# Patient Record
Sex: Female | Born: 1960
Health system: Southern US, Community
[De-identification: ages and names within clinical notes are randomized; demographics above are authoritative.]

## PROBLEM LIST (undated history)

## (undated) DIAGNOSIS — F32A Depression, unspecified: Secondary | ICD-10-CM

## (undated) DIAGNOSIS — Z974 Presence of external hearing-aid: Secondary | ICD-10-CM

## (undated) DIAGNOSIS — Z860101 Personal history of adenomatous and serrated colon polyps: Secondary | ICD-10-CM

## (undated) DIAGNOSIS — R748 Abnormal levels of other serum enzymes: Secondary | ICD-10-CM

## (undated) DIAGNOSIS — F319 Bipolar disorder, unspecified: Secondary | ICD-10-CM

## (undated) DIAGNOSIS — G5602 Carpal tunnel syndrome, left upper limb: Secondary | ICD-10-CM

## (undated) DIAGNOSIS — K8020 Calculus of gallbladder without cholecystitis without obstruction: Secondary | ICD-10-CM

## (undated) DIAGNOSIS — K760 Fatty (change of) liver, not elsewhere classified: Secondary | ICD-10-CM

## (undated) DIAGNOSIS — R5381 Other malaise: Secondary | ICD-10-CM

## (undated) DIAGNOSIS — M4802 Spinal stenosis, cervical region: Secondary | ICD-10-CM

## (undated) DIAGNOSIS — G629 Polyneuropathy, unspecified: Secondary | ICD-10-CM

## (undated) DIAGNOSIS — E119 Type 2 diabetes mellitus without complications: Secondary | ICD-10-CM

## (undated) DIAGNOSIS — I10 Essential (primary) hypertension: Secondary | ICD-10-CM

## (undated) DIAGNOSIS — R0789 Other chest pain: Secondary | ICD-10-CM

## (undated) DIAGNOSIS — F329 Major depressive disorder, single episode, unspecified: Secondary | ICD-10-CM

## (undated) DIAGNOSIS — K219 Gastro-esophageal reflux disease without esophagitis: Secondary | ICD-10-CM

## (undated) DIAGNOSIS — E785 Hyperlipidemia, unspecified: Secondary | ICD-10-CM

## (undated) DIAGNOSIS — M1990 Unspecified osteoarthritis, unspecified site: Secondary | ICD-10-CM

## (undated) DIAGNOSIS — J189 Pneumonia, unspecified organism: Secondary | ICD-10-CM

## (undated) DIAGNOSIS — F419 Anxiety disorder, unspecified: Secondary | ICD-10-CM

## (undated) DIAGNOSIS — H9190 Unspecified hearing loss, unspecified ear: Secondary | ICD-10-CM

## (undated) DIAGNOSIS — Z86010 Personal history of colonic polyps: Principal | ICD-10-CM

## (undated) DIAGNOSIS — R5383 Other fatigue: Secondary | ICD-10-CM

## (undated) DIAGNOSIS — E669 Obesity, unspecified: Secondary | ICD-10-CM

## (undated) HISTORY — DX: Essential (primary) hypertension: I10

## (undated) HISTORY — PX: UPPER GASTROINTESTINAL ENDOSCOPY: SHX188

## (undated) HISTORY — DX: Type 2 diabetes mellitus without complications: E11.9

## (undated) HISTORY — DX: Calculus of gallbladder without cholecystitis without obstruction: K80.20

## (undated) HISTORY — DX: Other malaise: R53.81

## (undated) HISTORY — DX: Carpal tunnel syndrome, left upper limb: G56.02

## (undated) HISTORY — DX: Major depressive disorder, single episode, unspecified: F32.9

## (undated) HISTORY — DX: Pneumonia, unspecified organism: J18.9

## (undated) HISTORY — DX: Personal history of colonic polyps: Z86.010

## (undated) HISTORY — DX: Gastro-esophageal reflux disease without esophagitis: K21.9

## (undated) HISTORY — DX: Hyperlipidemia, unspecified: E78.5

## (undated) HISTORY — DX: Anxiety disorder, unspecified: F41.9

## (undated) HISTORY — DX: Depression, unspecified: F32.A

## (undated) HISTORY — DX: Other chest pain: R07.89

## (undated) HISTORY — DX: Obesity, unspecified: E66.9

## (undated) HISTORY — DX: Other malaise: R53.83

## (undated) HISTORY — PX: POLYPECTOMY: SHX149

## (undated) HISTORY — DX: Unspecified osteoarthritis, unspecified site: M19.90

---

## 1996-04-04 HISTORY — PX: TUBAL LIGATION: SHX77

## 1998-04-04 HISTORY — PX: CHOLECYSTECTOMY: SHX55

## 2000-01-04 ENCOUNTER — Other Ambulatory Visit: Admission: RE | Admit: 2000-01-04 | Discharge: 2000-01-04 | Payer: Self-pay | Admitting: *Deleted

## 2000-10-27 ENCOUNTER — Ambulatory Visit (HOSPITAL_COMMUNITY): Admission: RE | Admit: 2000-10-27 | Discharge: 2000-10-27 | Payer: Self-pay | Admitting: Gastroenterology

## 2000-10-27 ENCOUNTER — Encounter: Payer: Self-pay | Admitting: Gastroenterology

## 2000-11-07 ENCOUNTER — Encounter: Payer: Self-pay | Admitting: Surgery

## 2000-11-07 ENCOUNTER — Ambulatory Visit (HOSPITAL_COMMUNITY): Admission: RE | Admit: 2000-11-07 | Discharge: 2000-11-07 | Payer: Self-pay | Admitting: Surgery

## 2000-11-27 ENCOUNTER — Observation Stay (HOSPITAL_COMMUNITY): Admission: RE | Admit: 2000-11-27 | Discharge: 2000-11-28 | Payer: Self-pay | Admitting: Surgery

## 2000-11-27 ENCOUNTER — Encounter (INDEPENDENT_AMBULATORY_CARE_PROVIDER_SITE_OTHER): Payer: Self-pay

## 2001-07-24 ENCOUNTER — Other Ambulatory Visit: Admission: RE | Admit: 2001-07-24 | Discharge: 2001-07-24 | Payer: Self-pay | Admitting: *Deleted

## 2002-01-21 ENCOUNTER — Other Ambulatory Visit (HOSPITAL_COMMUNITY): Admission: RE | Admit: 2002-01-21 | Discharge: 2002-02-01 | Payer: Self-pay | Admitting: Psychiatry

## 2008-01-04 ENCOUNTER — Emergency Department (HOSPITAL_COMMUNITY): Admission: EM | Admit: 2008-01-04 | Discharge: 2008-01-04 | Payer: Self-pay | Admitting: Emergency Medicine

## 2008-01-06 ENCOUNTER — Emergency Department (HOSPITAL_COMMUNITY): Admission: EM | Admit: 2008-01-06 | Discharge: 2008-01-06 | Payer: Self-pay | Admitting: Family Medicine

## 2008-07-31 ENCOUNTER — Encounter (INDEPENDENT_AMBULATORY_CARE_PROVIDER_SITE_OTHER): Payer: Self-pay | Admitting: *Deleted

## 2008-09-02 ENCOUNTER — Ambulatory Visit: Payer: Self-pay | Admitting: Family Medicine

## 2008-09-02 ENCOUNTER — Encounter: Payer: Self-pay | Admitting: Family Medicine

## 2008-09-02 DIAGNOSIS — F319 Bipolar disorder, unspecified: Secondary | ICD-10-CM | POA: Insufficient documentation

## 2008-09-02 DIAGNOSIS — R5383 Other fatigue: Secondary | ICD-10-CM

## 2008-09-02 DIAGNOSIS — R5381 Other malaise: Secondary | ICD-10-CM | POA: Insufficient documentation

## 2008-09-02 LAB — CONVERTED CEMR LAB
BUN: 10 mg/dL (ref 6–23)
CO2: 22 meq/L (ref 19–32)
Calcium: 10.1 mg/dL (ref 8.4–10.5)
Chloride: 104 meq/L (ref 96–112)
Creatinine, Ser: 0.67 mg/dL (ref 0.40–1.20)
Glucose, Bld: 87 mg/dL (ref 70–99)
HCT: 38 % (ref 36.0–46.0)
Hemoglobin: 13.1 g/dL (ref 12.0–15.0)
MCHC: 34.5 g/dL (ref 30.0–36.0)
MCV: 87.2 fL (ref 78.0–100.0)
Platelets: 282 10*3/uL (ref 150–400)
Potassium: 4 meq/L (ref 3.5–5.3)
RBC: 4.36 M/uL (ref 3.87–5.11)
RDW: 12.5 % (ref 11.5–15.5)
Sodium: 139 meq/L (ref 135–145)
TSH: 3.463 microintl units/mL (ref 0.350–4.500)
WBC: 8.7 10*3/uL (ref 4.0–10.5)

## 2008-09-04 ENCOUNTER — Encounter: Payer: Self-pay | Admitting: Family Medicine

## 2008-10-23 ENCOUNTER — Ambulatory Visit: Payer: Self-pay | Admitting: Family Medicine

## 2008-10-23 DIAGNOSIS — I10 Essential (primary) hypertension: Secondary | ICD-10-CM | POA: Insufficient documentation

## 2008-10-23 DIAGNOSIS — E669 Obesity, unspecified: Secondary | ICD-10-CM | POA: Insufficient documentation

## 2010-05-06 ENCOUNTER — Encounter: Payer: Self-pay | Admitting: *Deleted

## 2010-08-10 ENCOUNTER — Inpatient Hospital Stay (INDEPENDENT_AMBULATORY_CARE_PROVIDER_SITE_OTHER)
Admission: RE | Admit: 2010-08-10 | Discharge: 2010-08-10 | Disposition: A | Discharge status: Home / Self Care | Payer: Self-pay | Source: Ambulatory Visit | Attending: Family Medicine | Admitting: Family Medicine

## 2010-08-10 DIAGNOSIS — R209 Unspecified disturbances of skin sensation: Secondary | ICD-10-CM

## 2010-08-10 LAB — POCT I-STAT, CHEM 8
BUN: 7 mg/dL (ref 6–23)
Calcium, Ion: 1.21 mmol/L (ref 1.12–1.32)
Chloride: 104 mEq/L (ref 96–112)
Creatinine, Ser: 0.6 mg/dL (ref 0.4–1.2)
Glucose, Bld: 88 mg/dL (ref 70–99)
HCT: 39 % (ref 36.0–46.0)
Hemoglobin: 13.3 g/dL (ref 12.0–15.0)
Potassium: 3.2 mEq/L — ABNORMAL LOW (ref 3.5–5.1)
Sodium: 138 mEq/L (ref 135–145)
TCO2: 23 mmol/L (ref 0–100)

## 2010-08-20 NOTE — Op Note (Signed)
[facility]  Patient:    Claudia Gregory, Claudia Gregory Visit Number: [id number] MRN: [id number]          Service Type: SUR Location: [id number] Attending Physician:  Shelly Rubenstein Proc. Date: 11/27/00 Adm. Date:  11/27/2000                             Operative Report  PREOPERATIVE DIAGNOSIS:  Symptomatic cholelithiasis.  POSTOPERATIVE DIAGNOSIS:  Symptomatic cholelithiasis.  PROCEDURE:  Laparoscopic cholecystectomy.  SURGEON:  Abigail Miyamoto, M.D.  ASSISTANT:  Donnie Coffin. Samuella Cota, M.D.  ANESTHESIA:  General endotracheal.  ESTIMATED BLOOD LOSS:  Minimal.  DESCRIPTION OF PROCEDURE:  The patient was brought to the operating room and identified as [name].  She was placed supine on the operating room table, and general anesthesia was induced.  Next, her abdomen was then prepped and draped in the usual sterile fashion.  Using the #15 blade, a small, transverse incision was made in the umbilicus through the patients previous incision. Incision was carried through the fascia which was then opened with the scalpel.  A hemostat was then used to pass into the peritoneal cavity.  Next, a 0 Vicryl pursestring suture was placed around the fascial opening.  The Hasson port was then placed through the opening, and insufflation of the abdomen was begun.  Next, an 11 mm port was placed in the patients epigastrium, and two 5 mm ports were placed in the patients right flank under direct vision.  The gallbladder was identified and grasped and retracted above the liver bed.  Several adhesions to the gallbladder were then taken down bluntly.  The cystic duct was then dissected out and clipped three times proximally and once distally and transected with the scissors.  The cystic artery was then identified and clipped twice proximally and once distally and transected as well.  The gallbladder was then slowly dissected free from the liver bed.  A small branching vessel in the  liver bed near the dome of the gallbladder was stapled as well.  Once the gallbladder was completely excised from the liver bed, hemostasis appeared to be achieved.  The gallbladder was then removed through the incision at the umbilicus.  The 0 Vicryl at the umbilicus was then tied in place, closing the fascial defect.  The abdomen was then irrigated with normal saline.  Again, hemostasis appeared to be achieved. All ports were then removed under direct vision, and the abdomen was deflated. All incisions were then anesthetized with 0.25% Marcaine and then closed with 4-0 Monocryl subcuticular sutures.  Steri-Strips, gauze, and Tegaderm were then applied.  The patient tolerated the procedure well.  All sponge, needle and instrument counts were correct at the end of the procedure.  The patient was then extubated in the operating room and taken in stable condition to the recovery room. Attending Physician:  Shelly Rubenstein DD:  11/27/00 TD:  11/27/00 Job: [id number]

## 2011-07-15 ENCOUNTER — Encounter (HOSPITAL_COMMUNITY): Payer: Self-pay | Admitting: *Deleted

## 2011-07-15 ENCOUNTER — Emergency Department (HOSPITAL_COMMUNITY)
Admission: EM | Admit: 2011-07-15 | Discharge: 2011-07-15 | Disposition: A | Discharge status: Home / Self Care | Payer: 59 | Attending: Emergency Medicine | Admitting: Emergency Medicine

## 2011-07-15 ENCOUNTER — Emergency Department (HOSPITAL_COMMUNITY): Payer: 59

## 2011-07-15 DIAGNOSIS — M25519 Pain in unspecified shoulder: Secondary | ICD-10-CM | POA: Insufficient documentation

## 2011-07-15 DIAGNOSIS — I498 Other specified cardiac arrhythmias: Secondary | ICD-10-CM | POA: Insufficient documentation

## 2011-07-15 DIAGNOSIS — F439 Reaction to severe stress, unspecified: Secondary | ICD-10-CM

## 2011-07-15 DIAGNOSIS — R079 Chest pain, unspecified: Secondary | ICD-10-CM | POA: Insufficient documentation

## 2011-07-15 DIAGNOSIS — Z733 Stress, not elsewhere classified: Secondary | ICD-10-CM | POA: Insufficient documentation

## 2011-07-15 DIAGNOSIS — I10 Essential (primary) hypertension: Secondary | ICD-10-CM | POA: Insufficient documentation

## 2011-07-15 DIAGNOSIS — H9190 Unspecified hearing loss, unspecified ear: Secondary | ICD-10-CM | POA: Insufficient documentation

## 2011-07-15 HISTORY — DX: Unspecified hearing loss, unspecified ear: H91.90

## 2011-07-15 LAB — POCT I-STAT, CHEM 8
BUN: 10 mg/dL (ref 6–23)
Calcium, Ion: 1.33 mmol/L — ABNORMAL HIGH (ref 1.12–1.32)
Chloride: 105 mEq/L (ref 96–112)
Glucose, Bld: 97 mg/dL (ref 70–99)
TCO2: 25 mmol/L (ref 0–100)

## 2011-07-15 LAB — CBC
HCT: 34.7 % — ABNORMAL LOW (ref 36.0–46.0)
MCH: 29.8 pg (ref 26.0–34.0)
MCV: 84.6 fL (ref 78.0–100.0)
Platelets: 273 10*3/uL (ref 150–400)
RDW: 12.3 % (ref 11.5–15.5)
WBC: 7.1 10*3/uL (ref 4.0–10.5)

## 2011-07-15 LAB — POCT I-STAT TROPONIN I: Troponin i, poc: 0.02 ng/mL (ref 0.00–0.08)

## 2011-07-15 MED ORDER — ZOLPIDEM TARTRATE 5 MG PO TABS: 5.0000 mg | ORAL_TABLET | Freq: Every evening | ORAL | Status: DC | PRN

## 2011-07-15 MED ORDER — HYDROCHLOROTHIAZIDE 25 MG PO TABS: 25.0000 mg | ORAL_TABLET | Freq: Every day | ORAL | Status: DC

## 2011-07-15 NOTE — ED Notes (Signed)
Recently two family members past away and son was arrested one week ago. States been under a lot of stress and last week right sided chest pain radiating to center of chest and full feeling in neck and face with headache. Continued this week and pain currently 5/10 pressure. Airway intact bilateral equal chest rise and fall.  Denies shortness of breath. Resting comfortably on stretcher.  Boyfriend at bedside along with interpretor because patient is deaf.  Patient states unknown if she always has a low heart rate.

## 2011-07-15 NOTE — ED Provider Notes (Signed)
History     CSN: [id number]  Arrival date & time 07/15/11  1409   First MD Initiated Contact with Patient 07/15/11 1956      Chief Complaint  Patient presents with  . Chest Pain  . Headache  . Hypertension  . Blurred Vision     HPI Pt reports right sided chest pain radiating into right shoulder with sob and feeling of being flushed. Pt reports headache, increased blood pressure, and and blurred vision. Stroke scale negative. Pt reports a lot of stress in her life lately. Pt is deaf and pen and paper used to communicate with pt. Pt is aware of poc  Past Medical History  Diagnosis Date  . Deaf     History reviewed. No pertinent past surgical history.  History reviewed. No pertinent family history.  History  Substance Use Topics  . Smoking status: Never Smoker   . Smokeless tobacco: Not on file  . Alcohol Use: No    OB History    Grav Para Term Preterm Abortions TAB SAB Ect Mult Living                  Review of Systems  Unable to perform ROS: Other  All other systems reviewed and are negative.    Allergies  Review of patient's allergies indicates no known allergies.  Home Medications   Current Outpatient Rx  Name Route Sig Dispense Refill  . BUPROPION HCL ER (SR) 150 MG PO TB12 Oral Take 150 mg by mouth 2 (two) times daily.    Marland Kitchen CLONAZEPAM 1 MG PO TABS Oral Take 1 mg by mouth 4 (four) times daily as needed. For anxiety    . FLUOXETINE HCL 10 MG PO CAPS Oral Take 20 mg by mouth daily.    Marland Kitchen RISPERIDONE 1 MG PO TABS Oral Take 1 mg by mouth at bedtime.    Marland Kitchen HYDROCHLOROTHIAZIDE 25 MG PO TABS Oral Take 1 tablet (25 mg total) by mouth daily. 30 tablet 0  . ZOLPIDEM TARTRATE 5 MG PO TABS Oral Take 1 tablet (5 mg total) by mouth at bedtime as needed for sleep. 15 tablet 0    BP 164/84  Pulse 50  Temp(Src) 97.1 F (36.2 C) (Oral)  Resp 16  Wt 173 lb 6 oz (78.642 kg)  SpO2 99%  Physical Exam  Nursing note and vitals reviewed. Constitutional: She is oriented  to person, place, and time. She appears well-developed and well-nourished. No distress.  HENT:  Head: Normocephalic and atraumatic.  Eyes: Pupils are equal, round, and reactive to light.  Neck: Normal range of motion.  Cardiovascular: Intact distal pulses.  Bradycardia present.        Sinus bradycardia Rate =54 Possible Anterior infarct , age undetermined Abnormal ECG No previous ECG  Pulmonary/Chest: No respiratory distress.  Abdominal: Normal appearance. She exhibits no distension.  Musculoskeletal: Normal range of motion.  Neurological: She is alert and oriented to person, place, and time. She has normal strength. No cranial nerve deficit or sensory deficit. GCS eye subscore is 4. GCS verbal subscore is 5. GCS motor subscore is 6.  Skin: Skin is warm and dry. No rash noted.  Psychiatric: She has a normal mood and affect. Her behavior is normal.    ED Course  Procedures (including critical care time)  Labs Reviewed  CBC - Abnormal; Notable for the following:    HCT 34.7 (*)    All other components within normal limits  POCT I-STAT, CHEM 8 -  Abnormal; Notable for the following:    Calcium, Ion 1.33 (*)    Hemoglobin 11.9 (*)    HCT 35.0 (*)    All other components within normal limits  POCT I-STAT TROPONIN I   Dg Chest 2 View  07/15/2011  *RADIOLOGY REPORT*  Clinical Data: 51 year old female with chest pain, headache, blurred vision, right shoulder pain.  CHEST - 2 VIEW  Comparison: 01/04/2008.  Findings: Interval clearance of the patchy bilateral airspace disease seen on the previous exam.  Better lung volumes.  Cardiac size remains at the upper limits of normal. Other mediastinal contours are within normal limits.  Visualized tracheal air column is within normal limits.  No pneumothorax, pulmonary edema, pleural effusion or confluent pulmonary opacity.  Right upper quadrant surgical clips.  Negative visualized bowel gas. No acute osseous abnormality identified.  IMPRESSION: No  acute cardiopulmonary abnormality.  Original Report Authenticated By: Harley Hallmark, M.D.     1. Hypertension   2. Stress       MDM         Nelia Shi, MD 07/15/11 2007

## 2011-07-15 NOTE — ED Notes (Signed)
Patient is deaf,  She went to pediatrics before the adult triage.  She has returned to be triaged.  We have called for a deaf interpreter

## 2011-07-15 NOTE — ED Notes (Signed)
PT ambulated with a steady gait; VSS; A&Ox3; no signs of distress; respirations even and unlabored; skin warm and dry. NO questions at this time.  

## 2011-07-15 NOTE — ED Notes (Signed)
PT reports only a headache. NO chest pain at this time.

## 2011-07-15 NOTE — ED Notes (Signed)
FAMILY MEMBER AUNT Olegario Messier [phone] CALL FOR UPDATES.

## 2011-07-15 NOTE — Discharge Instructions (Signed)
Arterial Hypertension Arterial hypertension (high blood pressure) is a condition of elevated pressure in your blood vessels. Hypertension over a long period of time is a risk factor for strokes, heart attacks, and heart failure. It is also the leading cause of kidney (renal) failure.  CAUSES   In Adults -- Over 90% of all hypertension has no known cause. This is called essential or primary hypertension. In the other 10% of people with hypertension, the increase in blood pressure is caused by another disorder. This is called secondary hypertension. Important causes of secondary hypertension are:   Heavy alcohol use.   Obstructive sleep apnea.   Hyperaldosterosim (Conn's syndrome).   Steroid use.   Chronic kidney failure.   Hyperparathyroidism.   Medications.   Renal artery stenosis.   Pheochromocytoma.   Cushing's disease.   Coarctation of the aorta.   Scleroderma renal crisis.   Licorice (in excessive amounts).   Drugs (cocaine, methamphetamine).  Your caregiver can explain any items above that apply to you.  In Children -- Secondary hypertension is more common and should always be considered.   Pregnancy -- Few women of childbearing age have high blood pressure. However, up to 10% of them develop hypertension of pregnancy. Generally, this will not harm the woman. It may be a sign of 3 complications of pregnancy: preeclampsia, HELLP syndrome, and eclampsia. Follow up and control with medication is necessary.  SYMPTOMS   This condition normally does not produce any noticeable symptoms. It is usually found during a routine exam.   Malignant hypertension is a late problem of high blood pressure. It may have the following symptoms:   Headaches.   Blurred vision.   End-organ damage (this means your kidneys, heart, lungs, and other organs are being damaged).   Stressful situations can increase the blood pressure. If a person with normal blood pressure has their blood  pressure go up while being seen by their caregiver, this is often termed "white coat hypertension." Its importance is not known. It may be related with eventually developing hypertension or complications of hypertension.   Hypertension is often confused with mental tension, stress, and anxiety.  DIAGNOSIS  The diagnosis is made by 3 separate blood pressure measurements. They are taken at least 1 week apart from each other. If there is organ damage from hypertension, the diagnosis may be made without repeat measurements. Hypertension is usually identified by having blood pressure readings:  Above 140/90 mmHg measured in both arms, at 3 separate times, over a couple weeks.   Over 130/80 mmHg should be considered a risk factor and may require treatment in patients with diabetes.  Blood pressure readings over 120/80 mmHg are called "pre-hypertension" even in non-diabetic patients. To get a true blood pressure measurement, use the following guidelines. Be aware of the factors that can alter blood pressure readings.  Take measurements at least 1 hour after caffeine.   Take measurements 30 minutes after smoking and without any stress. This is another reason to quit smoking - it raises your blood pressure.   Use a proper cuff size. Ask your caregiver if you are not sure about your cuff size.   Most home blood pressure cuffs are automatic. They will measure systolic and diastolic pressures. The systolic pressure is the pressure reading at the start of sounds. Diastolic pressure is the pressure at which the sounds disappear. If you are elderly, measure pressures in multiple postures. Try sitting, lying or standing.   Sit at rest for a minimum of   5 minutes before taking measurements.   You should not be on any medications like decongestants. These are found in many cold medications.   Record your blood pressure readings and review them with your caregiver.  If you have hypertension:  Your caregiver  may do tests to be sure you do not have secondary hypertension (see "causes" above).   Your caregiver may also look for signs of metabolic syndrome. This is also called Syndrome X or Insulin Resistance Syndrome. You may have this syndrome if you have type 2 diabetes, abdominal obesity, and abnormal blood lipids in addition to hypertension.   Your caregiver will take your medical and family history and perform a physical exam.   Diagnostic tests may include blood tests (for glucose, cholesterol, potassium, and kidney function), a urinalysis, or an EKG. Other tests may also be necessary depending on your condition.  PREVENTION  There are important lifestyle issues that you can adopt to reduce your chance of developing hypertension:  Maintain a normal weight.   Limit the amount of salt (sodium) in your diet.   Exercise often.   Limit alcohol intake.   Get enough potassium in your diet. Discuss specific advice with your caregiver.   Follow a DASH diet (dietary approaches to stop hypertension). This diet is rich in fruits, vegetables, and low-fat dairy products, and avoids certain fats.  PROGNOSIS  Essential hypertension cannot be cured. Lifestyle changes and medical treatment can lower blood pressure and reduce complications. The prognosis of secondary hypertension depends on the underlying cause. Many people whose hypertension is controlled with medicine or lifestyle changes can live a normal, healthy life.  RISKS AND COMPLICATIONS  While high blood pressure alone is not an illness, it often requires treatment due to its short- and long-term effects on many organs. Hypertension increases your risk for:  CVAs or strokes (cerebrovascular accident).   Heart failure due to chronically high blood pressure (hypertensive cardiomyopathy).   Heart attack (myocardial infarction).   Damage to the retina (hypertensive retinopathy).   Kidney failure (hypertensive nephropathy).  Your caregiver can  explain list items above that apply to you. Treatment of hypertension can significantly reduce the risk of complications. TREATMENT   For overweight patients, weight loss and regular exercise are recommended. Physical fitness lowers blood pressure.   Mild hypertension is usually treated with diet and exercise. A diet rich in fruits and vegetables, fat-free dairy products, and foods low in fat and salt (sodium) can help lower blood pressure. Decreasing salt intake decreases blood pressure in a 1/3 of people.   Stop smoking if you are a smoker.  The steps above are highly effective in reducing blood pressure. While these actions are easy to suggest, they are difficult to achieve. Most patients with moderate or severe hypertension end up requiring medications to bring their blood pressure down to a normal level. There are several classes of medications for treatment. Blood pressure pills (antihypertensives) will lower blood pressure by their different actions. Lowering the blood pressure by 10 mmHg may decrease the risk of complications by as much as 25%. The goal of treatment is effective blood pressure control. This will reduce your risk for complications. Your caregiver will help you determine the best treatment for you according to your lifestyle. What is excellent treatment for one person, may not be for you. HOME CARE INSTRUCTIONS   Do not smoke.   Follow the lifestyle changes outlined in the "Prevention" section.   If you are on medications, follow the directions   carefully. Blood pressure medications must be taken as prescribed. Skipping doses reduces their benefit. It also puts you at risk for problems.   Follow up with your caregiver, as directed.   If you are asked to monitor your blood pressure at home, follow the guidelines in the "Diagnosis" section above.  SEEK MEDICAL CARE IF:   You think you are having medication side effects.   You have recurrent headaches or lightheadedness.     You have swelling in your ankles.   You have trouble with your vision.  SEEK IMMEDIATE MEDICAL CARE IF:   You have sudden onset of chest pain or pressure, difficulty breathing, or other symptoms of a heart attack.   You have a severe headache.   You have symptoms of a stroke (such as sudden weakness, difficulty speaking, difficulty walking).  MAKE SURE YOU:   Understand these instructions.   Will watch your condition.   Will get help right away if you are not doing well or get worse.  Document Released: 03/21/2005 Document Revised: 03/10/2011 Document Reviewed: 10/19/2006 [employer] Patient Information 2012 [employer]. 

## 2011-07-15 NOTE — ED Notes (Signed)
Pt reports right sided chest pain radiating into right shoulder with sob and feeling of being flushed. Pt reports headache, increased blood pressure, and and blurred vision. Stroke scale negative. Pt reports a lot of stress in her life lately. Pt is deaf and pen and paper used to communicate with pt. Pt is aware of poc.

## 2011-08-10 ENCOUNTER — Ambulatory Visit: Payer: 59 | Admitting: Family Medicine

## 2011-08-12 ENCOUNTER — Ambulatory Visit (INDEPENDENT_AMBULATORY_CARE_PROVIDER_SITE_OTHER): Payer: 59 | Admitting: Family Medicine

## 2011-08-12 ENCOUNTER — Encounter: Payer: Self-pay | Admitting: Family Medicine

## 2011-08-12 VITALS — BP 132/90 | HR 64 | Temp 98.4°F | Ht 66.0 in | Wt 171.0 lb

## 2011-08-12 DIAGNOSIS — R030 Elevated blood-pressure reading, without diagnosis of hypertension: Secondary | ICD-10-CM

## 2011-08-12 DIAGNOSIS — G5602 Carpal tunnel syndrome, left upper limb: Secondary | ICD-10-CM

## 2011-08-12 DIAGNOSIS — R0789 Other chest pain: Secondary | ICD-10-CM

## 2011-08-12 DIAGNOSIS — G5600 Carpal tunnel syndrome, unspecified upper limb: Secondary | ICD-10-CM

## 2011-08-12 DIAGNOSIS — F319 Bipolar disorder, unspecified: Secondary | ICD-10-CM

## 2011-08-12 MED ORDER — HYDROCHLOROTHIAZIDE 25 MG PO TABS: 25.0000 mg | ORAL_TABLET | Freq: Every day | ORAL | Status: DC

## 2011-08-12 NOTE — Assessment & Plan Note (Signed)
Mood disorder slightly worsening with her mother's death.  Plan to encourage continued counseling and followup at [facility]. Discussed suicidal precautions. Patient expresses understanding

## 2011-08-12 NOTE — Progress Notes (Signed)
Claudia Gregory is a 51 y.o. female who presents to [facility] today for   1) chest pain followup: Patient had right-sided chest pain for about one month and was recently seen in the emergency room. She had an EKG and negative cardiac enzymes.  She notes that she has been emotionally worse recently. Her mother died in 2022/06/08 and mother state is coming up.  He notes that she experienced chart right-sided nonradiating chest pain after crying. She denies any exertion palpitations or trouble breathing associated with chest pain.  She currently is taking hydrochlorothiazide for blood pressure.   2) mood: Patient has bipolar disorder which is currently treated with the medications listed below at [facility].  She notes that her mood has been more depressed recently after her mother's death.  She denies any suicidal or homicidal ideation. She goes to therapy on Mondays at [facility].  She has not been for the last 2 weeks.    3) numbness of the left fingers: Patient notes occasional numb tingling feeling of the left fingertips of the second, third, and fourth digits.  She denies any weakness in her hand or pain. She notes the pain after driving for an extended period of time. She works doing Estate manager/land agent work.  She has not been evaluated for this in the past. Of note the patient is deaf and uses American sign language to communicate.  PMH: Reviewed significant for bipolar, hypertension, deafness History  Substance Use Topics  . Smoking status: Never Smoker   . Smokeless tobacco: Not on file  . Alcohol Use: No   ROS as above  Medications reviewed. Current Outpatient Prescriptions  Medication Sig Dispense Refill  . buPROPion (WELLBUTRIN SR) 150 MG 12 hr tablet Take 150 mg by mouth 2 (two) times daily.      . clonazePAM (KLONOPIN) 1 MG tablet Take 1 mg by mouth 4 (four) times daily as needed. For anxiety      . FLUoxetine (PROZAC) 10 MG capsule Take 20 mg by mouth daily.      . hydrochlorothiazide (HYDRODIURIL)  25 MG tablet Take 1 tablet (25 mg total) by mouth daily.  30 tablet  6  . risperiDONE (RISPERDAL) 1 MG tablet Take 1 mg by mouth at bedtime.      Marland Kitchen DISCONTD: hydrochlorothiazide (HYDRODIURIL) 25 MG tablet Take 1 tablet (25 mg total) by mouth daily.  30 tablet  0  . zolpidem (AMBIEN) 5 MG tablet Take 1 tablet (5 mg total) by mouth at bedtime as needed for sleep.  15 tablet  0    Exam:  BP 132/90  Pulse 64  Temp(Src) 98.4 F (36.9 C) (Oral)  Ht 5\' 6"  (1.676 m)  Wt 171 lb (77.565 kg)  BMI 27.60 kg/m2 Gen: Well NAD HEENT: EOMI,  MMM Lungs: CTABL Nl WOB Heart: RRR no MRG Abd: NABS, NT, ND Exts: Non edematous BL  LE, warm and well perfused. Positive Tinel's and Phalen's test of the left hand.  Strength and motion is intact.  Data reviewed below from April 12   Chemistry      Component Value Date/Time   NA 139 07/15/2011 1547   K 3.9 07/15/2011 1547   CL 105 07/15/2011 1547   CO2 22 09/02/2008 2246   BUN 10 07/15/2011 1547   CREATININE 0.70 07/15/2011 1547      Component Value Date/Time   CALCIUM 10.1 09/02/2008 2246     Point-of-care troponin 0.02. Hemoglobin 11.9. EKG: Sinus bradycardia with possible old inferior infarct.  Dg Chest 2 View  07/15/2011  *RADIOLOGY REPORT*  Clinical Data: 51 year old female with chest pain, headache, blurred vision, right shoulder pain.  CHEST - 2 VIEW  Comparison: 01/04/2008.  Findings: Interval clearance of the patchy bilateral airspace disease seen on the previous exam.  Better lung volumes.  Cardiac size remains at the upper limits of normal. Other mediastinal contours are within normal limits.  Visualized tracheal air column is within normal limits.  No pneumothorax, pulmonary edema, pleural effusion or confluent pulmonary opacity.  Right upper quadrant surgical clips.  Negative visualized bowel gas. No acute osseous abnormality identified.  IMPRESSION: No acute cardiopulmonary abnormality.  Original Report Authenticated By: Harley Hallmark, M.D.

## 2011-08-12 NOTE — Patient Instructions (Signed)
Thank you for coming in today. We will be in touch about your cardiology appointment. Please make an appointment for the lab next week for fasting lab.  Do not eat or drink anything other than water before you come.  Please return within 3 months for a new appointment.  Come back sooner if you get worse.  I would recommend that you continue going to counseling.  I think your hand numbness is due to carpal tunnel syndrome.  We can treat it if it gets worse. Call or go to the emergency room if you get worse, have trouble breathing, have chest pains, or palpitations.   Chest Pain (Nonspecific) It is often hard to give a specific diagnosis for the cause of chest pain. There is always a chance that your pain could be related to something serious, such as a heart attack or a blood clot in the lungs. You need to follow up with your caregiver for further evaluation. CAUSES    Heartburn.   Pneumonia or bronchitis.   Anxiety or stress.   Inflammation around your heart (pericarditis) or lung (pleuritis or pleurisy).   A blood clot in the lung.   A collapsed lung (pneumothorax). It can develop suddenly on its own (spontaneous pneumothorax) or from injury (trauma) to the chest.   Shingles infection (herpes zoster virus).  The chest wall is composed of bones, muscles, and cartilage. Any of these can be the source of the pain.  The bones can be bruised by injury.   The muscles or cartilage can be strained by coughing or overwork.   The cartilage can be affected by inflammation and become sore (costochondritis).  DIAGNOSIS   Lab tests or other studies, such as X-rays, electrocardiography, stress testing, or cardiac imaging, may be needed to find the cause of your pain.   TREATMENT    Treatment depends on what may be causing your chest pain. Treatment may include:   Acid blockers for heartburn.   Anti-inflammatory medicine.   Pain medicine for inflammatory conditions.   Antibiotics if an  infection is present.   You may be advised to change lifestyle habits. This includes stopping smoking and avoiding alcohol, caffeine, and chocolate.   You may be advised to keep your head raised (elevated) when sleeping. This reduces the chance of acid going backward from your stomach into your esophagus.   Most of the time, nonspecific chest pain will improve within 2 to 3 days with rest and mild pain medicine.  HOME CARE INSTRUCTIONS    If antibiotics were prescribed, take your antibiotics as directed. Finish them even if you start to feel better.   For the next few days, avoid physical activities that bring on chest pain. Continue physical activities as directed.   Do not smoke.   Avoid drinking alcohol.   Only take over-the-counter or prescription medicine for pain, discomfort, or fever as directed by your caregiver.   Follow your caregiver's suggestions for further testing if your chest pain does not go away.   Keep any follow-up appointments you made. If you do not go to an appointment, you could develop lasting (chronic) problems with pain. If there is any problem keeping an appointment, you must call to reschedule.  SEEK MEDICAL CARE IF:    You think you are having problems from the medicine you are taking. Read your medicine instructions carefully.   Your chest pain does not go away, even after treatment.   You develop a rash with blisters on  your chest.  SEEK IMMEDIATE MEDICAL CARE IF:    You have increased chest pain or pain that spreads to your arm, neck, jaw, back, or abdomen.   You develop shortness of breath, an increasing cough, or you are coughing up blood.   You have severe back or abdominal pain, feel nauseous, or vomit.   You develop severe weakness, fainting, or chills.   You have a fever.  THIS IS AN EMERGENCY. Do not wait to see if the pain will go away. Get medical help at once. Call your local emergency services (911 in [place] Do not drive yourself to  the hospital. MAKE SURE YOU:    Understand these instructions.   Will watch your condition.   Will get help right away if you are not doing well or get worse.  Document Released: 12/29/2004 Document Revised: 03/10/2011 Document Reviewed: 10/25/2007 [facility] Patient Information 2012 [place], [place].  Carpal Tunnel Syndrome The carpal tunnel is a narrow hollow area in the wrist. It is formed by the wrist bones and ligaments. Nerves, blood vessels, and tendons (cord like structures which attach muscle to bone) on the palm side (the side of your hand in the direction your fingers bend) of your hand pass through the carpal tunnel. Repeated wrist motion or certain diseases may cause swelling within the tunnel. (That is why these are called repetitive trauma (damage caused by over use) disorders. It is also a common problem in late pregnancy.) This swelling pinches the main nerve in the wrist (median nerve) and causes the painful condition called carpal tunnel syndrome. A feeling of "pins and needles" may be noticed in the fingers or hand; however, the entire arm may ache from this condition. Carpal tunnel syndrome may clear up by itself. Cortisone injections may help. Sometimes, an operation may be needed to free the pinched nerve. An electromyogram (a type of test) may be needed to confirm this diagnosis (learning what is wrong). This is a test which measures nerve conduction. The nerve conduction is usually slowed in a carpal tunnel syndrome. HOME CARE INSTRUCTIONS    If your caregiver prescribed medication to help reduce swelling, take as directed.   If you were given a splint to keep your wrist from bending, use it as instructed. It is important to wear the splint at night. Use the splint for as long as you have pain or numbness in your hand, arm or wrist. This may take 1 to 2 months.   If you have pain at night, it may help to rub or shake your hand, or elevate your hand above the level of your  heart (the center of your chest).   It is important to give your wrist a rest by stopping the activities that are causing the problem. If your symptoms (problems) are work-related, you may need to talk to your employer about changing to a job that does not require using your wrist.   Only take over-the-counter or prescription medicines for pain, discomfort, or fever as directed by your caregiver.   Following periods of extended use, particularly strenuous use, apply an ice pack wrapped in a towel to the anterior (palm) side of the affected wrist for 20 to 30 minutes. Repeat as needed three to four times per day. This will help reduce the swelling.   Follow all instructions for follow-up with your caregiver. This includes any orthopedic referrals, physical therapy, and rehabilitation. Any delay in obtaining necessary care could result in a delay or failure of  your condition to heal.  SEEK IMMEDIATE MEDICAL CARE IF:    You are still having pain and numbness following a week of treatment.   You develop new, unexplained symptoms.   Your current symptoms are getting worse and are not helped or controlled with medications.  MAKE SURE YOU:    Understand these instructions.   Will watch your condition.   Will get help right away if you are not doing well or get worse.  Document Released: 03/18/2000 Document Revised: 03/10/2011 Document Reviewed: 02/04/2011 [facility] Patient Information 2012 [place], [place].

## 2011-08-12 NOTE — Assessment & Plan Note (Signed)
Patient's blood pressure is well controlled with hydrochlorothiazide. Her potassium level was recently normal in April. Plan to continue hydrochlorothiazide and followup in about 1-3 months

## 2011-08-12 NOTE — Assessment & Plan Note (Signed)
Patient likely has carpal tunnel syndrome of the left wrist.  It appears to be very mild at this time. I feel that using a wrist brace with exacerbating activities as the right approach at this time.  Followup as needed

## 2011-08-12 NOTE — Assessment & Plan Note (Signed)
Patient has what appears to be atypical chest pain worsened with emotional disturbance. She has a mildly abnormal EKG with normal electrolytes and cardiac enzymes.  Her chest exam today is not remarkable. I do feel that she should have further risk factor stratification.  Plan to obtain fasting lipids and refer to cardiology for stress testing.  I'm not sure she would benefit from a nuclear stress test versus exercise stress echo/EKG, therefore feel that referral is beneficial.  Plan to followup with myself in 1-3 months or sooner if worsening

## 2011-09-05 ENCOUNTER — Encounter: Payer: Self-pay | Admitting: *Deleted

## 2011-09-06 ENCOUNTER — Ambulatory Visit (INDEPENDENT_AMBULATORY_CARE_PROVIDER_SITE_OTHER): Payer: 59 | Admitting: Cardiovascular Disease

## 2011-09-06 ENCOUNTER — Encounter: Payer: Self-pay | Admitting: Cardiovascular Disease

## 2011-09-06 VITALS — BP 147/86 | HR 58 | Ht 66.0 in | Wt 173.1 lb

## 2011-09-06 DIAGNOSIS — I10 Essential (primary) hypertension: Secondary | ICD-10-CM

## 2011-09-06 DIAGNOSIS — R079 Chest pain, unspecified: Secondary | ICD-10-CM

## 2011-09-06 DIAGNOSIS — R0789 Other chest pain: Secondary | ICD-10-CM

## 2011-09-06 NOTE — Patient Instructions (Signed)

## 2011-09-06 NOTE — Assessment & Plan Note (Signed)
Stable continue diuretic

## 2011-09-06 NOTE — Progress Notes (Signed)
Patient ID: Claudia Gregory, female   DOB: April 07, 1960, 51 y.o.   MRN: [id number] 51 yo referred by [name] for chest pain.  Patient had right-sided chest pain for about one month and was  Seen in ER 4/13 Notes reviewed  She had an EKG and negative cardiac enzymes. She notes that she has been emotionally worse recently. Her mother died in 2022/06/29 and mother state is coming up. He notes that she experienced chart right-sided nonradiating chest pain after crying. She denies any exertion palpitations or trouble breathing associated with chest pain. She currently is taking hydrochlorothiazide for blood pressure. Had some recurrent atypical right sided pain last week.  No pleuritic component.  NO cough or fever. No recent trauma or arthitides  ROS: Denies fever, malais, weight loss, blurry vision, decreased visual acuity, cough, sputum, SOB, hemoptysis, pleuritic pain, palpitaitons, heartburn, abdominal pain, melena, lower extremity edema, claudication, or rash.  All other systems reviewed and negative  General: Affect appropriate Healthy:  appears stated age HEENT: normal Neck supple with no adenopathy JVP normal no bruits no thyromegaly Lungs clear with no wheezing and good diaphragmatic motion Heart:  S1/S2 no murmur, no rub, gallop or click PMI normal Abdomen: benighn, BS positve, no tenderness, no AAA no bruit.  No HSM or HJR Distal pulses intact with no bruits No edema Neuro non-focal Skin warm and dry No muscular weakness   Current Outpatient Prescriptions  Medication Sig Dispense Refill  . buPROPion (WELLBUTRIN SR) 150 MG 12 hr tablet Take 150 mg by mouth 2 (two) times daily.      . clonazePAM (KLONOPIN) 1 MG tablet Take 1 mg by mouth 4 (four) times daily as needed. For anxiety      . FLUoxetine (PROZAC) 10 MG capsule Take 20 mg by mouth daily.      . hydrochlorothiazide (HYDRODIURIL) 25 MG tablet Take 1 tablet (25 mg total) by mouth daily.  30 tablet  6  . risperiDONE (RISPERDAL) 1  MG tablet Take 1 mg by mouth at bedtime.        Allergies  Review of patient's allergies indicates no known allergies.  Electrocardiogram:  NSR rate 51 poor R wave progression  Assessment and Plan

## 2011-09-06 NOTE — Assessment & Plan Note (Signed)
Doubt cardiac Normal ECG and exam  F/U ETT

## 2011-09-14 ENCOUNTER — Encounter: Payer: 59 | Admitting: Physician Assistant

## 2011-09-21 ENCOUNTER — Encounter: Payer: Self-pay | Admitting: Physician Assistant

## 2011-09-21 ENCOUNTER — Ambulatory Visit (INDEPENDENT_AMBULATORY_CARE_PROVIDER_SITE_OTHER): Payer: 59 | Admitting: Physician Assistant

## 2011-09-21 DIAGNOSIS — R079 Chest pain, unspecified: Secondary | ICD-10-CM

## 2011-09-21 NOTE — Patient Instructions (Addendum)
Your physician has requested that you have a lexiscan myoview DX CHEST PAIN. For further information please visit [email or web address] Please follow instruction sheet, as given.

## 2011-09-21 NOTE — Procedures (Signed)
Exercise Treadmill Test  Pre-Exercise Testing Evaluation Rhythm: normal sinus  Rate: 63   PR:  .15 QRS:  .09  QT:  .43 QTc: .44     Test  Exercise Tolerance Test Ordering MD: Charlton Haws, MD  Interpreting MD: Tereso Newcomer PA-C  Unique Test No: 1  Treadmill:  1  Indication for ETT: chest pain - rule out ischemia  Contraindication to ETT: No   Stress Modality: exercise - treadmill  Cardiac Imaging Performed: non   Protocol: standard Bruce - maximal  Max BP: 165/84  Max MPHR (bpm):  172 85% MPR (bpm):  146  MPHR obtained (bpm):  121 % MPHR obtained:  72%  Reached 85% MPHR (min:sec):  n/a Total Exercise Time (min-sec):  3:48  Workload in METS:  4.8 Borg Scale: 13  Reason ETT Terminated:  patient's desire to stop    ST Segment Analysis At Rest: normal ST segments - no evidence of significant ST depression With Exercise: non-specific ST changes  Other Information Arrhythmia:  Frequent PVCs; one Ventricular Couplet Angina during ETT:  absent (0) Quality of ETT:  non-diagnostic  ETT Interpretation:  normal - no evidence of ischemia by ST analysis at submaximal exercise  Comments: Poor exercise tolerance. No chest pain. Normal BP response to exercise. No ST changes at submaximal exercise. Frequent PVCs and one ventricular couplet noted.  Recommendations: Arrange Lexiscan Myoview. Tereso Newcomer, PA-C  2:54 PM 09/21/2011

## 2011-09-29 ENCOUNTER — Emergency Department (HOSPITAL_COMMUNITY)
Admission: EM | Admit: 2011-09-29 | Discharge: 2011-09-29 | Disposition: A | Discharge status: Home Health | Payer: Medicare Other | Source: Home / Self Care | Attending: Family Medicine | Admitting: Family Medicine

## 2011-09-29 ENCOUNTER — Encounter (HOSPITAL_COMMUNITY): Payer: Self-pay

## 2011-09-29 DIAGNOSIS — S91209A Unspecified open wound of unspecified toe(s) with damage to nail, initial encounter: Secondary | ICD-10-CM

## 2011-09-29 DIAGNOSIS — S91109A Unspecified open wound of unspecified toe(s) without damage to nail, initial encounter: Secondary | ICD-10-CM

## 2011-09-29 NOTE — ED Provider Notes (Signed)
History     CSN: [id number]  Arrival date & time 09/29/11  1339   First MD Initiated Contact with Patient 09/29/11 1523      Chief Complaint  Patient presents with  . Toe Injury    (Consider location/radiation/quality/duration/timing/severity/associated sxs/prior treatment) HPI Comments: The patient states she caught her left great toe nail on a vacuum cleaaner and avulsed the nail plate. Bleeding controlled. She has thickened nails form fungal infestation.   The history is provided by the patient.    Past Medical History  Diagnosis Date  . Deaf   . OBESITY   . BIPOLAR DISORDER UNSPECIFIED   . FATIGUE   . Hypertension   . Chest pain, atypical   . Carpal tunnel syndrome of left wrist     History reviewed. No pertinent past surgical history.  No family history on file.  History  Substance Use Topics  . Smoking status: Never Smoker   . Smokeless tobacco: Not on file  . Alcohol Use: No    OB History    Grav Para Term Preterm Abortions TAB SAB Ect Mult Living                  Review of Systems  Constitutional: Negative.   HENT: Negative.   Eyes: Negative.   Respiratory: Negative.   Gastrointestinal: Negative.   Genitourinary: Negative.   Musculoskeletal: Negative.     Allergies  Review of patient's allergies indicates no known allergies.  Home Medications   Current Outpatient Rx  Name Route Sig Dispense Refill  . BUPROPION HCL ER (SR) 150 MG PO TB12 Oral Take 150 mg by mouth 2 (two) times daily.    Marland Kitchen CLONAZEPAM 1 MG PO TABS Oral Take 1 mg by mouth 4 (four) times daily as needed. For anxiety    . FLUOXETINE HCL 10 MG PO CAPS Oral Take 20 mg by mouth daily.    Marland Kitchen HYDROCHLOROTHIAZIDE 25 MG PO TABS Oral Take 1 tablet (25 mg total) by mouth daily. 30 tablet 6  . RISPERIDONE 1 MG PO TABS Oral Take 1 mg by mouth at bedtime.      BP 131/80  Pulse 66  Temp 98.5 F (36.9 C) (Oral)  Resp 16  Ht 5\' 6"  (1.676 m)  Wt 171 lb (77.565 kg)  BMI 27.60 kg/m2   SpO2 96%  LMP 09/22/2011  Physical Exam  Nursing note and vitals reviewed. Constitutional: She appears well-developed and well-nourished.  Neck: Normal range of motion. Neck supple.  Cardiovascular: Normal rate and regular rhythm.   Skin:       Left great toe nail almost totally avulsed form the plate. Bleeding controlled. Local with 2% xylocaine. Rubber band tourniquet. Betadine prep . Nail removed without difficulty. Dressing applied with neosporin ointment.      ED Course  Procedures (including critical care time)  Labs Reviewed - No data to display No results found.   1. Traumatic avulsion of nail plate of toe       MDM          Randa Spike, MD 09/29/11 1601

## 2011-09-29 NOTE — Discharge Instructions (Signed)
Soak in warm water. Keep clean and dry. Apply antibiotic ointment and keep covered with the supplies given. It will take a while for the new nail to grow out. Follow up if any complication. Tylenol or motrin as needed. You will want to keep the area covered until to nail grows out. May use the coban for this. If you run out you can get it at [facility]

## 2011-09-29 NOTE — ED Notes (Signed)
Pt c/o evulsion of L great toe.  Onset 1 hour ago.  Pt struck toe while vacuuming.  Bleeding controlled upon arrival.

## 2011-10-03 ENCOUNTER — Other Ambulatory Visit (HOSPITAL_COMMUNITY): Payer: 59

## 2012-04-23 ENCOUNTER — Ambulatory Visit: Payer: 59 | Admitting: Family Medicine

## 2012-04-29 ENCOUNTER — Other Ambulatory Visit: Payer: Self-pay | Admitting: Family Medicine

## 2012-04-29 ENCOUNTER — Telehealth: Payer: Self-pay | Admitting: Family Medicine

## 2012-04-29 DIAGNOSIS — R030 Elevated blood-pressure reading, without diagnosis of hypertension: Secondary | ICD-10-CM

## 2012-04-29 MED ORDER — HYDROCHLOROTHIAZIDE 25 MG PO TABS: 25.0000 mg | ORAL_TABLET | Freq: Every day | ORAL | Status: DC

## 2012-04-29 NOTE — Telephone Encounter (Signed)
(  Copied from [facility] encounter for clarity of documentation)  Contacted by pharmacist ([facility] in [place]); pt there to refill HCTZ, has been out "for a long time." BP checked there 180's/90's and 190/111. Upcoming appointment on 2/6. Pt is an established pt in the practice but she has not been seen by me as her PCP as of yet (previously seen by Dr. Denyse Amass). Of note, pt had an appointment with me scheduled earlier last week (?Mon 20th) that was cancelled but I am unsure why.   Refilled HCTZ 25 mg PO daily and advised pharmacist to communicate with pt that she should attempt to move appointment up sooner than Feb 6, if possible. If she is unable to be seen by me sooner than that, my advice was to be seen by another provider, again sooner rather than later.  --CMS

## 2012-04-29 NOTE — Progress Notes (Signed)
Contacted by pharmacist ([facility] in [place]); pt there to refill HCTZ, has been out "for a long time." BP checked there 180's/90's and 190/111. Upcoming appointment on 2/6. Pt is an established pt in the practice but she has not been seen by me as her PCP as of yet (previously seen by Dr. Denyse Amass). Of note, pt had an appointment with me scheduled earlier last week (?Mon 20th) that was cancelled but I am unsure why.  Refilled HCTZ 25 mg PO daily and advised pharmacist to communicate with pt that she should attempt to move appointment up sooner than Feb 6, if possible. If she is unable to be seen by me sooner than that, my advice was to be seen by another provider, again sooner rather than later. --CMS

## 2012-05-03 ENCOUNTER — Emergency Department (HOSPITAL_COMMUNITY)
Admission: EM | Admit: 2012-05-03 | Discharge: 2012-05-03 | Disposition: A | Discharge status: Home / Self Care | Payer: Medicare PPO | Attending: Emergency Medicine | Admitting: Emergency Medicine

## 2012-05-03 ENCOUNTER — Encounter (HOSPITAL_COMMUNITY): Payer: Self-pay | Admitting: Emergency Medicine

## 2012-05-03 DIAGNOSIS — Z79899 Other long term (current) drug therapy: Secondary | ICD-10-CM | POA: Insufficient documentation

## 2012-05-03 DIAGNOSIS — H913 Deaf nonspeaking, not elsewhere classified: Secondary | ICD-10-CM | POA: Insufficient documentation

## 2012-05-03 DIAGNOSIS — J019 Acute sinusitis, unspecified: Secondary | ICD-10-CM | POA: Insufficient documentation

## 2012-05-03 DIAGNOSIS — F319 Bipolar disorder, unspecified: Secondary | ICD-10-CM | POA: Insufficient documentation

## 2012-05-03 DIAGNOSIS — J329 Chronic sinusitis, unspecified: Secondary | ICD-10-CM

## 2012-05-03 DIAGNOSIS — I10 Essential (primary) hypertension: Secondary | ICD-10-CM

## 2012-05-03 HISTORY — DX: Bipolar disorder, unspecified: F31.9

## 2012-05-03 MED ORDER — AZITHROMYCIN 250 MG PO TABS: 250.0000 mg | ORAL_TABLET | Freq: Every day | ORAL | Status: DC

## 2012-05-03 NOTE — ED Notes (Signed)
Patient is deaf and c/o headache for the past few days.  Has been taking meds but his headache is seems to be getting worse.  Patient is aware that sign language interpreter has been called.

## 2012-05-03 NOTE — ED Notes (Signed)
Pt is deaf but can read lips when spoken to slowly  Pt states she came in tonight for c/o headache, shoulders aching, and shortness of breath  Pt has hx of htn  Denies chest pain  Pt states she has not felt well for about a week

## 2012-05-03 NOTE — ED Notes (Signed)
Sing interpreter at the bedside.  

## 2012-05-03 NOTE — ED Notes (Signed)
Secretary placed a call for interpreter for patient

## 2012-05-03 NOTE — ED Notes (Signed)
Sign language interpreter called back and will be in the department in about .  Patient has been notified

## 2012-05-03 NOTE — ED Provider Notes (Signed)
History     CSN: [id number]  Arrival date & time 05/03/12  2034   First MD Initiated Contact with Patient 05/03/12 2243     Patient with translator- patient deaf Chief Complaint  Patient presents with  . Hypertension    (Consider location/radiation/quality/duration/timing/severity/associated sxs/prior treatment) HPI Patient with facial pain and bloody nasal discharge who presents concerned about high blood pressure.  Patient states she did not take her bp meds for two months and just restarted this week.  She presented to the pharmacy and had her bp pressure checked there.  She was told by them to come to ed.  She felt like she had some swelling of her face.  She has not had fever, focal neurologic deficits, chest pain, or dyspnea.   Past Medical History  Diagnosis Date  . Hypertension   . Bipolar 1 disorder   . Deaf     Past Surgical History  Procedure Date  . Tubal ligation     History reviewed. No pertinent family history.  History  Substance Use Topics  . Smoking status: Never Smoker   . Smokeless tobacco: Not on file  . Alcohol Use: No    OB History    Grav Para Term Preterm Abortions TAB SAB Ect Mult Living                  Review of Systems  All other systems reviewed and are negative.    Allergies  Review of patient's allergies indicates no known allergies.  Home Medications   Current Outpatient Rx  Name  Route  Sig  Dispense  Refill  . CLONAZEPAM 1 MG PO TABS   Oral   Take 1 mg by mouth as needed. Anxiety.         Marland Kitchen HYDROCHLOROTHIAZIDE 25 MG PO TABS   Oral   Take 25 mg by mouth daily.         Marland Kitchen NAPROXEN SODIUM 220 MG PO TABS   Oral   Take 220-440 mg by mouth daily as needed. Headache.         Marland Kitchen RISPERIDONE 1 MG PO TABS   Oral   Take 1 mg by mouth daily.         . SERTRALINE HCL 100 MG PO TABS   Oral   Take 100 mg by mouth daily.         Marland Kitchen ZOLPIDEM TARTRATE 10 MG PO TABS   Oral   Take 10 mg by mouth at bedtime as  needed.           BP 155/89  Pulse 62  Temp 98.1 F (36.7 C) (Oral)  Resp 16  SpO2 96%  Physical Exam  Nursing note and vitals reviewed. Constitutional: She is oriented to person, place, and time. She appears well-developed and well-nourished.  HENT:  Head: Normocephalic and atraumatic.  Eyes: Conjunctivae normal and EOM are normal. Pupils are equal, round, and reactive to light.  Fundoscopic exam:      The right eye shows no hemorrhage and no papilledema. The right eye shows no venous pulsations.      The left eye shows no hemorrhage and no papilledema. The left eye shows no venous pulsations. Neck: Normal range of motion. Neck supple.  Cardiovascular: Normal rate, regular rhythm, normal heart sounds and intact distal pulses.   Pulmonary/Chest: Effort normal and breath sounds normal.  Abdominal: Soft. Bowel sounds are normal.  Musculoskeletal: Normal range of motion.  Neurological: She is alert and  oriented to person, place, and time.  Skin: Skin is warm and dry.  Psychiatric: She has a normal mood and affect. Her behavior is normal. Judgment and thought content normal.    ED Course  Procedures (including critical care time)  Labs Reviewed - No data to display No results found.   No diagnosis found.    MDM  Patient with symptoms c.w. sinusits with bloody nasal discharge.  Repeat bp 155/89- doubt headache from this bp with history of hypertension and normal exam.        Hilario Quarry, MD 05/03/12 [phone]

## 2012-05-04 ENCOUNTER — Encounter (HOSPITAL_COMMUNITY): Payer: Self-pay

## 2012-05-10 ENCOUNTER — Encounter: Payer: Self-pay | Admitting: Family Medicine

## 2012-05-10 ENCOUNTER — Ambulatory Visit (INDEPENDENT_AMBULATORY_CARE_PROVIDER_SITE_OTHER): Payer: Medicare PPO | Admitting: Family Medicine

## 2012-05-10 VITALS — BP 138/86 | HR 57 | Ht 66.0 in | Wt 173.0 lb

## 2012-05-10 DIAGNOSIS — I10 Essential (primary) hypertension: Secondary | ICD-10-CM

## 2012-05-10 DIAGNOSIS — F319 Bipolar disorder, unspecified: Secondary | ICD-10-CM

## 2012-05-10 NOTE — Assessment & Plan Note (Addendum)
A: Stable on medications managed through [facility]. No current worsening depression, no current SI.  P: Reviewed medications, instructed to continue all and f/u as needed/scheduled with [facility]. No plans by me to change/adjust medications.

## 2012-05-10 NOTE — Patient Instructions (Addendum)
Thank you for coming in, today. It was nice to meet you. Keep taking your HCTZ 25 mg once per day. Make an appointment to see me in 1-2 months to recheck your blood pressure. We might add another medicine and/or do some blood tests at that visit. Keep your regular appointments at [facility]. Call the clinic or come back sooner, if you need, at any  time. --Dr. Casper Harrison

## 2012-05-10 NOTE — Progress Notes (Signed)
  Subjective:    Patient ID: Claudia Gregory, female    DOB: [birth year], 52 y.o.   MRN: [id number]  HPI: Pt presents for follow-up on blood pressure and to discuss medications. Pt is deaf; sign language interpreter services utilized. Pt was last seen by Dr. Denyse Amass almost 1 year ago and has been without blood pressure medication until about two weeks ago (I was contacted by the pt's pharmacy and told that pt was there and her blood pressure was high, and pt filled HCTZ that day; see telephone note 1/26). Pt states she was started on Zoloft not long after she saw Dr. Denyse Amass and had great improvement in her mood and how she felt, so she felt like "she wouldn't need her blood pressure any more" so she quit taking it and did not follow up with Dr. Denyse Amass.  Pt reports 2-3 weeks prior to restarting her medication, she had periods of feeling "jittery" with headache and shortness of breath, and occasional chest pain.   Since refilling HCTZ, pt has had no further symptoms and has tolerated the medicine well. She does endorse occasional shortness of breath with increased activity, such as when she "chases her cat or her granddaughter." In general, though, she attributes improvement of her symptoms to being back on blood pressure medication.  Pt was recently treated for sinusitis with azithromycin. Pt is seen at [facility] for anxiety/depression. She currently takes Zoloft, risperidone, clonazepam PRN, and Ambien PRN.  SH: Never smoker.  Review of Systems: As above. Denies current active SOB or chest pain, no N/V or change in bowel habits. Currently feels well on medications prescribed by [facility] providers and denies new/worsened depression/anxiety-type symptoms. No further sinusitis pain/nasal discharge.     Objective:   Physical Exam BP 138/86  Pulse 57  Ht 5\' 6"  (1.676 m)  Wt 173 lb (78.472 kg)  BMI 27.92 kg/m2 Gen: adult female, in NAD, pleasant and cooperative; communicates through sign interpreters (regular  interpreter and intern) HEENT: TMs clear bilaterally, sclerae/conjunctivae clear, no nasal or oropharyngeal erythema/edema or exudate  Neck supple, no soft tissue swelling/fullness or tenderness, no cervical lymphadenopathy, no facial/sinus tenderness Cardio: RRR, no rub/murmur appreciated Pulm: CTAB, no wheezes, normal WOB Abd: soft, nontender, nondistended, BS+ Ext: distal pulses 2+/symmetric, moves all extremities equally, gait normal; no LE swelling     Assessment & Plan:

## 2012-05-10 NOTE — Assessment & Plan Note (Signed)
A: BP today 134/80. Pt reports compliance with HCTZ.  P: Plan to monitor at routine follow-up. May consider additional agents if not controlled with HCTZ alone. Will consider checking BMP at next visit, and definitely will check if ACE or ARB is started.

## 2012-11-16 ENCOUNTER — Ambulatory Visit (INDEPENDENT_AMBULATORY_CARE_PROVIDER_SITE_OTHER): Payer: Medicare PPO | Admitting: Family Medicine

## 2012-11-16 ENCOUNTER — Encounter: Payer: Self-pay | Admitting: Family Medicine

## 2012-11-16 VITALS — BP 147/92 | HR 66 | Ht 66.0 in | Wt 172.0 lb

## 2012-11-16 DIAGNOSIS — R232 Flushing: Secondary | ICD-10-CM

## 2012-11-16 DIAGNOSIS — N951 Menopausal and female climacteric states: Secondary | ICD-10-CM

## 2012-11-16 DIAGNOSIS — I10 Essential (primary) hypertension: Secondary | ICD-10-CM

## 2012-11-16 DIAGNOSIS — R5383 Other fatigue: Secondary | ICD-10-CM

## 2012-11-16 DIAGNOSIS — R5381 Other malaise: Secondary | ICD-10-CM

## 2012-11-16 DIAGNOSIS — N926 Irregular menstruation, unspecified: Secondary | ICD-10-CM

## 2012-11-16 DIAGNOSIS — F319 Bipolar disorder, unspecified: Secondary | ICD-10-CM

## 2012-11-16 LAB — POCT URINE PREGNANCY: Preg Test, Ur: NEGATIVE

## 2012-11-16 LAB — CBC
Hemoglobin: 14.5 g/dL (ref 12.0–15.0)
Platelets: 298 10*3/uL (ref 150–400)
RBC: 4.89 MIL/uL (ref 3.87–5.11)
WBC: 9 10*3/uL (ref 4.0–10.5)

## 2012-11-16 MED ORDER — LISINOPRIL-HYDROCHLOROTHIAZIDE 20-25 MG PO TABS: 1.0000 | ORAL_TABLET | Freq: Every day | ORAL | Status: DC

## 2012-11-16 NOTE — Patient Instructions (Signed)
To front office: Make Claudia Gregory an appointment for lab draw in two weeks, then an appointment with me in 2 months.  Thank you for coming in, today! I'm sorry you're not feeling well. It sounds like you have a lot of stress at home. Some of your symptoms might be due to menopause. But this might take several months of monitoring to see if that's what it is. Some of this could be due to your blood pressure, which is still a little high. I am adding a blood pressure medicine, today. It is called lisinopril. It works well with the HCTZ. One symptom it might cause is some cough. This is very common and usually it is not bad. A very rare side effect is swelling of your lips, tongue, and throat. They are rare but if you ever have this, go to the emergency room. We will check some bloodwork today, to test your kidneys, liver, and thyroid. I want you to come back in about two weeks just to get another lab check. Then make an appointment to see me again, in about 2 months. If you need to, come back sooner. Make an appointment to see thepeople at [facility] in the next few weeks to see if those medicine need to be changed. Please feel free to call with any questions or concerns at any time, at [phone]. --Dr. Casper Harrison

## 2012-11-17 LAB — COMPREHENSIVE METABOLIC PANEL
Albumin: 4.8 g/dL (ref 3.5–5.2)
BUN: 14 mg/dL (ref 6–23)
Calcium: 10 mg/dL (ref 8.4–10.5)
Chloride: 98 mEq/L (ref 96–112)
Creat: 0.76 mg/dL (ref 0.50–1.10)
Glucose, Bld: 94 mg/dL (ref 70–99)
Potassium: 3.3 mEq/L — ABNORMAL LOW (ref 3.5–5.3)

## 2012-11-17 LAB — TSH: TSH: 3.641 u[IU]/mL (ref 0.350–4.500)

## 2012-11-21 NOTE — Progress Notes (Signed)
  Subjective:    Patient ID: Claudia Gregory, female    DOB: 23-Jun-1960, 52 y.o.   MRN: [id number]  HPI: Pt presents to clinic for general follow-up. Pt would also like to discuss some hot flashes and irregular periods. Patient is deaf. Visit conducted using sign language assistance from in-person interpretor.  HTN - pt reports being out of HCTZ for a few weeks. Has had no changes in vision, headaches, or palpitations. -does report some occasional pain in her arms and "squeezing" in her chest, but thinks this is due to her work/stress -pt reports she does lots of lifting/turning repetitive motion in her work (works for [employer] , doing Pharmacologist work) -denies any frank chest pain now; some "burning" in her muscles on the right, none in the center, no radiation  Behavioral health - pt has bipolar disorder, reports compliance with Klonopin, Zoloft, and risperidone; uncertain of her psychiatrist's name ("Dr. Mervyn Skeeters") -previously managed by [facility], now seeing [facility] in [place], closer to her home; they manage her medications -reports increased stress, worry, but generally feels "okay," with no true new/worse symptoms -pt reports symptoms as above with her work, a teenage son who is "being a teenager," and her hot flashes (see below)  Hot flashes/irregular periods - hot flashes symptoms on and off for a few months, now more constant for about one month -pt describes "lots of sweating, feeling hot," at random times, for a few seconds or minutes at a time -period in June was normal, then none in July, then one earlier this month; pt previously had regular, "normal" periods -uncertain of when her mother went into menopause -pt wonders if her symptoms could be related to being off of her BP medication, as above  Fatigue - long-standing problem for patient, which she attributes to stress, sometimes medications, and bipolar disorder (often has trouble sleeping) -no dizziness, lightheadedness, syncope  or near-syncope; uncertain last check of her thyroid  Pt is a never smoker. In addition to the above documentation, pt's PMH, surgical history, FH, and SH all reviewed and updated where appropriate in the EMR. I have also reviewed and updated the pt's allergies and current medications as appropriate.  Review of Systems: Otherwise, full 12-system ROS was reviewed and all negative.     Objective:   Physical Exam BP 147/92  Pulse 66  Ht 5\' 6"  (1.676 m)  Wt 172 lb (78.019 kg)  BMI 27.77 kg/m2 Gen: well-appearing adult female, very pleasant, appreciative of signing for communication HEENT: [place]/AT, sclerae/conjunctivae clear, no lid lag, EOMI, PERRLA   MMM, posterior oropharynx clear, no cervical lymphadenopathy  neck supple with full ROM, no masses appreciated; thyroid not enlarged  Cardio: RRR, no murmur appreciated; distal pulses intact/symmetric Pulm: CTAB, no wheezes, normal WOB  Abd: soft, nondistended, BS+, no HSM Ext: warm/well-perfused, no cyanosis/clubbing/edema MSK: strength 5/5 in all four extremities, no frank joint deformity/effusion  normal ROM to all four extremities with no point muscle/bony tenderness in spine  Mild reproducible tenderness along ribs, shoulder girdle on right chest wall Neuro/Psych: alert/oriented, sensation grossly intact; normal gait/balance  mood euthymic with congruent affect, though does appear slightly distressed when talking about stress at home     Assessment & Plan:

## 2012-11-24 DIAGNOSIS — R232 Flushing: Secondary | ICD-10-CM | POA: Insufficient documentation

## 2012-11-24 NOTE — Assessment & Plan Note (Signed)
Uncertain cause; possibly related to simple stress/worries at home, and/or contribution of bipolar disorder, possible early menopause, or other organic causes (anemia, thyroid dysfunction, etc). No anemia or apparent hypothyroidism on lab draw this appointment. Briefly counseled on sleep hygiene in case this is contributing. Will follow up as needed.

## 2012-11-24 NOTE — Assessment & Plan Note (Signed)
Possible early menopause. Pt describes them as more of an irritation than a true disruption or distressing symptom. Will plan to monitor for now. Could consider adjunctive/supportive treatment with low-dose gabapentin in the future, though am hesitant to prescribe this in light of pt's bipolar disorder and other psychotropic medications managed by other providers.

## 2012-11-24 NOTE — Assessment & Plan Note (Signed)
A: BP this visit 147/92, though reports being out of HCTZ. No side effects of medications reported. Pt questions whether constellation of symptoms per HPI could be related to not taking her medication.  P: Added lisinopril with Rx for combination pill. Plan to check baseline kidney function, then recheck in two weeks and follow routinely after that.

## 2012-11-24 NOTE — Assessment & Plan Note (Signed)
Possible early menopause, given concomitant hot flashes, though irregular periods only present for a few months (need to be amenorrheic without other explanation for at least 12 months to call this menopause). Pt has hx of tubal ligation but agreed to pregnancy test this visit, which was negative. TSH also wnl. Plan to monitor for now.

## 2012-11-24 NOTE — Assessment & Plan Note (Signed)
A: Stable on medications, now managed through [facility] in [place] ([place]viously [place]). No current SI/HI, or AH/VH. Some stress at home, but generally doing well.  P: Continue medications and advised f/u with [facility] to see if medications need to be adjusted in light of hot flashes, chest discomfort, increased worry, etc. I do not plan to adjust pt's medications now or in the future unless acutely necessary, given that she has been very stable with community resources and apparently has good follow-up with [facility] providers.

## 2012-11-30 ENCOUNTER — Other Ambulatory Visit: Payer: Medicare PPO

## 2012-11-30 ENCOUNTER — Other Ambulatory Visit: Payer: Self-pay | Admitting: Family Medicine

## 2012-11-30 DIAGNOSIS — I10 Essential (primary) hypertension: Secondary | ICD-10-CM

## 2012-11-30 NOTE — Progress Notes (Signed)
BMP DONE TODAY Claudia Gregory 

## 2012-12-01 LAB — BASIC METABOLIC PANEL
CO2: 25 mEq/L (ref 19–32)
Chloride: 100 mEq/L (ref 96–112)
Creat: 0.68 mg/dL (ref 0.50–1.10)
Potassium: 4 mEq/L (ref 3.5–5.3)
Sodium: 135 mEq/L (ref 135–145)

## 2013-01-16 ENCOUNTER — Ambulatory Visit: Payer: Self-pay | Admitting: Family Medicine

## 2013-02-05 ENCOUNTER — Other Ambulatory Visit: Payer: Self-pay | Admitting: Family Medicine

## 2013-02-05 ENCOUNTER — Ambulatory Visit (INDEPENDENT_AMBULATORY_CARE_PROVIDER_SITE_OTHER): Payer: Medicare PPO | Admitting: Family Medicine

## 2013-02-05 ENCOUNTER — Encounter: Payer: Self-pay | Admitting: Family Medicine

## 2013-02-05 VITALS — BP 131/82 | HR 72 | Temp 98.1°F | Ht 66.0 in | Wt 176.6 lb

## 2013-02-05 DIAGNOSIS — F319 Bipolar disorder, unspecified: Secondary | ICD-10-CM

## 2013-02-05 DIAGNOSIS — I10 Essential (primary) hypertension: Secondary | ICD-10-CM

## 2013-02-05 DIAGNOSIS — E785 Hyperlipidemia, unspecified: Secondary | ICD-10-CM | POA: Insufficient documentation

## 2013-02-05 NOTE — Patient Instructions (Addendum)
Thank you for coming in, today!  Keep taking your medications without any changes. Follow up with [facility] as scheduled in January.  Your cholesterol is high, but you have a low risk of stroke or heart attack. I do not recommend starting any medication for cholesterol at this point. Next year we might recheck things, to see if that recommendation changes.  Please come back to see me in 6 months, or sooner if you need. Please feel free to call with any questions or concerns at any time, at [phone]. --Dr. Casper Harrison

## 2013-02-05 NOTE — Assessment & Plan Note (Signed)
A: Reviewed lab report from blood draw done through [facility] back in July. Total cholesterol 239, HDL 58, LDL 156. Calculated 10-year risk <2%, with 39% lifetime risk, so not a candidate for statin therapy at this time.  P: Counseled on general diet improvements, weight loss, etc. No plan to start statin at this point. Consider recheck lipid panel in 1 year.

## 2013-02-05 NOTE — Progress Notes (Signed)
  Subjective:    Patient ID: Claudia Gregory, female    DOB: 1960/06/24, 52 y.o.   MRN: [id number]  HPI: Pt presents to clinic for f/u on hypertension and bipolar disorder. Also wishes to discuss cholesterol. In-person interpretation service utilized, [employer] (pt is deaf and communicates by ASL).   HTN - reports compliance with lisinopril-HCTZ -recent lab draws show good kidney function and potassium levels, baseline and after increasing med dose -occasionally checks BP at outpt pharmacy, runs 130's-140's systolic -denies headache, chest pain, change in vision  Bipolar - followed at [facility] in [facility], saw them recently and changed meds -now on Risperdal 1 mg (caused some weight gain), Zoloft 100, and Buspar 5 TID -reports doing well and denies new/worse symptoms -next appt in January  HLD - pt had lipid panel checked in July through [facility], and requests discussion of results -has never been on a statin medication -results of lab reviewed, total cholesterol 239, HDL 58, LDL 156 -see problem list note  Diabetes screening - pt also had A1c check in July with blood draw described above -A1c at that time 5.6 -denies symptoms suggestive of hypo- or hyperglycemia  Review of Systems: As above. Generally feels well, "better than last time." Denies SI/HI, AH/VH.     Objective:   Physical Exam BP 131/82  Pulse 72  Temp(Src) 98.1 F (36.7 C) (Oral)  Ht 5\' 6"  (1.676 m)  Wt 176 lb 9.6 oz (80.105 kg)  BMI 28.52 kg/m2 Gen: well-appearing adult female in NAD, very pleasant Neuro/psych: deafness at baseline, otherwise nonfocal exam with good strength/balance/ambulation  Mood euthymic with congruent affect, no active hallucination/delusion/psychosis HEENT: [place]/AT, EOMI, MMM Cardio: RRR, no murmur appreciated Lungs: CTAB, no wheezes, normal WOB Abd: soft, nontender, BS+ Ext: warm, well-perfused, no LE edema     Assessment & Plan:

## 2013-02-05 NOTE — Assessment & Plan Note (Signed)
A: Stable on medications, recently stopped Klonopin and started Buspar. Seen at [facility] in [facility]. Reports feeling well. No SI/HI, AH/VH.  P: Continue meds as prescribed through [facility] and f/u with them as directed. I do not plan to adjust any medications as pt has been doing well with Rx's and management through [facility] and apparently has been seeing them appropriately. F/u as needed.

## 2013-02-05 NOTE — Assessment & Plan Note (Signed)
A: BP 132/82, improved from last visit. Recently added lisinopril to HCTZ, reports compliance. Reviewed labs, with good Cr and K.  P: Continue lisinopril-HCTZ 20-25 mg. F/u routinely.

## 2013-04-04 ENCOUNTER — Other Ambulatory Visit: Payer: Self-pay | Admitting: Family Medicine

## 2013-07-26 ENCOUNTER — Other Ambulatory Visit: Payer: Self-pay | Admitting: Family Medicine

## 2013-08-29 ENCOUNTER — Ambulatory Visit: Payer: Self-pay | Admitting: Family Medicine

## 2013-09-05 ENCOUNTER — Ambulatory Visit (INDEPENDENT_AMBULATORY_CARE_PROVIDER_SITE_OTHER): Payer: Medicare PPO | Admitting: Family Medicine

## 2013-09-05 ENCOUNTER — Encounter: Payer: Self-pay | Admitting: Family Medicine

## 2013-09-05 VITALS — BP 100/51 | HR 64 | Temp 98.6°F | Ht 66.0 in | Wt 185.0 lb

## 2013-09-05 DIAGNOSIS — F319 Bipolar disorder, unspecified: Secondary | ICD-10-CM

## 2013-09-05 DIAGNOSIS — I10 Essential (primary) hypertension: Secondary | ICD-10-CM

## 2013-09-05 DIAGNOSIS — M549 Dorsalgia, unspecified: Secondary | ICD-10-CM

## 2013-09-05 MED ORDER — LISINOPRIL-HYDROCHLOROTHIAZIDE 20-25 MG PO TABS: 1.0000 | ORAL_TABLET | Freq: Every day | ORAL | Status: DC

## 2013-09-05 NOTE — Assessment & Plan Note (Addendum)
Likely related to work and appears self-limited, well-controlled with OTC Aleve. Also possibly related to weight; discussed diet / exercise improvements and suggested regular activity as opposed to over-resting, with added benefits of better BP control, etc. Referred to [email or web address] as a place to help with weight management. Suggested supportive care such as OTC back brace, heat / cooling pads, stretching massage, etc. Pt to f/u as needed. Would recommend PT as the next step, +/- plain imaging. Will discuss further at follow-up for wellness visit in 3 months.

## 2013-09-05 NOTE — Progress Notes (Signed)
   Subjective:    Patient ID: Claudia Gregory, female    DOB: 1961/02/03, 53 y.o.   MRN: [id number]  HPI: Pt presents to clinic for f/u of HTN and bipolar disorder. She also has some sore, intermittent low back pain, worse with working (pt works as an TEFL teacher). She takes Aleve, which helps her pain. She has not done anything like using massage, heat, or back braces.  HTN - pt reports she is glad to see better control - reports compliance with lisinopril-HCTZ and states she forgets occasionally and notes that her BP goes up - denies chest pain, SOB, LE edema, or change in vision.  Bipolar disorder - pt reports seeing Dr. Willene Hatchet at [facility] - stable on Buspar, Zoloft, and Risperdal, without significant side effects other than possible weight gain (see below)  Weight gain - pt reports increase in weight of 14lb in 3 months - reports no change in her diet / exercise, but does "sit a lot" due to poor energy, lately - interested in improving her diet and exercise habits, but limited some with back pain  Review of Systems: As above.     Objective:   Physical Exam BP 100/51  Pulse 64  Temp(Src) 98.6 F (37 C) (Oral)  Ht 5\' 6"  (1.676 m)  Wt 185 lb (83.915 kg)  BMI 29.87 kg/m2 Gen: well-appearing adult female in NAD; completely deaf, communicates via signing HEENT: [place]/AT, sclerae/conjunctivae clear, no lid lag, EOMI, PERRLA   MMM, posterior oropharynx clear, no cervical lymphadenopathy  neck supple with full ROM, no masses appreciated; thyroid not enlarged  Cardio: RRR, no murmur appreciated; distal pulses intact/symmetric Pulm: CTAB, no wheezes, normal WOB  Abd: soft, nondistended, BS+, no HSM Ext: warm/well-perfused, no cyanosis/clubbing/edema MSK: strength 5/5 in all four extremities, no frank joint deformity/effusion  normal ROM to all four extremities with no point muscle/bony tenderness in spine Neuro/Psych: alert/oriented, sensation grossly intact; normal gait/balance  mood  euthymic with congruent affect     Assessment & Plan:

## 2013-09-05 NOTE — Assessment & Plan Note (Signed)
A: Stable on current medications, Buspar recently increased to 10 mg BID, Zoloft 100 mg daily, Risperdal 1 mg daily. Follows at [facility] in [place]. No SI/HI, AH/VH.  P: Continue meds per [facility] and f/u as directed with "Dr. Loni Muse" there. No current plans to change Rx's as pt has been stable and is comfortable following with providers at [facility].

## 2013-09-05 NOTE — Patient Instructions (Addendum)
Front desk: Please schedule Ms Bottari an appointment with me in 3 months for a yearly well visit. Thanks!  Thank you for coming in, today!  Everything looks good, today. I'm glad to see your blood pressure is under better control. For your weight, I would recommend starting walking 30 minutes a day, 5 days a week. This should help with your blood pressure and back pain. You can also look at [email or web address] to help you make changes in your diet. We'll watch your weight trend going forward.  I will refill your blood pressure medicine, today. You can keep taking Aleve as needed for your back pain.  Come back in about 3 months for a "wellness visit." We'll do your pap smear and check some blood work, then. We'll also talk more about you getting a colonoscopy and mammogram. Please feel free to call with any questions or concerns at any time, at [phone]. --Dr. Venetia Maxon

## 2013-09-05 NOTE — Assessment & Plan Note (Signed)
A: Much improved since increasing to lisinopril-HCTZ 20-25. Denies significant side effects with medication.  P: Continue current meds and monitor routinely at f/u. Likely will check labs in 3 months for yearly wellness visit (due for Pap, colonoscopy, mammogram).

## 2013-10-22 ENCOUNTER — Other Ambulatory Visit: Payer: Self-pay | Admitting: Obstetrics and Gynecology

## 2013-10-22 DIAGNOSIS — N644 Mastodynia: Secondary | ICD-10-CM

## 2013-10-28 ENCOUNTER — Other Ambulatory Visit: Payer: Self-pay

## 2013-11-04 ENCOUNTER — Other Ambulatory Visit: Payer: Self-pay

## 2014-02-06 ENCOUNTER — Ambulatory Visit (INDEPENDENT_AMBULATORY_CARE_PROVIDER_SITE_OTHER): Payer: Medicare PPO | Admitting: Family Medicine

## 2014-02-06 ENCOUNTER — Encounter: Payer: Self-pay | Admitting: Family Medicine

## 2014-02-06 VITALS — BP 146/92 | HR 73 | Ht 66.0 in | Wt 188.0 lb

## 2014-02-06 DIAGNOSIS — M545 Low back pain, unspecified: Secondary | ICD-10-CM

## 2014-02-06 DIAGNOSIS — I10 Essential (primary) hypertension: Secondary | ICD-10-CM

## 2014-02-06 MED ORDER — TIZANIDINE HCL 2 MG PO TABS: 2.0000 mg | ORAL_TABLET | Freq: Three times a day (TID) | ORAL | Status: DC

## 2014-02-06 NOTE — Progress Notes (Addendum)
   Subjective:    Patient ID: Claudia Gregory, female    DOB: 1960/06/13, 53 y.o.   MRN: [id number]  In-person interpretation service utilized ([employer], [employer], [employer])  HPI: Pt presents to clinic for F/U on HTN and with complaint of back pain.  HTN - reports compliance with lisinopril-HCTZ without new side effects - occasionally has headache, or feet swelling with standing with a long period (better with putting up feet) - denies chest pain, SOB, change in vision  Back pain - present with bending over and standing for long periods of time - present for a few months, now, slowly getting worse - reports pain is a 7/10, stabbing, with some right hip pain occasionally - works for [employer] (lots of lifting, bending, standing) - reports some help with Aleve and better with sitting - denies specific injury and denies significant persistent back pain like this in the past  Review of Systems: As above. Denies weight loss, change in bowel or bladder habits, numbness / weakness in legs or groin.     Objective:   Physical Exam BP 146/92 mmHg  Pulse 73  Ht 5\' 6"  (1.676 m)  Wt 188 lb (85.276 kg)  BMI 30.36 kg/m2  Manual recheck: 140/82 Gen: well-appearing adult female in NAD; deaf HEENT: [place]/AT, EOMI, PERRL Cardio: RRR, no murmur Pulm: CTAB, no wheezes Ext: warm, well-perfused, no LE edema MSK: no significant midline spinal tenderness  Mild-moderate paraspinal lumbar muscle spasm, minimally tender to palpation  Negative sitting straight-leg lift test Neuro: alert / oriented, strength intact throughout all extremities     Assessment & Plan:  See problem list notes.

## 2014-02-06 NOTE — Patient Instructions (Addendum)
Thank you for coming in, today!  Keep taking your blood pressure medicine without any changes for now.  For your back: Use the exercises and stretching information to help with your pain. You can continue to take Aleve. You can use Zanaflex (tizanidine) up to 3 times per day for muscle spasm. It might make you very sleepy the first several times you take it. Take it at night before bed to start with. DO NOT drive within about 6-8 hours of taking Zanaflex.  Come back to see me as you need for your back, or in about 3 months for your blood pressure.  Please feel free to call with any questions or concerns at any time, at [phone]. --Dr. Venetia Maxon

## 2014-02-06 NOTE — Assessment & Plan Note (Signed)
A: Most likely MSK-related without red flag symptoms in history and minimal findings on exam.  P: Supportive care plus exercises / stretching (handouts provided). Continue OTC Aleve. Rx today for Zanaflex PRN, as well. Consider PT int he future, +/- plain imaging and/or refer to sports medicine or ortho.

## 2014-02-06 NOTE — Assessment & Plan Note (Signed)
A: Mildly elevated with acute pain. No significant side effects of medications. No frank symptoms of HTN-related crisis.  P: Continue lisinopril-HCTZ 20-25 mg for now. F/u in about 3 months and adjust meds as needed. Due for yearly wellness visit; likely check labs, pap at that time.

## 2014-02-07 ENCOUNTER — Ambulatory Visit: Payer: Self-pay | Admitting: Family Medicine

## 2014-02-07 ENCOUNTER — Other Ambulatory Visit: Payer: Self-pay | Admitting: *Deleted

## 2014-02-07 MED ORDER — TIZANIDINE HCL 2 MG PO TABS: 2.0000 mg | ORAL_TABLET | Freq: Three times a day (TID) | ORAL | Status: DC

## 2014-02-07 NOTE — Telephone Encounter (Signed)
90 day supply. Martin, Tamika L, RN  

## 2014-04-04 HISTORY — PX: COLONOSCOPY: SHX174

## 2014-05-13 ENCOUNTER — Ambulatory Visit (INDEPENDENT_AMBULATORY_CARE_PROVIDER_SITE_OTHER): Payer: Medicare PPO | Admitting: Family Medicine

## 2014-05-13 ENCOUNTER — Encounter: Payer: Self-pay | Admitting: Family Medicine

## 2014-05-13 VITALS — BP 143/79 | HR 79 | Temp 97.7°F | Ht 66.0 in | Wt 189.1 lb

## 2014-05-13 DIAGNOSIS — M62838 Other muscle spasm: Secondary | ICD-10-CM

## 2014-05-13 DIAGNOSIS — K219 Gastro-esophageal reflux disease without esophagitis: Secondary | ICD-10-CM

## 2014-05-13 DIAGNOSIS — E785 Hyperlipidemia, unspecified: Secondary | ICD-10-CM

## 2014-05-13 DIAGNOSIS — I10 Essential (primary) hypertension: Secondary | ICD-10-CM | POA: Diagnosis not present

## 2014-05-13 LAB — COMPREHENSIVE METABOLIC PANEL
ALBUMIN: 4.6 g/dL (ref 3.5–5.2)
ALK PHOS: 74 U/L (ref 39–117)
ALT: 55 U/L — AB (ref 0–35)
AST: 31 U/L (ref 0–37)
BUN: 16 mg/dL (ref 6–23)
CO2: 27 mEq/L (ref 19–32)
Calcium: 10.1 mg/dL (ref 8.4–10.5)
Chloride: 101 mEq/L (ref 96–112)
Creat: 0.69 mg/dL (ref 0.50–1.10)
GLUCOSE: 108 mg/dL — AB (ref 70–99)
POTASSIUM: 3.3 meq/L — AB (ref 3.5–5.3)
Sodium: 130 mEq/L — ABNORMAL LOW (ref 135–145)
TOTAL PROTEIN: 7.6 g/dL (ref 6.0–8.3)
Total Bilirubin: 0.4 mg/dL (ref 0.2–1.2)

## 2014-05-13 LAB — LIPID PANEL
CHOLESTEROL: 236 mg/dL — AB (ref 0–200)
HDL: 64 mg/dL (ref >39)
LDL Cholesterol: 118 mg/dL — ABNORMAL HIGH (ref 0–99)
Total CHOL/HDL Ratio: 3.7 Ratio
Triglycerides: 271 mg/dL — ABNORMAL HIGH (ref <150)
VLDL: 54 mg/dL — ABNORMAL HIGH (ref 0–40)

## 2014-05-13 MED ORDER — TIZANIDINE HCL 2 MG PO TABS: 2.0000 mg | ORAL_TABLET | Freq: Three times a day (TID) | ORAL | Status: DC

## 2014-05-13 MED ORDER — PANTOPRAZOLE SODIUM 20 MG PO TBEC: 20.0000 mg | DELAYED_RELEASE_TABLET | Freq: Every day | ORAL | Status: DC

## 2014-05-13 NOTE — Progress Notes (Signed)
   Subjective:    Patient ID: Claudia Gregory, female    DOB: 19-Oct-1960, 54 y.o.   MRN: [id number]  HPI: Pt presents to clinic for f/u of HTN. In-person sign language interpretation by Britt Bolognese ([place]).  HTN: reports compliance with lisinopril-HCTZ 20-25 - does report a missed dose 1-2 days per month - denies headache, chest pain, SOB (only occasionally with stress / anxiety, seeing at [facility] in [facility]), LE swelling   GERD - on Protonix from urgent care since 12/8 - overall symptoms are better but questions whether to continue it or not - does request refill  Of note, pt does request a refill on Zanaflex for arm muscle spasms. It works well for her, but it does make her sleepy. She questions whether she should be on a medication for cholesterol. She has been told she has high cholesterol in the past.  Review of Systems: As above. Denies coryza-type symptoms, fevers / chills, N/V.     Objective:   Physical Exam BP 143/79 mmHg  Pulse 79  Temp(Src) 97.7 F (36.5 C) (Oral)  Ht 5\' 6"  (1.676 m)  Wt 189 lb 1.6 oz (85.775 kg)  BMI 30.54 kg/m2 Manual recheck BP 128/78 HEENT: [place]/AT, EOMI, PERRLA, MMM Cardio: RRR, no murmur appreciated Pulm: CTAB, no wheezes Abd: soft, nontender, BS+ Ext: warm, well-perfused, no LE edema     Assessment & Plan:  See problem list notes. Due for yearly wellness visit. Overdue for pap smear and mammogram. Due for colonoscopy. Advised to get mammogram and colonoscopy, and advised to return in about 1 month for follow-up.

## 2014-05-13 NOTE — Patient Instructions (Signed)
Thank you for coming in, today!  Your blood pressure looks fine with your current lisinopril-HCTZ 20-25 mg. Keep taking that. Keep following up with [facility] as needed. I will refill your medicine for reflux and muscle spasms. If you want, you can take a half tablet for muscle spasms to help make you less sleepy.  I will check some basic blood work and send you a letter with the results. If it looks like I will recommend a cholesterol medicine, I'll mention that in my letter and mail you a prescription.  Come back to see me in about 1 month. We'll talk about just general preventative health services. If you want to try to get a mammogram and colonoscopy in the meantime, I'll give you that information, today.  Please feel free to call with any questions or concerns at any time, at [phone]. --Dr. Venetia Maxon

## 2014-05-13 NOTE — Assessment & Plan Note (Signed)
Diagnosed clinically at urgent care and started on Protonix several weeks ago. Generally improved with PPI. Advised continuing PPI for now (refilled today) and gradual taper off, based on response. If symptoms return, would recommend restarting / continuing. Pt to f/u as needed.

## 2014-05-13 NOTE — Assessment & Plan Note (Signed)
Present off and on, mostly in upper extremities. Pt does a considerable amount of work with her hands / arms, and Zanaflex works well for her. Rx refilled today (last fill was 3 months ago and supply has lasted 3 months appropriately). F/u PRN.

## 2014-05-13 NOTE — Assessment & Plan Note (Signed)
A: Last lipid panel drawn at [facility] about 9 months ago. Cholesterol 239, HDL 58, LDL 156. Calculated 10-year risk <2% with 39% lifetime at that time.  P: Will check lipid panel, today and re-evaluate if pt is a statin candidate or not. F/u to depend on lab results.

## 2014-05-13 NOTE — Assessment & Plan Note (Signed)
A: Reasonable control with lisinopril-HCTZ 20-25 mg daily. No side effects. No frank HTN-related symptoms.  P: Continue ACE-HCTZ. Recheck CMP today. Due for yearly wellness visit; pt to return in about 1 month for this.

## 2014-05-14 ENCOUNTER — Encounter: Payer: Self-pay | Admitting: Family Medicine

## 2014-05-26 ENCOUNTER — Encounter: Payer: Self-pay | Admitting: Internal Medicine

## 2014-05-30 ENCOUNTER — Emergency Department (HOSPITAL_COMMUNITY): Payer: Medicare PPO

## 2014-05-30 ENCOUNTER — Encounter (HOSPITAL_COMMUNITY): Payer: Self-pay | Admitting: *Deleted

## 2014-05-30 ENCOUNTER — Emergency Department (HOSPITAL_COMMUNITY)
Admission: EM | Admit: 2014-05-30 | Discharge: 2014-05-30 | Disposition: A | Discharge status: Home / Self Care | Payer: Medicare PPO | Attending: Emergency Medicine | Admitting: Emergency Medicine

## 2014-05-30 DIAGNOSIS — Z79899 Other long term (current) drug therapy: Secondary | ICD-10-CM | POA: Insufficient documentation

## 2014-05-30 DIAGNOSIS — R062 Wheezing: Secondary | ICD-10-CM | POA: Diagnosis not present

## 2014-05-30 DIAGNOSIS — E669 Obesity, unspecified: Secondary | ICD-10-CM | POA: Diagnosis not present

## 2014-05-30 DIAGNOSIS — R0602 Shortness of breath: Secondary | ICD-10-CM | POA: Insufficient documentation

## 2014-05-30 DIAGNOSIS — H9190 Unspecified hearing loss, unspecified ear: Secondary | ICD-10-CM | POA: Insufficient documentation

## 2014-05-30 DIAGNOSIS — I10 Essential (primary) hypertension: Secondary | ICD-10-CM | POA: Diagnosis not present

## 2014-05-30 DIAGNOSIS — F319 Bipolar disorder, unspecified: Secondary | ICD-10-CM | POA: Diagnosis not present

## 2014-05-30 LAB — BASIC METABOLIC PANEL
ANION GAP: 6 (ref 5–15)
BUN: 14 mg/dL (ref 6–23)
CO2: 25 mmol/L (ref 19–32)
Calcium: 9.6 mg/dL (ref 8.4–10.5)
Chloride: 105 mmol/L (ref 96–112)
Creatinine, Ser: 0.69 mg/dL (ref 0.50–1.10)
GFR calc Af Amer: >90 mL/min (ref >90)
GFR calc non Af Amer: >90 mL/min (ref >90)
Glucose, Bld: 143 mg/dL — ABNORMAL HIGH (ref 70–99)
Potassium: 4.1 mmol/L (ref 3.5–5.1)
SODIUM: 136 mmol/L (ref 135–145)

## 2014-05-30 LAB — CBC WITH DIFFERENTIAL/PLATELET
BASOS PCT: 0 % (ref 0–1)
Basophils Absolute: 0 10*3/uL (ref 0.0–0.1)
EOS PCT: 1 % (ref 0–5)
Eosinophils Absolute: 0.1 10*3/uL (ref 0.0–0.7)
HCT: 37.3 % (ref 36.0–46.0)
Hemoglobin: 12.6 g/dL (ref 12.0–15.0)
LYMPHS ABS: 1.6 10*3/uL (ref 0.7–4.0)
Lymphocytes Relative: 25 % (ref 12–46)
MCH: 29.1 pg (ref 26.0–34.0)
MCHC: 33.8 g/dL (ref 30.0–36.0)
MCV: 86.1 fL (ref 78.0–100.0)
Monocytes Absolute: 0.4 10*3/uL (ref 0.1–1.0)
Monocytes Relative: 7 % (ref 3–12)
Neutro Abs: 4.4 10*3/uL (ref 1.7–7.7)
Neutrophils Relative %: 67 % (ref 43–77)
PLATELETS: 234 10*3/uL (ref 150–400)
RBC: 4.33 MIL/uL (ref 3.87–5.11)
RDW: 12.9 % (ref 11.5–15.5)
WBC: 6.6 10*3/uL (ref 4.0–10.5)

## 2014-05-30 LAB — I-STAT TROPONIN, ED: TROPONIN I, POC: 0 ng/mL (ref 0.00–0.08)

## 2014-05-30 MED ORDER — ALBUTEROL SULFATE (2.5 MG/3ML) 0.083% IN NEBU
5.0000 mg | INHALATION_SOLUTION | Freq: Once | RESPIRATORY_TRACT | Status: AC
  Administered 2014-05-30: 5 mg via RESPIRATORY_TRACT
  Filled 2014-05-30: qty 6

## 2014-05-30 MED ORDER — ALBUTEROL SULFATE HFA 108 (90 BASE) MCG/ACT IN AERS
1.0000 | INHALATION_SPRAY | RESPIRATORY_TRACT | Status: DC | PRN
  Administered 2014-05-30: 1 via RESPIRATORY_TRACT
  Filled 2014-05-30: qty 6.7

## 2014-05-30 NOTE — Discharge Instructions (Signed)
Use albuterol as needed for shortness of breath/wheezing. Follow-up with Dr. Venetia Maxon. Return to the ED for new concerns.

## 2014-05-30 NOTE — ED Notes (Signed)
Patient transported to X-ray 

## 2014-05-30 NOTE — ED Notes (Signed)
Pt reports sob x 2 days, denies recent cough. Pt is deaf, have requested sign language interpretor. spo2  99%, no acute distress noted and ekg done.

## 2014-05-30 NOTE — ED Provider Notes (Signed)
CSN: [id number]     Arrival date & time 05/30/14  7858 History   First MD Initiated Contact with Patient 05/30/14 1002     Chief Complaint  Patient presents with  . Shortness of Breath     (Consider location/radiation/quality/duration/timing/severity/associated sxs/prior Treatment) Patient is a 54 y.o. female presenting with shortness of breath. The history is provided by the patient and medical records. The history is limited by a language barrier. A language interpreter was used.  Shortness of Breath  This is a 54 year old female with past medical history significant for bipolar disorder, fatigue, carpal tunnel, hypertension, congenital deafness, presenting to the ED for shortness of breath for the past 2 days. She states she does feel better today than yesterday. 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 She denies any new chemical use or changes in cleaners/soaps. She denies any recent cough or fever.  No associated chest pain, palpitations, dizziness, or weakness. She has no prior history of asthma or COPD. She has never been a smoker. Denies any lower extremity swelling or calf pain. No recent travel, surgeries, or prolonged immobilization. No exogenous estrogens. No history of DVT or PE.  Past Medical History  Diagnosis Date  . OBESITY   . BIPOLAR DISORDER UNSPECIFIED   . FATIGUE   . Chest pain, atypical   . Carpal tunnel syndrome of left wrist   . Hypertension   . Bipolar 1 disorder   . Deaf    Past Surgical History  Procedure Laterality Date  . Tubal ligation     History reviewed. No pertinent family history. History  Substance Use Topics  . Smoking status: Never Smoker   . Smokeless tobacco: Not on file  . Alcohol Use: No   OB History    No data available     Review of Systems  Respiratory: Positive for shortness of breath.   All other systems reviewed and are negative.     Allergies  Review of  patient's allergies indicates no known allergies.  Home Medications   Prior to Admission medications   Medication Sig Start Date End Date Taking? Authorizing Provider  busPIRone (BUSPAR) 10 MG tablet Take 10 mg by mouth 2 (two) times daily.    Historical Provider, MD  lisinopril-hydrochlorothiazide (PRINZIDE,ZESTORETIC) 20-25 MG per tablet Take 1 tablet by mouth daily. 09/05/13   Sharon Mt Street, MD  naproxen sodium (ANAPROX) 220 MG tablet Take 220-440 mg by mouth daily as needed. Headache.    Historical Provider, MD  pantoprazole (PROTONIX) 20 MG tablet Take 1 tablet (20 mg total) by mouth daily. 05/13/14   Little Sturgeon, MD  risperiDONE (RISPERDAL) 1 MG tablet Take 1 mg by mouth daily.    Historical Provider, MD  sertraline (ZOLOFT) 100 MG tablet Take 100 mg by mouth daily.    Historical Provider, MD  tiZANidine (ZANAFLEX) 2 MG tablet Take 1 tablet (2 mg total) by mouth 3 (three) times daily. 05/13/14   Sharon Mt Street, MD   BP 142/79 mmHg  Pulse 71  Temp(Src) 97.9 F (36.6 C) (Oral)  Resp 16  SpO2 99%   Physical Exam  Constitutional: She is oriented to person, place, and time. She appears well-developed and well-nourished. No distress.  HENT:  Head: Normocephalic and atraumatic.  Mouth/Throat: Uvula is midline, oropharynx is clear and moist and mucous membranes are normal.  Eyes: Conjunctivae and EOM are normal. Pupils are equal, round, and reactive to light.  Neck:  Normal range of motion. Neck supple.  Cardiovascular: Normal rate, regular rhythm and normal heart sounds.   Pulmonary/Chest: Effort normal. No respiratory distress. She has wheezes.  Faint expiratory wheezes noted throughout; no retractions or distress noted, O2 sats 97%  Abdominal: Soft. Bowel sounds are normal. There is no tenderness. There is no guarding.  Musculoskeletal: Normal range of motion.  No significant peripheral edema No calf asymmetry, tenderness, or palpable cords  Neurological: She is  alert and oriented to person, place, and time.  Skin: Skin is warm and dry. She is not diaphoretic.  Psychiatric: She has a normal mood and affect.  Nursing note and vitals reviewed.   ED Course  Procedures (including critical care time) Labs Review Labs Reviewed  BASIC METABOLIC PANEL - Abnormal; Notable for the following:    Glucose, Bld 143 (*)    All other components within normal limits  CBC WITH DIFFERENTIAL/PLATELET  I-STAT TROPOININ, ED    Imaging Review Dg Chest 2 View  05/30/2014   CLINICAL DATA:  Burning sensation in chest for 2 days; shortness of breath for 2 days  EXAM: CHEST  2 VIEW  COMPARISON:  July 15, 2011  FINDINGS: Lungs are clear. Heart size and pulmonary vascularity are normal. No pneumothorax. No adenopathy. No bone lesions.  IMPRESSION: No edema or consolidation.   Electronically Signed   By: Lowella Grip III M.D.   On: 05/30/2014 10:57     EKG Interpretation   Date/Time:  Friday May 30 2014 10:00:31 EST Ventricular Rate:  67 PR Interval:  168 QRS Duration: 88 QT Interval:  405 QTC Calculation: 427 R Axis:   59 Text Interpretation:  Sinus rhythm No significant change since last  tracing ECG OTHERWISE WITHIN NORMAL LIMITS Confirmed by Christy Gentles  MD,  DONALD ([id number]) on 05/30/2014 10:20:10 AM      MDM   Final diagnoses:  SOB (shortness of breath)  Wheezing   54 y.o. F with exertional SOB x 2 days, some improvement today.  No associated CP.  EKG NSR without ischemic changes.  On exam, patient in no acute respiratory distress.  Some faint expiratory wheezes noted.  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  Lab and CXR pending.  Albuterol treatment given.  Labwork is reassuring, troponin negative. Chest x-ray is clear. After albuterol treatment patient states she is feeling much better. Suspect bronchospasm induced by cleaning chemicals. Patient has no risk  factors for PE. Doubt ACS, dissection, or other acute cardiac event at this time. Patient given albuterol inhaler in the ED. Discharge home with close PCP follow-up.  Discussed plan with patient, he/she acknowledged understanding and agreed with plan of care.  Return precautions given for new or worsening symptoms.  NALIYAH NETH, PA-C 05/30/14 [place], MD 05/31/14 [phone]

## 2014-06-12 ENCOUNTER — Encounter: Payer: Self-pay | Admitting: Family Medicine

## 2014-06-12 ENCOUNTER — Ambulatory Visit (INDEPENDENT_AMBULATORY_CARE_PROVIDER_SITE_OTHER): Payer: Medicare PPO | Admitting: Family Medicine

## 2014-06-12 VITALS — BP 125/96 | HR 80 | Temp 97.8°F | Ht 66.0 in | Wt 189.4 lb

## 2014-06-12 DIAGNOSIS — Z114 Encounter for screening for human immunodeficiency virus [HIV]: Secondary | ICD-10-CM

## 2014-06-12 DIAGNOSIS — Z0000 Encounter for general adult medical examination without abnormal findings: Secondary | ICD-10-CM

## 2014-06-12 DIAGNOSIS — Z23 Encounter for immunization: Secondary | ICD-10-CM

## 2014-06-12 NOTE — Patient Instructions (Signed)
Thank you for coming in, today!  Everything looks and sounds okay. I think your breathing and energy issues could be related to your anxiety, especially if your medicines aren't helping much. Talk to the people at [facility] next week. Hopefully they can help adjust things at that point.  We will screen you for HIV today (we recommend all adults get checked at least once in their life). I will send you a letter with the result. We will also give you a tetanus shot, today. You can take Tylenol for pain or any elevated temperature with it. Make sure you keep your appointment for your colonoscopy. Schedule an appointment with your OBGYN to get a pap smear. Schedule an appointment to get your mammogram, as well.  Come back to see me in about 3 months. My last day is June 30th. After that, you will get another doctor here in this same office. Please feel free to call with any questions or concerns at any time, at [phone]. --Dr. Venetia Maxon

## 2014-06-12 NOTE — Progress Notes (Addendum)
Subjective:    Patient ID: Claudia Gregory, female    DOB: 1961/03/21, 54 y.o.   MRN: [id number]  Pt is deaf. In-person ASL interpretation by Britt Bolognese ([place]).  HPI: Pt presents to clinic for ED f/u and annual physical exam. She was seen a few weeks ago in the ED for SOB, for which she was prescribed albuterol. She complains of off-and-on shortness of breath with activity and fatigue, but overall this has been better. She does not feel frankly ill. She reports compliance with her regular medicines. Pt is still seeing [facility] in [facility] for mood-related issues. She is due to follow up with them in about 1 week. She feels like her medications are not helping much for her anxiety and mood. She plans to talk to her providers at [facility] and has talked to them in the past. Pt has a colonoscopy scheduled for May 2, though she needs an appointment for a mammogram still. Pt is due for a Pap smear but prefers to have this done by her OBGYN (female). She is interested in getting a Tdap booster and routine HIV screening. Pt is a never smoker.  No family history on file.  Past Medical History  Diagnosis Date  . OBESITY   . BIPOLAR DISORDER UNSPECIFIED   . FATIGUE   . Chest pain, atypical   . Carpal tunnel syndrome of left wrist   . Hypertension   . Bipolar 1 disorder   . Deaf     Past Surgical History  Procedure Laterality Date  . Tubal ligation      History   Social History  . Marital Status: Married    Spouse Name: N/A  . Number of Children: N/A  . Years of Education: N/A   Occupational History  . Not on file.   Social History Main Topics  . Smoking status: Never Smoker   . Smokeless tobacco: Not on file  . Alcohol Use: No  . Drug Use: No  . Sexual Activity: Not on file   Other Topics Concern  . Not on file   Social History Narrative   ** Merged History Encounter **        In addition to the above documentation, pt's PMH, surgical history, FH, and SH all reviewed  and updated where appropriate in the EMR. I have also reviewed and updated the pt's allergies and current medications as appropriate.  Review of Systems: As above. Otherwise, full 12-system ROS was reviewed and all negative.      Objective:   Physical Exam BP 125/96 mmHg  Pulse 80  Temp(Src) 97.8 F (36.6 C) (Oral)  Ht 5\' 6"  (1.676 m)  Wt 189 lb 6.4 oz (85.911 kg)  BMI 30.58 kg/m2  Manual recheck BP exact numbers not recorded but <130 / 80 Gen: well-appearing adult female in NAD; deaf, communicates through ASL HEENT: [place]/AT, sclerae/conjunctivae clear, no lid lag, EOMI, PERRLA   MMM, posterior oropharynx clear, no cervical lymphadenopathy  neck supple with full ROM, no masses appreciated; thyroid not enlarged  Cardio: RRR, no murmur appreciated; distal pulses intact/symmetric Pulm: CTAB, no wheezes, normal WOB  Abd: soft, nondistended, BS+, no HSM Ext: warm/well-perfused, no cyanosis/clubbing/edema MSK: strength 5/5 in all four extremities, no frank joint deformity/effusion  normal ROM to all four extremities with no point muscle/bony tenderness in spine Neuro/Psych: alert/oriented, sensation grossly intact; normal gait/balance  mood slightly anxious (baseline) with congruent and appropriate affect    Assessment & Plan:  54yo female in general  good health with significant anxiety issues, seeing [facility] in [facility] with appt next week - strongly encouraged discussing current mood issues with [facility] providers for potential med adjustments; f/u with me as needed for this - BP reasonably controlled with lisinopril-HCTZ; continue without changes (repeat BP not recorded, as above, but was <130 / 80)  Anticipatory guidance / Risk factor reduction - discussed and recommended good f/u especially with [facility] providers - not in benefit group for statin but continued to encourage good diet / exercise habits, especially given high LDL and high TG's  Immunization / screening / ancillary studies  -  colonoscopy scheduled - encouraged scheduling mammogram and OBGYN appt for Pap smear - Tdap given today - HIV screening done today - defer lab draw given labs checked in the ED last month; consider repeat labs at next f/u in 3 months  F/u in about 3 months or sooner as needed.  Emmaline Kluver, MD PGY-3, [facility] 06/12/2014, 11:15 PM

## 2014-06-13 ENCOUNTER — Encounter: Payer: Self-pay | Admitting: Family Medicine

## 2014-06-13 LAB — HIV ANTIBODY (ROUTINE TESTING W REFLEX): HIV: NONREACTIVE

## 2014-07-15 ENCOUNTER — Other Ambulatory Visit: Payer: Self-pay

## 2014-07-15 DIAGNOSIS — Z1231 Encounter for screening mammogram for malignant neoplasm of breast: Secondary | ICD-10-CM

## 2014-07-21 ENCOUNTER — Ambulatory Visit (AMBULATORY_SURGERY_CENTER): Payer: Self-pay | Admitting: *Deleted

## 2014-07-21 VITALS — Ht 66.0 in | Wt 191.0 lb

## 2014-07-21 DIAGNOSIS — Z1211 Encounter for screening for malignant neoplasm of colon: Secondary | ICD-10-CM

## 2014-07-21 NOTE — Progress Notes (Signed)
No egg or soy allergy No diet pills No home 02 use No issues with constipation No issues with past sedation Pt is deaf- needs sign language interpreter at colon procedure  Pt declined  emmi video  All instructions given via interpreter and pt verbalized understanding of all instructions given today.

## 2014-08-04 ENCOUNTER — Ambulatory Visit (AMBULATORY_SURGERY_CENTER): Payer: Medicare PPO | Admitting: Internal Medicine

## 2014-08-04 ENCOUNTER — Encounter: Payer: Self-pay | Admitting: Internal Medicine

## 2014-08-04 VITALS — BP 125/70 | HR 65 | Temp 98.1°F | Resp 17 | Ht 66.0 in | Wt 189.0 lb

## 2014-08-04 DIAGNOSIS — H9190 Unspecified hearing loss, unspecified ear: Secondary | ICD-10-CM | POA: Insufficient documentation

## 2014-08-04 DIAGNOSIS — I10 Essential (primary) hypertension: Secondary | ICD-10-CM | POA: Diagnosis not present

## 2014-08-04 DIAGNOSIS — Z1211 Encounter for screening for malignant neoplasm of colon: Secondary | ICD-10-CM

## 2014-08-04 DIAGNOSIS — F319 Bipolar disorder, unspecified: Secondary | ICD-10-CM | POA: Diagnosis not present

## 2014-08-04 DIAGNOSIS — K219 Gastro-esophageal reflux disease without esophagitis: Secondary | ICD-10-CM | POA: Diagnosis not present

## 2014-08-04 DIAGNOSIS — K635 Polyp of colon: Secondary | ICD-10-CM | POA: Diagnosis not present

## 2014-08-04 DIAGNOSIS — D125 Benign neoplasm of sigmoid colon: Secondary | ICD-10-CM | POA: Diagnosis not present

## 2014-08-04 DIAGNOSIS — E669 Obesity, unspecified: Secondary | ICD-10-CM | POA: Diagnosis not present

## 2014-08-04 MED ORDER — SODIUM CHLORIDE 0.9 % IV SOLN: 500.0000 mL | INTRAVENOUS | Status: DC

## 2014-08-04 NOTE — Op Note (Signed)
[place]  [address] [place], [id number]   COLONOSCOPY PROCEDURE REPORT  PATIENT: Claudia Gregory, Claudia Gregory  MR#: [id number] BIRTHDATE: 05-22-1960 , 53  yrs. old GENDER: female ENDOSCOPIST: Gatha Mayer, MD, [facility] PROCEDURE DATE:  08/04/2014 PROCEDURE:   Colonoscopy, screening and Colonoscopy with snare polypectomy First Screening Colonoscopy - Avg.  risk and is 50 yrs.  old or older Yes.  Prior Negative Screening - Now for repeat screening. N/A  History of Adenoma - Now for follow-up colonoscopy & has been > or = to 3 yrs.  N/A ASA CLASS:   Class II INDICATIONS:Screening for colonic neoplasia and Colorectal Neoplasm Risk Assessment for this procedure is average risk. MEDICATIONS: Propofol 300 mg IV, Monitored anesthesia care, and Lidocaine 40 mg IV  DESCRIPTION OF PROCEDURE:   After the risks benefits and alternatives of the procedure were thoroughly explained, informed consent was obtained.  The digital rectal exam revealed no abnormalities of the rectum.   The LB [id number]  endoscope was introduced through the anus and advanced to the cecum, which was identified by both the appendix and ileocecal valve. No adverse events experienced.   The quality of the prep was good.  (MiraLax was used)  The instrument was then slowly withdrawn as the colon was fully examined.      COLON FINDINGS: A sessile polyp measuring 5 mm in size was found in the sigmoid colon.  A polypectomy was performed with a cold snare. The resection was complete, the polyp tissue was completely retrieved and sent to histology.   There was mild diverticulosis noted in the left colon.   The examination was otherwise normal. Retroflexed views revealed no abnormalities. The time to cecum = 2.1 Withdrawal time = 15.6   The scope was withdrawn and the procedure completed. COMPLICATIONS: There were no immediate complications.  ENDOSCOPIC IMPRESSION: 1.   Sessile polyp was found in the  sigmoid colon; polypectomy was performed with a cold snare 2.   There was mild diverticulosis noted in the left colon 3.   The examination was otherwise normal - good prep - first screening  RECOMMENDATIONS: Timing of repeat colonoscopy will be determined by pathology findings.  eSigned:  Gatha Mayer, MD, [facility] 08/04/2014 12:40 PM   cc: The Patient and Christa See, MD

## 2014-08-04 NOTE — Progress Notes (Signed)
Stable to RR 

## 2014-08-04 NOTE — Progress Notes (Signed)
Called to room to assist during endoscopic procedure.  Patient ID and intended procedure confirmed with present staff. Received instructions for my participation in the procedure from the performing physician.  

## 2014-08-04 NOTE — Patient Instructions (Addendum)
I found and removed one tiny polyp that looks benign (not cancer). I will let you know pathology results and when to have another routine colonoscopy by mail.  You also have a condition called diverticulosis - common and not usually a problem. Please read the handout provided.  I appreciate the opportunity to care for you. Gatha Mayer, MD, FACG  YOU HAD AN ENDOSCOPIC PROCEDURE TODAY AT [facility]:   Refer to the procedure report that was given to you for any specific questions about what was found during the examination.  If the procedure report does not answer your questions, please call your gastroenterologist to clarify.  If you requested that your care partner not be given the details of your procedure findings, then the procedure report has been included in a sealed envelope for you to review at your convenience later.  YOU SHOULD EXPECT: Some feelings of bloating in the abdomen. Passage of more gas than usual.  Walking can help get rid of the air that was put into your GI tract during the procedure and reduce the bloating. If you had a lower endoscopy (such as a colonoscopy or flexible sigmoidoscopy) you may notice spotting of blood in your stool or on the toilet paper. If you underwent a bowel prep for your procedure, you may not have a normal bowel movement for a few days.  Please Note:  You might notice some irritation and congestion in your nose or some drainage.  This is from the oxygen used during your procedure.  There is no need for concern and it should clear up in a day or so.  SYMPTOMS TO REPORT IMMEDIATELY:   Following lower endoscopy (colonoscopy or flexible sigmoidoscopy):  Excessive amounts of blood in the stool  Significant tenderness or worsening of abdominal pains  Swelling of the abdomen that is new, acute  Fever of 100F or higher   For urgent or emergent issues, a gastroenterologist can be reached at any hour by calling [phone]  [phone].   DIET: Your first meal following the procedure should be a small meal and then it is ok to progress to your normal diet. Heavy or fried foods are harder to digest and may make you feel nauseous or bloated.  Likewise, meals heavy in dairy and vegetables can increase bloating.  Drink plenty of fluids but you should avoid alcoholic beverages for 24 hours.  ACTIVITY:  You should plan to take it easy for the rest of today and you should NOT DRIVE or use heavy machinery until tomorrow (because of the sedation medicines used during the test).    FOLLOW UP: Our staff will call the number listed on your records the next business day following your procedure to check on you and address any questions or concerns that you may have regarding the information given to you following your procedure. If we do not reach you, we will leave a message.  However, if you are feeling well and you are not experiencing any problems, there is no need to return our call.  We will assume that you have returned to your regular daily activities without incident.  If any biopsies were taken you will be contacted by phone or by letter within the next 1-3 weeks.  Please call us at [phone] if you have not heard about the biopsies in 3 weeks.    SIGNATURES/CONFIDENTIALITY: You and/or your care partner have signed paperwork which will be entered into your electronic medical record.  These signatures attest to the fact that that the information above on your After Visit Summary has been reviewed and is understood.  Full responsibility of the confidentiality of this discharge information lies with you and/or your care-partner.  Resume medications Discharge instructions given and handout provided.

## 2014-08-05 ENCOUNTER — Telehealth: Payer: Self-pay

## 2014-08-05 NOTE — Telephone Encounter (Signed)
  Follow up Call-  Call back number 08/04/2014  Post procedure Call Back phone  # [phone]  Permission to leave phone message Yes     Patient questions:  Do you have a fever, pain , or abdominal swelling? No. Pain Score  0 *  Have you tolerated food without any problems? Yes.    Have you been able to return to your normal activities? Yes.    Do you have any questions about your discharge instructions: Diet   No. Medications  No. Follow up visit  No.  Do you have questions or concerns about your Care? No.  Actions: * If pain score is 4 or above: No action needed, pain <4.  No problems per the pt.  phone service signs message to the pt and she responds back and the service reported to me. maw

## 2014-08-08 ENCOUNTER — Encounter: Payer: Self-pay | Admitting: Internal Medicine

## 2014-08-08 DIAGNOSIS — Z860101 Personal history of adenomatous and serrated colon polyps: Secondary | ICD-10-CM

## 2014-08-08 DIAGNOSIS — Z86010 Personal history of colonic polyps: Secondary | ICD-10-CM

## 2014-08-08 HISTORY — DX: Personal history of colonic polyps: Z86.010

## 2014-08-08 HISTORY — DX: Personal history of adenomatous and serrated colon polyps: Z86.0101

## 2014-08-08 NOTE — Progress Notes (Signed)
Quick Note:  5 mm adenoma - repeat colon 2021 ______

## 2014-08-11 ENCOUNTER — Ambulatory Visit: Payer: Self-pay

## 2014-09-15 ENCOUNTER — Ambulatory Visit: Payer: Self-pay

## 2014-09-18 DIAGNOSIS — F3181 Bipolar II disorder: Secondary | ICD-10-CM | POA: Diagnosis not present

## 2014-09-25 DIAGNOSIS — F3181 Bipolar II disorder: Secondary | ICD-10-CM | POA: Diagnosis not present

## 2014-10-16 DIAGNOSIS — F3181 Bipolar II disorder: Secondary | ICD-10-CM | POA: Diagnosis not present

## 2014-10-27 ENCOUNTER — Encounter: Payer: Self-pay | Admitting: Family Medicine

## 2014-10-27 ENCOUNTER — Ambulatory Visit (INDEPENDENT_AMBULATORY_CARE_PROVIDER_SITE_OTHER): Payer: Medicare PPO | Admitting: Family Medicine

## 2014-10-27 ENCOUNTER — Other Ambulatory Visit: Payer: Self-pay | Admitting: Family Medicine

## 2014-10-27 VITALS — BP 138/88 | HR 78 | Temp 98.7°F | Ht 66.0 in | Wt 191.0 lb

## 2014-10-27 DIAGNOSIS — G5793 Unspecified mononeuropathy of bilateral lower limbs: Secondary | ICD-10-CM | POA: Insufficient documentation

## 2014-10-27 DIAGNOSIS — G629 Polyneuropathy, unspecified: Secondary | ICD-10-CM

## 2014-10-27 DIAGNOSIS — G609 Hereditary and idiopathic neuropathy, unspecified: Secondary | ICD-10-CM | POA: Diagnosis not present

## 2014-10-27 LAB — CBC WITH DIFFERENTIAL/PLATELET
BASOS ABS: 0 10*3/uL (ref 0.0–0.1)
Basophils Relative: 0 % (ref 0–1)
EOS ABS: 0.1 10*3/uL (ref 0.0–0.7)
Eosinophils Relative: 1 % (ref 0–5)
HCT: 38.6 % (ref 36.0–46.0)
Hemoglobin: 13.6 g/dL (ref 12.0–15.0)
LYMPHS ABS: 2.2 10*3/uL (ref 0.7–4.0)
Lymphocytes Relative: 25 % (ref 12–46)
MCH: 29.8 pg (ref 26.0–34.0)
MCHC: 35.2 g/dL (ref 30.0–36.0)
MCV: 84.5 fL (ref 78.0–100.0)
MONOS PCT: 10 % (ref 3–12)
MPV: 10.2 fL (ref 8.6–12.4)
Monocytes Absolute: 0.9 10*3/uL (ref 0.1–1.0)
NEUTROS PCT: 64 % (ref 43–77)
Neutro Abs: 5.6 10*3/uL (ref 1.7–7.7)
PLATELETS: 253 10*3/uL (ref 150–400)
RBC: 4.57 MIL/uL (ref 3.87–5.11)
RDW: 13.7 % (ref 11.5–15.5)
WBC: 8.8 10*3/uL (ref 4.0–10.5)

## 2014-10-27 LAB — IRON AND TIBC
%SAT: 31 % (ref 20–55)
Iron: 154 ug/dL — ABNORMAL HIGH (ref 42–145)
TIBC: 500 ug/dL — ABNORMAL HIGH (ref 250–470)
UIBC: 346 ug/dL (ref 125–400)

## 2014-10-27 LAB — BASIC METABOLIC PANEL
BUN: 12 mg/dL (ref 7–25)
CO2: 26 mmol/L (ref 20–31)
CREATININE: 0.6 mg/dL (ref 0.50–1.05)
Calcium: 10.5 mg/dL — ABNORMAL HIGH (ref 8.6–10.4)
Chloride: 100 mmol/L (ref 98–110)
GLUCOSE: 87 mg/dL (ref 65–99)
Potassium: 3.9 mmol/L (ref 3.5–5.3)
Sodium: 135 mmol/L (ref 135–146)

## 2014-10-27 LAB — TSH: TSH: 4.236 u[IU]/mL (ref 0.350–4.500)

## 2014-10-27 MED ORDER — GABAPENTIN 300 MG PO CAPS: 300.0000 mg | ORAL_CAPSULE | Freq: Three times a day (TID) | ORAL | Status: DC

## 2014-10-27 MED ORDER — GABAPENTIN 100 MG PO CAPS: 300.0000 mg | ORAL_CAPSULE | Freq: Three times a day (TID) | ORAL | Status: DC

## 2014-10-27 NOTE — Progress Notes (Signed)
Subjective: Claudia Gregory is a deaf 54 y.o. female here for burning pain in her feet. Encounter was facilitated by an in-person ASL interpretor.  Claudia Gregory reports about 1 month of worsening, now severe burning on the soles of her feet. The soles of her feet appear normal to her. Symptoms are intermittent, worse with walking, not better with any OTC analgesics, not present anywhere else, not associated with anything (e.g. swelling), and have been impairing sleep. No preceding medication changes.   - ROS: +polyuria, polydipsia. No vision changes, fevers, sweats, tremors, HA, vision changes, abd pain, N/V/D/C, myalgias, arthralgias, rash. No CP, SOB.  - Non-smoker. social drinker, meat eater. No history of kidney disease or CA/chemotherapy.  - FH: Diabetes diagnosed as adults in her mother, aunt, uncle, and [employer] - Medications: reviewed and updated  Objective: BP 138/88 mmHg  Pulse 78  Temp(Src) 98.7 F (37.1 C) (Oral)  Ht 5\' 6"  (1.676 m)  Wt 190 lb 15.4 oz (86.619 kg)  BMI 30.84 kg/m2  LMP 10/07/2014 Gen: Well-appearing 54 y.o. female in no distress HEENT: MMM, EOMI, PERRL, anicteric sclerae CV: Regular rate, no murmur, rub or gallop, no JVD Resp: Non-labored, CTAB, no wheezes noted Abd: Soft, NT, ND, BS present, no guarding or organomegaly MSK: No edema noted, full ROM Neuro: Alert and oriented, deaf, speech normal, sensation normal throughout including monofilament and vibratory testing of feet/ankles.  Skin: No abnormalities on bilateral feet, legs, arms, hands.   Assessment/Plan: Claudia Gregory is a 54 y.o. female here for neuropathic pain.

## 2014-10-27 NOTE — Assessment & Plan Note (Signed)
Work up needed to identify etiology, though diabetes is most suspected. Hyper/hypo thyroidism, syphilis, vitamin deficiency, azotemia, alcoholism, idiopathic medication reaction are all considered but less likely. Will titrate up on gabapentin and f/u in 1 month unless sooner f/u is indicated by testing.

## 2014-10-27 NOTE — Patient Instructions (Addendum)
Gabapentin 100mg  helps with this kind of pain.  Take by tablet by mouth Day 1-3:   take one at night  Day 4-6:  Take 2 at night Day 7-10:  take 3 at night Day 11-14:  3 at night, 1 in am Day 15-18:   3 at night, 2 in am Day 19-21: 3 at night, 3 in am Day 22-25: 3 at night, 3 in am, 2 at lunch Day 26 forward : 3 at night, 3 in am, 3 at lunch.   We will let you know the results of the blood work.   Please follow up in about 1 month with me to discuss how the pain is and what our next steps might be.

## 2014-10-28 ENCOUNTER — Telehealth: Payer: Self-pay | Admitting: Family Medicine

## 2014-10-28 LAB — RPR

## 2014-10-28 NOTE — Telephone Encounter (Signed)
Spoke to pt, informed her of the information below. She will call tomorrow, Wed, 10/29/2014 to set up a lab appt. Claudia Gregory

## 2014-10-28 NOTE — Telephone Encounter (Signed)
Work up was negative for neuropathy but pt must have left prior to obtaining Hb A1c. Glucose was wnl on metabolic panel, but has been high, and this is the most likely reason for neuropathy. Will need to return for only POC Hemoglobin A1c. Cannot order this as future.

## 2014-10-31 ENCOUNTER — Other Ambulatory Visit: Payer: Self-pay | Admitting: Family Medicine

## 2014-11-03 ENCOUNTER — Telehealth: Payer: Self-pay | Admitting: Family Medicine

## 2014-11-03 ENCOUNTER — Other Ambulatory Visit: Payer: Self-pay | Admitting: Family Medicine

## 2014-11-03 DIAGNOSIS — G5793 Unspecified mononeuropathy of bilateral lower limbs: Secondary | ICD-10-CM

## 2014-11-03 MED ORDER — GABAPENTIN 300 MG PO CAPS: 300.0000 mg | ORAL_CAPSULE | Freq: Three times a day (TID) | ORAL | Status: DC

## 2014-11-03 NOTE — Telephone Encounter (Signed)
I believe we sent her a letter with these non-urgent results but I may have deferred the letter pending the Hb A1c because an abnormal result from that would completely change management. There was no other sign of cause of her pain. I have refilled her gabapentin for 1 month as I see her next appt with me is 8/29.   Kyah Buesing B. Bonner Puna, MD, PGY-3 11/03/2014 5:18 PM

## 2014-11-03 NOTE — Telephone Encounter (Signed)
Pt called because the doctor refused the refill on her Gabapentin so she is wanting to know if the doctor is going to replace this with a different medication. Also she wanted to know what the lab results were. jw

## 2014-11-04 ENCOUNTER — Telehealth: Payer: Self-pay | Admitting: *Deleted

## 2014-11-04 NOTE — Telephone Encounter (Signed)
Disregard previous message, Pt called back and stated that she initially called to let us know that the Gabapentin was not working and wanted to see if there was anything else she could have called in due to the fact that she is still having pain.  I told her I would forward to the PCP and see if there is anything he could do different and would contact her back when I heard something different. Katharina Caper, April D, [place]

## 2014-11-04 NOTE — Telephone Encounter (Signed)
LM with the sign language interpreter service for the pt to return our call to inform her of below. Katharina Caper, April D, [place]

## 2014-11-04 NOTE — Telephone Encounter (Signed)
PT called back

## 2014-11-05 NOTE — Telephone Encounter (Signed)
Start at lower dose and titrate as we discussed and as stated on her AVS. She should really make an appointment with me sooner than the end of the month. This is not a discussion to have on the phone and she needs Hb A1c.

## 2014-11-05 NOTE — Telephone Encounter (Signed)
Entered in error. Zimmerman Rumple, Kamoria Lucien D, CMA  

## 2014-11-05 NOTE — Telephone Encounter (Signed)
Spoke to pt.  She stopped taking the Gabapentin last Monday. When she starts taking it again should she start off with a lower dose or go straight to 300 mg TID? Please advise. Ottis Stain, CMA

## 2014-11-05 NOTE — Telephone Encounter (Signed)
She should increase the dose to what we agreed on to 300mg  TID. If she has done this without help she can double the dose to 600mg  TID. This medicine may take a while to work. We need an A1c.

## 2014-11-06 DIAGNOSIS — F3181 Bipolar II disorder: Secondary | ICD-10-CM | POA: Diagnosis not present

## 2014-11-07 NOTE — Telephone Encounter (Signed)
Spoke to Mrs. Pettaway. I informed her of below.  She will read the AVS and follow the directions. She said she would call and schedule an appt for earlier in the month. Ottis Stain, CMA

## 2014-12-01 ENCOUNTER — Ambulatory Visit: Payer: Self-pay | Admitting: Family Medicine

## 2014-12-15 ENCOUNTER — Telehealth: Payer: Self-pay | Admitting: Family Medicine

## 2014-12-15 NOTE — Telephone Encounter (Signed)
Pt scheduled to see a provider next week for her lower back pain. Would like advice as to how to alleviate it until then. Thank you, Fonda Kinder, ASA

## 2014-12-15 NOTE — Telephone Encounter (Signed)
Left message via sign language interpreter services for patient to call nurse if needed regarding lower back pain.  Derl Barrow, RN

## 2014-12-18 DIAGNOSIS — F3181 Bipolar II disorder: Secondary | ICD-10-CM | POA: Diagnosis not present

## 2014-12-22 ENCOUNTER — Ambulatory Visit (INDEPENDENT_AMBULATORY_CARE_PROVIDER_SITE_OTHER): Payer: Medicare PPO | Admitting: Family Medicine

## 2014-12-22 ENCOUNTER — Other Ambulatory Visit: Payer: Self-pay | Admitting: Family Medicine

## 2014-12-22 VITALS — BP 134/75 | HR 68 | Temp 98.2°F | Ht 66.0 in | Wt 190.7 lb

## 2014-12-22 DIAGNOSIS — M545 Low back pain, unspecified: Secondary | ICD-10-CM

## 2014-12-22 MED ORDER — MELOXICAM 15 MG PO TABS: 15.0000 mg | ORAL_TABLET | Freq: Every day | ORAL | Status: DC

## 2014-12-22 NOTE — Assessment & Plan Note (Signed)
Lumbar back pain most likely related to muscle spasm. Does appear to be coming from her back and not her hip but need to consider this in the future No suggestion of radiculopathy and no history of injury or trauma. Exam testing was reassuring today with no red flags - X-rays today of lumbar spine - Mobic 15 mg for 7 days with one refill - Given home modalities - Encourage ice - Follow-up in 2-3 weeks, if no improvement may consider formal physical therapy.

## 2014-12-22 NOTE — Patient Instructions (Signed)
Thank you for coming in,   Try this for the next two weeks. The mobic and home exercises.   If there is not improvement in your pain, then please follow up and we may need to consider alternative therapies.   Please bring all of your medications with you to each visit.   Sign up for My Chart to have easy access to your labs results, and communication with your Primary care physician   Please feel free to call with any questions or concerns at any time, at [phone]. --Dr. Raeford Razor RICE: Routine Care for Injuries The routine care of many injuries includes Rest, Ice, Compression, and Elevation (RICE). HOME CARE INSTRUCTIONS  Rest is needed to allow your body to heal. Routine activities can usually be resumed when comfortable. Injured tendons and bones can take up to 6 weeks to heal. Tendons are the cord-like structures that attach muscle to bone.  Ice following an injury helps keep the swelling down and reduces pain.  Put ice in a plastic bag.  Place a towel between your skin and the bag.  Leave the ice on for 15-20 minutes, 3-4 times a day, or as directed by your health care provider. Do this while awake, for the first 24 to 48 hours. After that, continue as directed by your caregiver.  Compression helps keep swelling down. It also gives support and helps with discomfort. If an elastic bandage has been applied, it should be removed and reapplied every 3 to 4 hours. It should not be applied tightly, but firmly enough to keep swelling down. Watch fingers or toes for swelling, bluish discoloration, coldness, numbness, or excessive pain. If any of these problems occur, remove the bandage and reapply loosely. Contact your caregiver if these problems continue.  Elevation helps reduce swelling and decreases pain. With extremities, such as the arms, hands, legs, and feet, the injured area should be placed near or above the level of the heart, if possible. SEEK IMMEDIATE MEDICAL CARE IF:  You have  persistent pain and swelling.  You develop redness, numbness, or unexpected weakness.  Your symptoms are getting worse rather than improving after several days. These symptoms may indicate that further evaluation or further X-rays are needed. Sometimes, X-rays may not show a small broken bone (fracture) until 1 week or 10 days later. Make a follow-up appointment with your caregiver. Ask when your X-ray results will be ready. Make sure you get your X-ray results. Document Released: 07/03/2000 Document Revised: 03/26/2013 Document Reviewed: 08/20/2010 [facility] Patient Information 2015 [place], [place]. This information is not intended to replace advice given to you by your health care provider. Make sure you discuss any questions you have with your health care provider.

## 2014-12-22 NOTE — Progress Notes (Signed)
Subjective:    Claudia Gregory - 54 y.o. female MRN [id number]  Date of birth: [birth year]  HPI  Claudia Gregory is here for back pain. Interview conducted with use of sign language interpretor.   Back Pain: Location: left lower back  Duration: two weeks  Current Functional Status:  She is having pain when she is bending over. Worse in the am and when she is mowing the lawn  Preceding Events: no falls. no trauma Alleviating Factors: ice and occasionally with aleve  Exacerbating Factors: sitting, activity  Hx of intervention: no surgery, no PT Hx of imaging: no Red Flags: no weakness, no numbness and tingling, no impaired bowel or bladder function  Health Maintenance:  Health Maintenance Due  Topic Date Due  . Hepatitis C Screening  [birth year]  . PAP SMEAR  02/24/1984  . MAMMOGRAM  02/23/2013  . INFLUENZA VACCINE  11/03/2014    -  reports that she has never smoked. She has never used smokeless tobacco. - Review of Systems: Per HPI. - Past Medical History: Patient Active Problem List   Diagnosis Date Noted  . Neuropathic pain of both feet 10/27/2014  . Hx of adenomatous polyp of colon 08/08/2014  . Deafness 08/04/2014  . GERD (gastroesophageal reflux disease) 05/13/2014  . Muscle spasm 05/13/2014  . Back pain 09/05/2013  . Hyperlipidemia 02/05/2013  . Hot flashes 11/24/2012  . Menstrual periods irregular 11/16/2012  . Chest pain, atypical 08/12/2011  . OBESITY 10/23/2008  . Hypertension 10/23/2008  . BIPOLAR DISORDER UNSPECIFIED 09/02/2008  . FATIGUE 09/02/2008   - Medications: reviewed and updated Current Outpatient Prescriptions  Medication Sig Dispense Refill  . bisacodyl (DULCOLAX) 5 MG EC tablet Take 5 mg by mouth once. For 5-2 coon then stop    . hydrOXYzine (VISTARIL) 25 MG capsule     . lisinopril-hydrochlorothiazide (PRINZIDE,ZESTORETIC) 20-25 MG per tablet TAKE 1 TABLET BY MOUTH EVERY DAY 90 tablet 1  . meloxicam (MOBIC) 15 MG tablet Take 1 tablet (15 mg  total) by mouth daily. 7 tablet 1  . pantoprazole (PROTONIX) 20 MG tablet Take 1 tablet (20 mg total) by mouth daily. 30 tablet 2  . polyethylene glycol powder (GLYCOLAX/MIRALAX) powder Take 1 Container by mouth once. For 5-2 colon then stop    . risperiDONE (RISPERDAL) 1 MG tablet Take 1 mg by mouth daily.    . sertraline (ZOLOFT) 100 MG tablet Take 100 mg by mouth daily.     No current facility-administered medications for this visit.     Review of Systems See HPI     Objective:   Physical Exam BP 134/75 mmHg  Pulse 68  Temp(Src) 98.2 F (36.8 C) (Oral)  Ht 5\' 6"  (1.676 m)  Wt 190 lb 11.2 oz (86.501 kg)  BMI 30.79 kg/m2  LMP 12/02/2014 Gen: NAD, alert, cooperative with exam,  MSK:  Back:  Appearance: No obvious deformity Palpation: No lumbar or thoracic spinal tenderness, no right sided lumbar tenderness, some pain upon palpation of the left lumbar just above the iliac crest Flexion: Full flexion  Extension: Full extension with some reproduction of pain on the left side Hip:  Appearance: No obvious deformity Palpation: No tenderness to palpation of the greater trochanter bilaterally Rotation Reduced: Normal internal and external rotation of the hips bilaterally FADIR: Negative FABER: Negative Neuro: Strength hip flexion 5/5, hip abduction 5/5 on left,  knee extension 5/5, knee flexion 5/5, dorsiflexion 5/5, plantar flexion 5/5 Reflexes: patella 2/2 Bilateral  Achilles 2/2 Bilateral Straight Leg Raise:  negative Sensation to light touch intact: yes     Assessment & Plan:   Back pain Lumbar back pain most likely related to muscle spasm. Does appear to be coming from her back and not her hip but need to consider this in the future No suggestion of radiculopathy and no history of injury or trauma. Exam testing was reassuring today with no red flags - X-rays today of lumbar spine - Mobic 15 mg for 7 days with one refill - Given home modalities - Encourage ice -  Follow-up in 2-3 weeks, if no improvement may consider formal physical therapy.

## 2015-01-20 DIAGNOSIS — F319 Bipolar disorder, unspecified: Secondary | ICD-10-CM | POA: Diagnosis not present

## 2015-02-12 ENCOUNTER — Ambulatory Visit (HOSPITAL_COMMUNITY): Payer: Medicare PPO | Admitting: Psychiatry

## 2015-02-12 DIAGNOSIS — F3181 Bipolar II disorder: Secondary | ICD-10-CM | POA: Diagnosis not present

## 2015-02-13 ENCOUNTER — Ambulatory Visit (INDEPENDENT_AMBULATORY_CARE_PROVIDER_SITE_OTHER): Payer: Medicare PPO | Admitting: Family Medicine

## 2015-02-13 VITALS — BP 129/69 | HR 70 | Temp 97.9°F | Ht 66.0 in | Wt 186.7 lb

## 2015-02-13 DIAGNOSIS — E669 Obesity, unspecified: Secondary | ICD-10-CM

## 2015-02-13 DIAGNOSIS — G629 Polyneuropathy, unspecified: Secondary | ICD-10-CM | POA: Diagnosis not present

## 2015-02-13 DIAGNOSIS — I10 Essential (primary) hypertension: Secondary | ICD-10-CM | POA: Diagnosis not present

## 2015-02-13 DIAGNOSIS — G5793 Unspecified mononeuropathy of bilateral lower limbs: Secondary | ICD-10-CM

## 2015-02-13 LAB — POCT GLYCOSYLATED HEMOGLOBIN (HGB A1C): HEMOGLOBIN A1C: 5.9

## 2015-02-13 MED ORDER — PREGABALIN 75 MG PO CAPS: 150.0000 mg | ORAL_CAPSULE | Freq: Two times a day (BID) | ORAL | Status: DC

## 2015-02-13 NOTE — Patient Instructions (Addendum)
Please schedule a follow up visit for pap smear and physical.  Avoid carbohydrates as much as possible - these include breads, pastas, and rice.  Take lyrica 75mg  twice a day to help with your foot pain and get capsaicin topical therapy to apply at night to help as well.    DASH Eating Plan DASH stands for "Dietary Approaches to Stop Hypertension." The DASH eating plan is a healthy eating plan that has been shown to reduce high blood pressure (hypertension). Additional health benefits may include reducing the risk of type 2 diabetes mellitus, heart disease, and stroke. The DASH eating plan may also help with weight loss. WHAT DO I NEED TO KNOW ABOUT THE DASH EATING PLAN? For the DASH eating plan, you will follow these general guidelines:  Choose foods with a percent daily value for sodium of less than 5% (as listed on the food label).  Use salt-free seasonings or herbs instead of table salt or sea salt.  Check with your health care provider or pharmacist before using salt substitutes.  Eat lower-sodium products, often labeled as "lower sodium" or "no salt added."  Eat fresh foods.  Eat more vegetables, fruits, and low-fat dairy products.  Choose whole grains. Look for the word "whole" as the first word in the ingredient list.  Choose fish and skinless chicken or [place] more often than red meat. Limit fish, poultry, and meat to 6 oz (170 g) each day.  Limit sweets, desserts, sugars, and sugary drinks.  Choose heart-healthy fats.  Limit cheese to 1 oz (28 g) per day.  Eat more home-cooked food and less restaurant, buffet, and fast food.  Limit fried foods.  Cook foods using methods other than frying.  Limit canned vegetables. If you do use them, rinse them well to decrease the sodium.  When eating at a restaurant, ask that your food be prepared with less salt, or no salt if possible. WHAT FOODS CAN I EAT? Seek help from a dietitian for individual calorie needs. Grains Whole  grain or whole wheat bread. Brown rice. Whole grain or whole wheat pasta. Quinoa, bulgur, and whole grain cereals. Low-sodium cereals. Corn or whole wheat flour tortillas. Whole grain cornbread. Whole grain crackers. Low-sodium crackers. Vegetables Fresh or frozen vegetables (raw, steamed, roasted, or grilled). Low-sodium or reduced-sodium tomato and vegetable juices. Low-sodium or reduced-sodium tomato sauce and paste. Low-sodium or reduced-sodium canned vegetables.  Fruits All fresh, canned (in natural juice), or frozen fruits. Meat and Other Protein Products Ground beef (85% or leaner), grass-fed beef, or beef trimmed of fat. Skinless chicken or [place]. Ground chicken or [place]. Pork trimmed of fat. All fish and seafood. Eggs. Dried beans, peas, or lentils. Unsalted nuts and seeds. Unsalted canned beans. Dairy Low-fat dairy products, such as skim or 1% milk, 2% or reduced-fat cheeses, low-fat ricotta or cottage cheese, or plain low-fat yogurt. Low-sodium or reduced-sodium cheeses. Fats and Oils Tub margarines without trans fats. Light or reduced-fat mayonnaise and salad dressings (reduced sodium). Avocado. Safflower, olive, or canola oils. Natural peanut or almond butter. Other Unsalted popcorn and pretzels.  WHAT FOODS ARE NOT RECOMMENDED? Grains White bread. White pasta. White rice. Refined cornbread. Bagels and croissants. Crackers that contain trans fat. Vegetables Creamed or fried vegetables. Vegetables in a cheese sauce. Regular canned vegetables. Regular canned tomato sauce and paste. Regular tomato and vegetable juices. Fruits Dried fruits. Canned fruit in light or heavy syrup. Fruit juice. Meat and Other Protein Products Fatty cuts of meat. Ribs, chicken wings, bacon, sausage,  bologna, salami, chitterlings, fatback, hot dogs, bratwurst, and packaged luncheon meats. Salted nuts and seeds. Canned beans with salt. Dairy Whole or 2% milk, cream, half-and-half, and cream cheese.  Whole-fat or sweetened yogurt. Full-fat cheeses or blue cheese. Nondairy creamers and whipped toppings. Processed cheese, cheese spreads, or cheese curds. Condiments Onion and garlic salt, seasoned salt, table salt, and sea salt. Canned and packaged gravies. Worcestershire sauce. Tartar sauce. Barbecue sauce. Teriyaki sauce. Soy sauce, including reduced sodium. Steak sauce. Fish sauce. Oyster sauce. Cocktail sauce. Horseradish. Ketchup and mustard. Meat flavorings and tenderizers. Bouillon cubes. Hot sauce. Tabasco sauce. Marinades. Taco seasonings. Relishes. Fats and Oils Butter, stick margarine, lard, shortening, ghee, and bacon fat. Coconut, palm kernel, or palm oils. Regular salad dressings. Other Pickles and olives. Salted popcorn and pretzels. The items listed above may not be a complete list of foods and beverages to avoid. Contact your dietitian for more information. WHERE CAN I FIND MORE INFORMATION? National Heart, Lung, and Blood Institute: travelstabloid.com

## 2015-02-13 NOTE — Progress Notes (Signed)
Subjective: Claudia Gregory is a 54 y.o. female here for hypertension follow up.  An ASL interpretor was used throughout this encounter.   Doesn't check BP at home but is 100% compliant. Exercises but is still gaining weight. Avoiding salt and carbohydrates. No dyspnea on exertion.   Endorses continued shooting/burning pain in her feet bilaterally for the past several months without back pain. Gabapentin did not help. Not a vegetarian. No h/o DM or thyroid problems. + polyuria, polydipsia. + FH DM in mother and Aunt. No vision changes.   - ROS: Denies CP, SOB, palpitations, syncope, dizziness, orthopnea, PND, frequent headaches, vision changes, claudication, leg swelling. - PMFSH: non-smoker, no EtOH, no illicit drugs. - Medications: reviewed and updated  Objective: BP 129/69 mmHg  Pulse 70  Temp(Src) 97.9 F (36.6 C) (Oral)  Ht 5\' 6"  (1.676 m)  Wt 186 lb 11.2 oz (84.687 kg)  BMI 30.15 kg/m2 Gen: Well-appearing 54 y.o.female in no distress Neck: No bruits; thyroid not enlarged  Pulm: Non-labored breathing room air; CTAB CV: Regular rate with normal S1/S2, no murmur; no LE edema, no JVD; DP and radial pulses symmetric and 2+. cap refill < 2 sec.  GI: Nontender, nondistended, no HSM  Feet: No deformities or skin abnormalities. Sensation intact. pulses 2+.  Assessment & Plan: Claudia Gregory is a 55 y.o. female here for hypertension and peripheral neuropathic-type pain.   Neuropathic pain of both feet Check Hb A1c. Rx lyrica as gabapentin did not help at therapeutic dose. Also use capsaicin qHS.   Hypertension At goal. Continue tx.

## 2015-02-13 NOTE — Assessment & Plan Note (Signed)
At goal. Continue tx.

## 2015-02-13 NOTE — Assessment & Plan Note (Signed)
Check Hb A1c. Rx lyrica as gabapentin did not help at therapeutic dose. Also use capsaicin qHS.

## 2015-02-17 ENCOUNTER — Telehealth: Payer: Self-pay | Admitting: Family Medicine

## 2015-02-17 ENCOUNTER — Encounter: Payer: Self-pay | Admitting: Family Medicine

## 2015-02-17 NOTE — Telephone Encounter (Signed)
Pt called and would like to know what her lab results were. jw

## 2015-02-17 NOTE — Telephone Encounter (Signed)
Could you please let her know that the hemoglobin A1c shows that she does NOT have diabetes. A letter has been sent to both her and her husband with the print outs of their results. She will need to schedule a follow up in 4 weeks or so to discuss medication for her pain. See other note pertaining to her husband.

## 2015-02-18 NOTE — Telephone Encounter (Signed)
Called, LM for pt to call the office. Ottis Stain, CMA

## 2015-02-19 NOTE — Telephone Encounter (Signed)
Message relayed by front office/Jackie. Katharina Caper, April D, [place]

## 2015-03-11 ENCOUNTER — Ambulatory Visit (HOSPITAL_COMMUNITY): Payer: Medicare PPO | Admitting: Psychiatry

## 2015-03-16 DIAGNOSIS — Z79899 Other long term (current) drug therapy: Secondary | ICD-10-CM | POA: Diagnosis not present

## 2015-03-17 ENCOUNTER — Other Ambulatory Visit: Payer: Self-pay | Admitting: Family Medicine

## 2015-03-17 DIAGNOSIS — G5793 Unspecified mononeuropathy of bilateral lower limbs: Secondary | ICD-10-CM

## 2015-03-17 MED ORDER — PREGABALIN 75 MG PO CAPS: 150.0000 mg | ORAL_CAPSULE | Freq: Two times a day (BID) | ORAL | Status: DC

## 2015-03-17 NOTE — Telephone Encounter (Signed)
Pt called and needs a refill on her Lyrica. jw

## 2015-03-18 ENCOUNTER — Telehealth: Payer: Self-pay | Admitting: *Deleted

## 2015-03-18 DIAGNOSIS — G5793 Unspecified mononeuropathy of bilateral lower limbs: Secondary | ICD-10-CM

## 2015-03-18 NOTE — Telephone Encounter (Signed)
Prior Authorization received from [facility] for Lyrica 75 mg. Formulary and PA form placed in provider box for completion. Derl Barrow, RN

## 2015-03-18 NOTE — Telephone Encounter (Signed)
Received a fax from [facility] stating patient's insurance will not cover the Rx Lyrica 75 mg 2 capsules twice daily.  The plan only covers 2 capsules per day.  They are requesting to change to Lyrica 150 mg 1 capsul twice daily.  Please advise.  Derl Barrow, RN

## 2015-03-20 MED ORDER — PREGABALIN 150 MG PO CAPS: 150.0000 mg | ORAL_CAPSULE | Freq: Two times a day (BID) | ORAL | Status: DC

## 2015-03-20 NOTE — Telephone Encounter (Signed)
That Rx was phoned in, not printed. I have printed the new version (with 150mg  capsules) and left it up front for pick up.

## 2015-03-20 NOTE — Telephone Encounter (Signed)
Previous order changed to lyrica 150mg  1 cap po BID, 3 month supply. Printed and will have it up front for pick up.

## 2015-03-24 NOTE — Telephone Encounter (Signed)
LM for patient that new script is ready for pick up. Norelle Runnion,CMA

## 2015-03-25 DIAGNOSIS — F3181 Bipolar II disorder: Secondary | ICD-10-CM | POA: Diagnosis not present

## 2015-04-01 ENCOUNTER — Ambulatory Visit (INDEPENDENT_AMBULATORY_CARE_PROVIDER_SITE_OTHER): Payer: Medicare PPO | Admitting: Family Medicine

## 2015-04-01 ENCOUNTER — Other Ambulatory Visit: Payer: Self-pay | Admitting: Family Medicine

## 2015-04-01 ENCOUNTER — Encounter: Payer: Self-pay | Admitting: Family Medicine

## 2015-04-01 VITALS — BP 150/79 | HR 66 | Temp 98.0°F | Wt 195.0 lb

## 2015-04-01 DIAGNOSIS — M722 Plantar fascial fibromatosis: Secondary | ICD-10-CM

## 2015-04-01 MED ORDER — MELOXICAM 15 MG PO TABS: 15.0000 mg | ORAL_TABLET | Freq: Every day | ORAL | Status: DC | PRN

## 2015-04-01 NOTE — Progress Notes (Signed)
Subjective: Claudia Gregory is a 53 y.o. female here for hypertension follow up.  An ASL interpretor was used throughout this encounter.   She reports sharp, severe pain in the bottom of her right foot worse with the first step in the morning, better throughout the day and when off her feet. No injury. She has been walking much more now that she has more energy and the shooting/burning pain in her feet has gone. She believes lyrica has helped her with the pain and with mood.   - [place]: non-smoker, no EtOH, no illicit drugs. - Medications: reviewed and updated  Objective: BP 150/79 mmHg  Pulse 66  Temp(Src) 98 F (36.7 C) (Oral)  Wt 195 lb (88.451 kg) Gen: Well-appearing 54 y.o.female in no distress Feet: No deformities or skin abnormalities. Sensation intact. pulses 2+. Pain on palpation of the calcaneal insertion of the plantar fascia. No ankle swelling.   Assessment & Plan: Claudia Gregory is a 54 y.o. female here for plantar fasciitis.   NSAIDs, ice, stretching advised for plantar fasciitis. No indication for imaging. Will have pap smear and mammogram ordered/performed by GYN.

## 2015-04-01 NOTE — Patient Instructions (Signed)
Plantar Fasciitis Plantar fasciitis is a painful foot condition that affects the heel. It occurs when the band of tissue that connects the toes to the heel bone (plantar fascia) becomes irritated. This can happen after exercising too much or doing other repetitive activities (overuse injury). The pain from plantar fasciitis can range from mild irritation to severe pain that makes it difficult for you to walk or move. The pain is usually worse in the morning or after you have been sitting or lying down for a while. CAUSES This condition may be caused by:  Standing for long periods of time.  Wearing shoes that do not fit.  Doing high-impact activities, including running, aerobics, and ballet.  Being overweight.  Having an abnormal way of walking (gait).  Having tight calf muscles.  Having high arches in your feet.  Starting a new athletic activity. SYMPTOMS The main symptom of this condition is heel pain. Other symptoms include:  Pain that gets worse after activity or exercise.  Pain that is worse in the morning or after resting.  Pain that goes away after you walk for a few minutes. DIAGNOSIS This condition may be diagnosed based on your signs and symptoms. Your health care provider will also do a physical exam to check for:  A tender area on the bottom of your foot.  A high arch in your foot.  Pain when you move your foot.  Difficulty moving your foot. You may also need to have imaging studies to confirm the diagnosis. These can include:  X-rays.  Ultrasound.  MRI. TREATMENT  Treatment for plantar fasciitis depends on the severity of the condition. Your treatment may include:  Rest, ice, and over-the-counter pain medicines to manage your pain.  Exercises to stretch your calves and your plantar fascia.  A splint that holds your foot in a stretched, upward position while you sleep (night splint).  Physical therapy to relieve symptoms and prevent problems in the  future.  Cortisone injections to relieve severe pain.  Extracorporeal shock wave therapy (ESWT) to stimulate damaged plantar fascia with electrical impulses. It is often used as a last resort before surgery.  Surgery, if other treatments have not worked after 12 months. HOME CARE INSTRUCTIONS  Take medicines only as directed by your health care provider.  Avoid activities that cause pain.  Roll the bottom of your foot over a bag of ice or a bottle of cold water. Do this for 20 minutes, 3-4 times a day.  Perform simple stretches as directed by your health care provider.  Try wearing athletic shoes with air-sole or gel-sole cushions or soft shoe inserts.  Wear a night splint while sleeping, if directed by your health care provider.  Keep all follow-up appointments with your health care provider. PREVENTION   Do not perform exercises or activities that cause heel pain.  Consider finding low-impact activities if you continue to have problems.  Lose weight if you need to. The best way to prevent plantar fasciitis is to avoid the activities that aggravate your plantar fascia. SEEK MEDICAL CARE IF:  Your symptoms do not go away after treatment with home care measures.  Your pain gets worse.  Your pain affects your ability to move or do your daily activities.   This information is not intended to replace advice given to you by your health care provider. Make sure you discuss any questions you have with your health care provider.   Document Released: 12/14/2000 Document Revised: 12/10/2014 Document Reviewed: 01/29/2014 Elsevier   Interactive Patient Education 2016 [employer].  

## 2015-04-09 DIAGNOSIS — F3181 Bipolar II disorder: Secondary | ICD-10-CM | POA: Diagnosis not present

## 2015-04-20 DIAGNOSIS — Z124 Encounter for screening for malignant neoplasm of cervix: Secondary | ICD-10-CM | POA: Diagnosis not present

## 2015-04-20 DIAGNOSIS — R635 Abnormal weight gain: Secondary | ICD-10-CM | POA: Diagnosis not present

## 2015-04-20 DIAGNOSIS — Z6832 Body mass index (BMI) 32.0-32.9, adult: Secondary | ICD-10-CM | POA: Diagnosis not present

## 2015-05-14 DIAGNOSIS — D251 Intramural leiomyoma of uterus: Secondary | ICD-10-CM | POA: Diagnosis not present

## 2015-05-14 DIAGNOSIS — Z1231 Encounter for screening mammogram for malignant neoplasm of breast: Secondary | ICD-10-CM | POA: Diagnosis not present

## 2015-05-22 ENCOUNTER — Telehealth: Payer: Self-pay | Admitting: Family Medicine

## 2015-05-22 NOTE — Telephone Encounter (Signed)
Needs a drs note written for her heel pain.  She needs to show her boss something in writing about her heel pain.  When she stands for a long time, her heal hurts. She needs special accomadations Please let pt know when it is ready for pickup. Needs to give it to her boss on Monday. Would like to pick this up this afternoon  Can only be texted at work

## 2015-05-25 NOTE — Telephone Encounter (Signed)
Pt informed and will pick letter up on 05/26/15. Katharina Caper, Cayleen Benjamin D, [place]

## 2015-05-27 DIAGNOSIS — F3181 Bipolar II disorder: Secondary | ICD-10-CM | POA: Diagnosis not present

## 2015-06-02 ENCOUNTER — Encounter: Payer: Self-pay | Admitting: Internal Medicine

## 2015-06-15 ENCOUNTER — Other Ambulatory Visit: Payer: Self-pay | Admitting: Family Medicine

## 2015-06-23 DIAGNOSIS — F319 Bipolar disorder, unspecified: Secondary | ICD-10-CM | POA: Diagnosis not present

## 2015-07-29 DIAGNOSIS — F3181 Bipolar II disorder: Secondary | ICD-10-CM | POA: Diagnosis not present

## 2015-07-30 ENCOUNTER — Ambulatory Visit (INDEPENDENT_AMBULATORY_CARE_PROVIDER_SITE_OTHER): Payer: Medicare PPO | Admitting: Internal Medicine

## 2015-07-30 ENCOUNTER — Other Ambulatory Visit (INDEPENDENT_AMBULATORY_CARE_PROVIDER_SITE_OTHER): Payer: Medicare PPO

## 2015-07-30 ENCOUNTER — Encounter: Payer: Self-pay | Admitting: Internal Medicine

## 2015-07-30 VITALS — BP 108/66 | HR 72 | Ht 66.14 in | Wt 196.1 lb

## 2015-07-30 DIAGNOSIS — R140 Abdominal distension (gaseous): Secondary | ICD-10-CM

## 2015-07-30 DIAGNOSIS — E611 Iron deficiency: Secondary | ICD-10-CM

## 2015-07-30 DIAGNOSIS — R635 Abnormal weight gain: Secondary | ICD-10-CM | POA: Diagnosis not present

## 2015-07-30 LAB — CBC WITH DIFFERENTIAL/PLATELET
BASOS ABS: 0 10*3/uL (ref 0.0–0.1)
Basophils Relative: 0.3 % (ref 0.0–3.0)
EOS PCT: 1.5 % (ref 0.0–5.0)
Eosinophils Absolute: 0.1 10*3/uL (ref 0.0–0.7)
HCT: 40.2 % (ref 36.0–46.0)
HEMOGLOBIN: 13.9 g/dL (ref 12.0–15.0)
Lymphocytes Relative: 22.7 % (ref 12.0–46.0)
Lymphs Abs: 1.7 10*3/uL (ref 0.7–4.0)
MCHC: 34.5 g/dL (ref 30.0–36.0)
MCV: 86.2 fl (ref 78.0–100.0)
MONO ABS: 0.5 10*3/uL (ref 0.1–1.0)
MONOS PCT: 6.7 % (ref 3.0–12.0)
NEUTROS PCT: 68.8 % (ref 43.0–77.0)
Neutro Abs: 5.1 10*3/uL (ref 1.4–7.7)
PLATELETS: 262 10*3/uL (ref 150.0–400.0)
RBC: 4.67 Mil/uL (ref 3.87–5.11)
RDW: 12.6 % (ref 11.5–15.5)
WBC: 7.5 10*3/uL (ref 4.0–10.5)

## 2015-07-30 LAB — COMPREHENSIVE METABOLIC PANEL
ALBUMIN: 4.5 g/dL (ref 3.5–5.2)
ALK PHOS: 71 U/L (ref 39–117)
ALT: 59 U/L — AB (ref 0–35)
AST: 33 U/L (ref 0–37)
BILIRUBIN TOTAL: 0.4 mg/dL (ref 0.2–1.2)
BUN: 13 mg/dL (ref 6–23)
CO2: 31 mEq/L (ref 19–32)
Calcium: 10.5 mg/dL (ref 8.4–10.5)
Chloride: 101 mEq/L (ref 96–112)
Creatinine, Ser: 0.65 mg/dL (ref 0.40–1.20)
GFR: 101.57 mL/min (ref >60.00)
Glucose, Bld: 125 mg/dL — ABNORMAL HIGH (ref 70–99)
POTASSIUM: 3.9 meq/L (ref 3.5–5.1)
Sodium: 138 mEq/L (ref 135–145)
TOTAL PROTEIN: 7.8 g/dL (ref 6.0–8.3)

## 2015-07-30 LAB — TSH: TSH: 2.69 u[IU]/mL (ref 0.35–4.50)

## 2015-07-30 LAB — FERRITIN: Ferritin: 59.2 ng/mL (ref 10.0–291.0)

## 2015-07-30 NOTE — Patient Instructions (Signed)
  Your physician has requested that you go to the basement for the lab work before leaving today.    You have been scheduled for an abdominal ultrasound at [facility]iology (1st floor of hospital) on 08/03/15 at 7:30AM. Please arrive 15 minutes prior to your appointment for registration. Make certain not to have anything to eat or drink 6 hours prior to your appointment. Should you need to reschedule your appointment, please contact radiology at [phone]. This test typically takes about 30 minutes to perform.  I appreciate the opportunity to care for you.

## 2015-07-30 NOTE — Progress Notes (Signed)
Referred by: Marylynn Pearson M.D.  Subjective:    Patient ID: Claudia Gregory, female    DOB: Mar 03, 1961, 55 y.o.   MRN: [id number] Chief complaint: Bloating and weight gain HPI The patient is a very nice 55 year old married woman with deafness and bipolar disorder who is complaining of weight gain, abdominal distention and bloating that started in the fall of last year. She says she is having increasing dyspnea on exertion, she feels bloated she is having more gas symptoms as well with flatus. Perhaps some increase in heartburn. He has seen gynecology in February of this year and was noted to have small fibroids in the uterus no free fluid. Smaller uterus than she did in 2013 and normal-appearing ovaries. She describes some intermittent pedal edema. He is trying to lose weight by changing her diet she eats a lot of salads. However she is frustrated by persistent weight gain of about 10 pounds since last fall. She did start Lyrica in November, the treat neuropathic foot pain after gabapentin did not help.  Sign language interpreter is used today communicate. No Known Allergies Outpatient Prescriptions Prior to Visit  Medication Sig Dispense Refill  . lisinopril-hydrochlorothiazide (PRINZIDE,ZESTORETIC) 20-25 MG tablet TAKE 1 TABLET BY MOUTH DAILY 90 tablet 0  . pregabalin (LYRICA) 150 MG capsule Take 1 capsule (150 mg total) by mouth 2 (two) times daily. 180 capsule 1  . sertraline (ZOLOFT) 100 MG tablet Take 100 mg by mouth daily.    .      .      .      .      .      . risperiDONE (RISPERDAL) 1 MG tablet Take 1 mg by mouth daily.     No facility-administered medications prior to visit.   Past Medical History  Diagnosis Date  . OBESITY   . FATIGUE   . Chest pain, atypical   . Carpal tunnel syndrome of left wrist   . Hypertension   . Bipolar 1 disorder ([place])   . Deaf   . GERD (gastroesophageal reflux disease)   . Hx of adenomatous polyp of colon 08/08/2014  . Anxiety   . Depression    . Gallstones   . Pneumonia    Past Surgical History  Procedure Laterality Date  . Tubal ligation  1998  . Cholecystectomy  2000  . Colonoscopy  2016    5 mm adenoma   Social History   Social History  . Marital Status: Married    Spouse Name: N/A  . Number of Children: 2  . Years of Education: N/A   Occupational History  . laundry prep    Social History Main Topics  . Smoking status: Never Smoker   . Smokeless tobacco: Never Used  . Alcohol Use: 1.2 oz/week    0 Standard drinks or equivalent, 2 Glasses of wine per week     Comment: rare glass of wine   . Drug Use: No  . Sexual Activity: Not Asked   Other Topics Concern  . None   Social History Narrative   Married 2 sons she works Oceanographer at [employer] eats 2 caffeinated beverages daily  2 alcoholic drinks daily   [id number]          Family History  Problem Relation Age of Onset  . Colon cancer Neg Hx   . Rectal cancer Neg Hx   . Stomach cancer Neg Hx   . Brain cancer Mother   . Hypertension Mother   .  Diabetes Mother   . Diabetes Maternal Aunt   . Heart attack Maternal Grandmother   . Hypertension Maternal Grandmother    Review of Systems As per history of present illness. She does not have significant fatigue. She has some urinary leakage and urinary frequency at times, night sweats, muscle pains at times back pain and has intermittent symptoms of anxiety and depression though overall she feels like her mental illness is controlled.    Objective:   Physical Exam @BP  108/66 mmHg  Pulse 72  Ht 5' 6.14" (1.68 m)  Wt 196 lb 2 oz (88.962 kg)  BMI 31.52 kg/m2  LMP 11/02/2014@  General:  Well-developed, well-nourished and in no acute distressShe is deaf and uses sign language Eyes:  anicteric. ENT:   Mouth and posterior pharynx free of lesions.  Neck:   supple w/o thyromegaly or mass.  Lungs: Clear to auscultation bilaterally. Heart:  S1S2, no rubs, murmurs, gallops. I do not see any jugular venous  distention Abdomen:  Moderately obese soft, non-tender, no hepatosplenomegaly, hernia, or mass and BS+. There is some question of prominent abdominal wall veins particular in the right side, she also has stria Lymph:  no cervical or supraclavicular adenopathy. Extremities:   no edema, cyanosis or clubbing Skin   no rash. At least Least 2 tattoos low back right ankle, other than the abdominal wall findings I see no stigmata of chronic liver disease Neuro:  A&O x 3.  Psych:  appropriate mood and  Affect.   Data Reviewed: Wt Readings from Last 3 Encounters:  07/30/15 196 lb 2 oz (88.962 kg)  04/01/15 195 lb (88.451 kg)  02/13/15 186 lb 11.2 oz (84.687 kg)   She had a high TIBC and low iron saturation but normal hemoglobin in November 2016 Primary care notes from late 2016 gynecology notes from February 2017, colonoscopy May 2016 with one adenoma 5 mm and some diverticulosis in the left colon I performed  Chest x-ray 2016 normal images viewed    Assessment & Plan:   Encounter Diagnoses  Name Primary?  . Bloating Yes  . Weight gain   . Iron deficiency     It could be the Lyrica causing these problems. Time course is consistent with a 10 pound weight gain and the initiation of Lyrica in November 2016. Some question of liver disease, ultrasound will help sort that out. They did not see ascites on her pelvic ultrasound that she could have some up higher. Overall I suspect that the Lyrica, wrist per Kirk Ruths also cause weight gain that she's been on that chronically and it controls her bipolar disorder and cannot be changed. I think the Lyrica helps her neuropathic foot pain so that I could be problematic changing that. She is quite distressed by the weight gain. He does not feel well since this. Other options for weight loss could be tried.  I'm going to check thyroid testing as well she had a high normal TSH last year and hypothyroidism could be at play here. , also CBC and a ferritin she has  had those iron indices suggestive of iron deficiency but not anemia then. Metabolic panel checked also in workup of these problems.  Consider a screening for HCV as well. She is almost in that age range. She has had tattoos also.  I appreciate the opportunity to care for this patient.  CC: Vance Gather, MD Marylynn Pearson M.D.

## 2015-08-03 ENCOUNTER — Ambulatory Visit (HOSPITAL_COMMUNITY): Payer: Medicare PPO

## 2015-08-03 NOTE — Progress Notes (Signed)
Quick Note:  Labs show slight liver chemistry abnormality, elevated non-fasting glucose Await [place] 5/5 ______

## 2015-08-04 ENCOUNTER — Ambulatory Visit (HOSPITAL_COMMUNITY): Payer: Medicare PPO

## 2015-08-07 ENCOUNTER — Ambulatory Visit (HOSPITAL_COMMUNITY): Payer: Medicare PPO

## 2015-08-26 DIAGNOSIS — F3181 Bipolar II disorder: Secondary | ICD-10-CM | POA: Diagnosis not present

## 2015-09-07 ENCOUNTER — Ambulatory Visit (HOSPITAL_COMMUNITY): Admission: RE | Admit: 2015-09-07 | Payer: Medicare PPO | Source: Ambulatory Visit

## 2015-09-14 ENCOUNTER — Ambulatory Visit: Payer: Medicare PPO | Admitting: Family Medicine

## 2015-09-17 ENCOUNTER — Ambulatory Visit (HOSPITAL_COMMUNITY): Payer: Medicare PPO

## 2015-09-24 ENCOUNTER — Ambulatory Visit (INDEPENDENT_AMBULATORY_CARE_PROVIDER_SITE_OTHER): Payer: Medicare PPO | Admitting: Family Medicine

## 2015-09-24 ENCOUNTER — Encounter: Payer: Self-pay | Admitting: Family Medicine

## 2015-09-24 ENCOUNTER — Ambulatory Visit (HOSPITAL_COMMUNITY)
Admission: RE | Admit: 2015-09-24 | Discharge: 2015-09-24 | Disposition: A | Discharge status: Home / Self Care | Payer: Medicare PPO | Source: Ambulatory Visit | Attending: Internal Medicine | Admitting: Internal Medicine

## 2015-09-24 VITALS — BP 126/66 | HR 66 | Temp 97.6°F | Ht 66.0 in | Wt 199.0 lb

## 2015-09-24 DIAGNOSIS — Z9049 Acquired absence of other specified parts of digestive tract: Secondary | ICD-10-CM | POA: Insufficient documentation

## 2015-09-24 DIAGNOSIS — R140 Abdominal distension (gaseous): Secondary | ICD-10-CM | POA: Diagnosis not present

## 2015-09-24 DIAGNOSIS — R932 Abnormal findings on diagnostic imaging of liver and biliary tract: Secondary | ICD-10-CM | POA: Diagnosis not present

## 2015-09-24 DIAGNOSIS — G5793 Unspecified mononeuropathy of bilateral lower limbs: Secondary | ICD-10-CM

## 2015-09-24 DIAGNOSIS — R635 Abnormal weight gain: Secondary | ICD-10-CM | POA: Diagnosis not present

## 2015-09-24 DIAGNOSIS — I10 Essential (primary) hypertension: Secondary | ICD-10-CM | POA: Diagnosis not present

## 2015-09-24 DIAGNOSIS — G629 Polyneuropathy, unspecified: Secondary | ICD-10-CM

## 2015-09-24 NOTE — Progress Notes (Signed)
Subjective: Claudia Gregory is a 55 y.o. female here for hypertension follow up.  An ASL interpretor was used throughout this encounter.   She has been seen for bloating/weight gain since our last visit, reporting 20 lbs weight gain since Nov when we started lyrica. She is being evaluated by GI for this. She rarely checks BP at home. Misses zero doses of lisinopril/HCTZ combination tablet out of 7 days. is exercising weekly and is adherent to a low-salt diet. she is able to walk indefinitely without dyspnea.   - ROS: Denies CP, SOB, palpitations, syncope, dizziness, orthopnea, PND, frequent headaches, vision changes, claudication, leg swelling. - PMFSH: Married, non-smoker, rare EtOH, no illicit drugs. - Medications: reviewed and updated  Objective: BP 126/66 mmHg  Pulse 66  Temp(Src) 97.6 F (36.4 C) (Oral)  Ht 5\' 6"  (1.676 m)  Wt 199 lb (90.266 kg)  BMI 32.13 kg/m2  Wt Readings from Last 3 Encounters:  09/24/15 199 lb (90.266 kg)  07/30/15 196 lb 2 oz (88.962 kg)  04/01/15 195 lb (88.451 kg)   Gen: Well-appearing 55 y.o.female in no distress CV: Regular rate, no murmur; radial, DP and PT pulses 2+ bilaterally; no LE edema, no JVD, cap refill < 2 sec. Pulm: Non-labored breathing ambient air; CTAB, no wheezes or crackles GI: Normoactive-BS; soft, non-tender, non-distended, no organomegaly, no hernia appreciated  Assessment & Plan: Claudia Gregory is a 55 y.o. female here for well-controlled HTN.   See problem list.   - Mammogram and pap smear performed by GYN. Results reported as negative, will obtain records.

## 2015-09-24 NOTE — Assessment & Plan Note (Signed)
No evidence of diabetes or thyroid dysfunction to explain this. Lyrica offers significant benefit, though may have something to do with weight gain (which by our scale is only 4 lbs, not 20 as asserted by pt) but pt and I agree that benefits outweigh these side effects. If work up for bloating by GI yields no results, will consider replacing this, though she's not had benefit with gabapentin.

## 2015-09-24 NOTE — Patient Instructions (Signed)
We made no medication changes today. Your last labs looked ok.  - High blood pressure is best treated by losing weight, taking medications as directed, avoiding salt in your diet and increasing potassium from fruits and vegetables (called the DASH diet). - If you feel faint, experience new/worsening chest pain or shortness of breath, or notice rapid leg swelling and/or weight gain you should call the clinic at [phone] OR go directly to the ER.  - If the evaluation by the gastroenterologist, Dr. Carlean Purl, yields no cause of your weight gain, it would be reasonable to try coming off lyrica, though this has offered such benefit in your symptoms that I think you should discuss this with your next doctor prior to doing that.   Take care,  - Dr. Bonner Puna  DASH Eating Plan DASH stands for "Dietary Approaches to Stop Hypertension." The DASH eating plan is a healthy eating plan that has been shown to reduce high blood pressure (hypertension). Additional health benefits may include reducing the risk of type 2 diabetes mellitus, heart disease, and stroke. The DASH eating plan may also help with weight loss. WHAT DO I NEED TO KNOW ABOUT THE DASH EATING PLAN? For the DASH eating plan, you will follow these general guidelines:  Choose foods with a percent daily value for sodium of less than 5% (as listed on the food label).  Use salt-free seasonings or herbs instead of table salt or sea salt.  Check with your health care provider or pharmacist before using salt substitutes.  Eat lower-sodium products, often labeled as "lower sodium" or "no salt added."  Eat fresh foods.  Eat more vegetables, fruits, and low-fat dairy products.  Choose whole grains. Look for the word "whole" as the first word in the ingredient list.  Choose fish and skinless chicken or [place] more often than red meat. Limit fish, poultry, and meat to 6 oz (170 g) each day.  Limit sweets, desserts, sugars, and sugary drinks.  Choose  heart-healthy fats.  Limit cheese to 1 oz (28 g) per day.  Eat more home-cooked food and less restaurant, buffet, and fast food.  Limit fried foods.  Cook foods using methods other than frying.  Limit canned vegetables. If you do use them, rinse them well to decrease the sodium.  When eating at a restaurant, ask that your food be prepared with less salt, or no salt if possible. WHAT FOODS CAN I EAT? Seek help from a dietitian for individual calorie needs. Grains Whole grain or whole wheat bread. Brown rice. Whole grain or whole wheat pasta. Quinoa, bulgur, and whole grain cereals. Low-sodium cereals. Corn or whole wheat flour tortillas. Whole grain cornbread. Whole grain crackers. Low-sodium crackers. Vegetables Fresh or frozen vegetables (raw, steamed, roasted, or grilled). Low-sodium or reduced-sodium tomato and vegetable juices. Low-sodium or reduced-sodium tomato sauce and paste. Low-sodium or reduced-sodium canned vegetables.  Fruits All fresh, canned (in natural juice), or frozen fruits. Meat and Other Protein Products Ground beef (85% or leaner), grass-fed beef, or beef trimmed of fat. Skinless chicken or [place]. Ground chicken or [place]. Pork trimmed of fat. All fish and seafood. Eggs. Dried beans, peas, or lentils. Unsalted nuts and seeds. Unsalted canned beans. Dairy Low-fat dairy products, such as skim or 1% milk, 2% or reduced-fat cheeses, low-fat ricotta or cottage cheese, or plain low-fat yogurt. Low-sodium or reduced-sodium cheeses. Fats and Oils Tub margarines without trans fats. Light or reduced-fat mayonnaise and salad dressings (reduced sodium). Avocado. Safflower, olive, or canola oils. Natural peanut  or almond butter. Other Unsalted popcorn and pretzels.  WHAT FOODS ARE NOT RECOMMENDED? Grains White bread. White pasta. White rice. Refined cornbread. Bagels and croissants. Crackers that contain trans fat. Vegetables Creamed or fried vegetables. Vegetables in a  cheese sauce. Regular canned vegetables. Regular canned tomato sauce and paste. Regular tomato and vegetable juices. Fruits Dried fruits. Canned fruit in light or heavy syrup. Fruit juice. Meat and Other Protein Products Fatty cuts of meat. Ribs, chicken wings, bacon, sausage, bologna, salami, chitterlings, fatback, hot dogs, bratwurst, and packaged luncheon meats. Salted nuts and seeds. Canned beans with salt. Dairy Whole or 2% milk, cream, half-and-half, and cream cheese. Whole-fat or sweetened yogurt. Full-fat cheeses or blue cheese. Nondairy creamers and whipped toppings. Processed cheese, cheese spreads, or cheese curds. Condiments Onion and garlic salt, seasoned salt, table salt, and sea salt. Canned and packaged gravies. Worcestershire sauce. Tartar sauce. Barbecue sauce. Teriyaki sauce. Soy sauce, including reduced sodium. Steak sauce. Fish sauce. Oyster sauce. Cocktail sauce. Horseradish. Ketchup and mustard. Meat flavorings and tenderizers. Bouillon cubes. Hot sauce. Tabasco sauce. Marinades. Taco seasonings. Relishes. Fats and Oils Butter, stick margarine, lard, shortening, ghee, and bacon fat. Coconut, palm kernel, or palm oils. Regular salad dressings. Other Pickles and olives. Salted popcorn and pretzels. The items listed above may not be a complete list of foods and beverages to avoid. Contact your dietitian for more information. WHERE CAN I FIND MORE INFORMATION? National Heart, Lung, and Blood Institute: travelstabloid.com

## 2015-09-24 NOTE — Assessment & Plan Note (Signed)
At goal without hypotension, tolerating medications. Will continue therapy - last renal function wnl.

## 2015-09-25 ENCOUNTER — Telehealth: Payer: Self-pay | Admitting: Internal Medicine

## 2015-09-25 NOTE — Telephone Encounter (Signed)
Pt has been notified that no results are available as of today and we will call as soon as resulted

## 2015-09-28 ENCOUNTER — Telehealth: Payer: Self-pay | Admitting: Gastroenterology

## 2015-09-30 NOTE — Telephone Encounter (Signed)
No answer and voice mail full , Dr Carlean Purl will be back on Monday to review.

## 2015-09-30 NOTE — Telephone Encounter (Signed)
Pt has been notified that Dr Carlean Purl is out of the office until Monday and we will contact her as soon as reviewed

## 2015-10-07 NOTE — Progress Notes (Signed)
Quick Note:  US shows fatty liver - extra fat in liver - common and not usually a major problem Please let her know this FYI she is deaf but I believe phone shows words  F/u GI prn I see she had PCP f/u 7/14 to review possible side effects of Lyrica that I suspected ______

## 2015-10-12 ENCOUNTER — Other Ambulatory Visit: Payer: Self-pay | Admitting: *Deleted

## 2015-10-12 DIAGNOSIS — I10 Essential (primary) hypertension: Secondary | ICD-10-CM

## 2015-10-12 MED ORDER — LISINOPRIL-HYDROCHLOROTHIAZIDE 20-25 MG PO TABS: 1.0000 | ORAL_TABLET | Freq: Every day | ORAL | Status: DC

## 2015-10-13 ENCOUNTER — Telehealth: Payer: Self-pay | Admitting: Internal Medicine

## 2015-10-13 NOTE — Telephone Encounter (Signed)
Left message for patient to call back  

## 2015-10-13 NOTE — Telephone Encounter (Signed)
I reviewed the results with the patient again.  All questions about Fatty liver answered.  She will call back for any additional questions or concerns.

## 2015-10-16 ENCOUNTER — Encounter: Payer: Self-pay | Admitting: Student

## 2015-10-16 ENCOUNTER — Ambulatory Visit (INDEPENDENT_AMBULATORY_CARE_PROVIDER_SITE_OTHER): Payer: Medicare PPO | Admitting: Student

## 2015-10-16 VITALS — BP 111/54 | HR 72 | Temp 98.3°F | Wt 196.4 lb

## 2015-10-16 DIAGNOSIS — T887XXA Unspecified adverse effect of drug or medicament, initial encounter: Secondary | ICD-10-CM | POA: Diagnosis not present

## 2015-10-16 DIAGNOSIS — T50905A Adverse effect of unspecified drugs, medicaments and biological substances, initial encounter: Secondary | ICD-10-CM

## 2015-10-16 NOTE — Progress Notes (Signed)
   Subjective:    Patient ID: Claudia Gregory, female    DOB: 02-20-1961, 55 y.o.   MRN: [id number]  CC: problem with lyrica  Patient is accompanied by sign interpreter for this encounter.  HPI #Problem with lyrica: She states Lyrica is causing her to gain weight. She reports minimal benefit from Lyrica. She has already cut down on her Lyrica from twice a day to once a day. She says her pain is more or less well controlled. She is asking if she can stop taking Lyrica.  She also started going to [employer] to lose weight. She is also working on her diet.   #Fatty Infiltration liver: this was noted on her recent Ultrasound of abdomen. She was seen by gastroenterologist who wasn't impressed by the finding.   Review of Systems Objective:   Physical Exam Filed Vitals:   10/16/15 1343 10/16/15 1411  BP: 143/65 111/54  Pulse: 60 72  Temp: 98.3 F (36.8 C)   TempSrc: Oral   Weight: 196 lb 6.4 oz (89.086 kg)   SpO2: 96%     GEN: appears well, NAD Oropharynx: clear, moist CVS: RRR, normal s1 and s2, no murmurs, no edema RESP: no increased work of breathing, good air movement bilaterally, no crackles or wheeze GI: soft, non-tender,non-distended, +BS NEURO: A&O x3, no gross defecits  PSYCH: appropriate mood and affect     Assessment & Plan:  Medication side effects Concerned about weight gain since she started Lyrica. She reports little benefits from this medication. She has already cut down to once daily from twice daily. She said her pain is bearable. She doesn't think she should continue taking the Lyrica. So we have stopped Lyrica today. Lyrica is known to cause weight gain and edema in about 16% cases.   Fatty infiltration of the liver: Seen on ultrasound done on 09/24/2015. This was seen by gastroenterologist who was not impressed very much. I have reassured her about the finding. I have also discussed about avoiding medication such as Tylenol which could make this worse.

## 2015-10-16 NOTE — Patient Instructions (Addendum)
It was great seeing you today! We have addressed the following issues today  1. Weight gain: you gained about 8 pounds over the last one year. It is true Lyrica can cause weight gain. Since you don't see a lot of benefit from Lyrica, we can stop Lyrica today. Continue working on [employer] and exercise.    If we did any lab work today, and the results require attention, either me or my nurse will get in touch with you. If everything is normal, you will get a letter in mail. If you don't hear from [place] in two weeks, please give [place] a call. Otherwise, I look forward to talking with you again at our next visit. If you have any questions or concerns before then, please call the clinic at [phone].  Please bring all your medications to every doctors visit   Sign up for My Chart to have easy access to your labs results, and communication with your Primary care physician.    Please check-out at the front desk before leaving the clinic.   Take Care,   Exercise Guide:

## 2015-10-16 NOTE — Assessment & Plan Note (Signed)
Concerned about weight gain since she started Lyrica. She reports little benefits from this medication. She has already cut down to once daily from twice daily. She said her pain is bearable. She doesn't think she should continue taking the Lyrica. So we have stopped Lyrica today. Lyrica is known to cause weight gain and edema in about 16% cases.

## 2015-11-10 DIAGNOSIS — F319 Bipolar disorder, unspecified: Secondary | ICD-10-CM | POA: Diagnosis not present

## 2015-12-14 ENCOUNTER — Ambulatory Visit: Payer: Medicare PPO | Admitting: Student

## 2015-12-28 ENCOUNTER — Other Ambulatory Visit: Payer: Self-pay | Admitting: Student

## 2015-12-28 ENCOUNTER — Ambulatory Visit (INDEPENDENT_AMBULATORY_CARE_PROVIDER_SITE_OTHER): Payer: Medicare PPO | Admitting: Student

## 2015-12-28 VITALS — BP 126/68 | Temp 99.1°F | Wt 192.6 lb

## 2015-12-28 DIAGNOSIS — F411 Generalized anxiety disorder: Secondary | ICD-10-CM

## 2015-12-28 DIAGNOSIS — R748 Abnormal levels of other serum enzymes: Secondary | ICD-10-CM | POA: Diagnosis not present

## 2015-12-28 DIAGNOSIS — R739 Hyperglycemia, unspecified: Secondary | ICD-10-CM | POA: Diagnosis not present

## 2015-12-28 DIAGNOSIS — G629 Polyneuropathy, unspecified: Secondary | ICD-10-CM | POA: Diagnosis not present

## 2015-12-28 DIAGNOSIS — Z23 Encounter for immunization: Secondary | ICD-10-CM | POA: Diagnosis not present

## 2015-12-28 DIAGNOSIS — G5793 Unspecified mononeuropathy of bilateral lower limbs: Secondary | ICD-10-CM

## 2015-12-28 LAB — COMPLETE METABOLIC PANEL WITH GFR
ALT: 45 U/L — AB (ref 6–29)
AST: 28 U/L (ref 10–35)
Albumin: 4.6 g/dL (ref 3.6–5.1)
Alkaline Phosphatase: 68 U/L (ref 33–130)
BILIRUBIN TOTAL: 0.4 mg/dL (ref 0.2–1.2)
BUN: 12 mg/dL (ref 7–25)
CO2: 28 mmol/L (ref 20–31)
CREATININE: 0.61 mg/dL (ref 0.50–1.05)
Calcium: 10.9 mg/dL — ABNORMAL HIGH (ref 8.6–10.4)
Chloride: 100 mmol/L (ref 98–110)
GFR, Est Non African American: >89 mL/min (ref >=60)
Glucose, Bld: 101 mg/dL — ABNORMAL HIGH (ref 65–99)
Potassium: 4.1 mmol/L (ref 3.5–5.3)
Sodium: 138 mmol/L (ref 135–146)
TOTAL PROTEIN: 7.4 g/dL (ref 6.1–8.1)

## 2015-12-28 LAB — POCT GLYCOSYLATED HEMOGLOBIN (HGB A1C): Hemoglobin A1C: 6

## 2015-12-28 MED ORDER — SERTRALINE HCL 100 MG PO TABS: 50.0000 mg | ORAL_TABLET | Freq: Every day | ORAL | 0 refills | Status: DC

## 2015-12-28 MED ORDER — DULOXETINE HCL 30 MG PO CPEP: 30.0000 mg | ORAL_CAPSULE | Freq: Every day | ORAL | 0 refills | Status: DC

## 2015-12-28 NOTE — Assessment & Plan Note (Signed)
No improvement with gabapentin in the past. She decided to stop taking Lyrica out of concern about the side effects of this medication on her liver. She has mild fatty liver disease. GI was not impressed with it. Started Cymbalta today with a plan to increase to 60 mg daily in 2 weeks

## 2015-12-28 NOTE — Assessment & Plan Note (Signed)
Continues to be anxious on Zoloft. Scored 14 on GAD 7 today. Switch Zoloft to Cymbalta. This should also help with neuropathic pain. Advised patient to cut down on Zoloft to 50 mg for one week and stop. We will start Cymbalta 30 mg for 2 weeks and then increase to 60 mg daily. Patient to return for follow-up in 2 weeks.

## 2015-12-28 NOTE — Assessment & Plan Note (Signed)
Heart elevated transaminases about 5 months ago. Had fatty Infiltration of liver on ultrasound at that time. She was seen by gastroenterologist who wasn't impressed by the finding.  -Repeat CMP

## 2015-12-28 NOTE — Progress Notes (Signed)
   Subjective:    Patient ID: Claudia Gregory is a 55 y.o. old female.  HPI #Anxiety: This is a chronic issue. Feels late even when she is on time for work. As a result she feels fatigued. Fatigue has been going on for 2 months.  Reports losing motivation to go to gym. Goes to sleep every day early and still continues to feel fatigued. Sometimes feels shortness of breath as well. She went to [facility] and was given Klonopin which has helped with anxiety. However, they did not want to continue prescribing Klonopin to her. She is on Zoloft 100 mg for about 4 years. She is also on risperidone 0.5 mg tablet daily for bipolar disorder.   Patient also reports bilateral leg pain. She was on Lyrica in the past and decided to stop taking it out of concern about the side effect of this medication on her liver. She tried gabapentin without benefit.   PMH: reviewed SH: denies smoking.  Review of Systems Per HPI Objective:   Vitals:   12/28/15 0914  BP: 126/68  Temp: 99.1 F (37.3 C)  TempSrc: Oral  Weight: 192 lb 9.6 oz (87.4 kg)    GEN: appears well, no apparent distress. CVS: RRR, normal s1 and s2, no murmurs, no edema RESP: no increased work of breathing NEURO: alert and oriented appropriately, no gross defecits  PSYCH: communicate by sign language. Appropriately dressed. Maintains good eye contact and is cooperative and attentive. Affect appears good. Thought process is logical and goal directed. No suicidal or homicidal ideation. Does not appear to be responding to any internal stimuli. Able to maintain train of thought and concentrate on the questions. Cognitive ability appropriate.    Assessment & Plan:  Generalized anxiety disorder Continues to be anxious on Zoloft. Scored 14 on GAD 7 today. Switch Zoloft to Cymbalta. This should also help with neuropathic pain. Advised patient to cut down on Zoloft to 50 mg for one week and stop. We will start Cymbalta 30 mg for 2 weeks and  then increase to 60 mg daily. Patient to return for follow-up in 2 weeks.  Neuropathic pain of both feet No improvement with gabapentin in the past. She decided to stop taking Lyrica out of concern about the side effects of this medication on her liver. She has mild fatty liver disease. GI was not impressed with it. Started Cymbalta today with a plan to increase to 60 mg daily in 2 weeks  FATIGUE This is likely due to a mood issue. Had normal TSH recently. We will manage mood issue as above

## 2015-12-28 NOTE — Patient Instructions (Signed)
It was great seeing you today! We have addressed the following issues today  1. Anxiety/fatigue/neuropathic pain: I have started a new medication called Cymbalta at 30 mg daily for 2 weeks. After 2 weeks, I will like you to go up to 60 mg daily on Cymbalta. I also like you to cut down on Zoloft to 50 mg for one week and then stop. I also recommend follow-up in 2 weeks in our clinic.     If we did any lab work today, and the results require attention, either me or my nurse will get in touch with you. If everything is normal, you will get a letter in mail. If you don't hear from [place] in two weeks, please give [place] a call. Otherwise, I look forward to talking with you again at our next visit. If you have any questions or concerns before then, please call the clinic at [phone].  Please bring all your medications to every doctors visit   Sign up for My Chart to have easy access to your labs results, and communication with your Primary care physician.    Please check-out at the front desk before leaving the clinic.   Take Care,

## 2015-12-28 NOTE — Assessment & Plan Note (Signed)
This is likely due to a mood issue. Had normal TSH recently. We will manage mood issue as above

## 2015-12-28 NOTE — Assessment & Plan Note (Signed)
A1c today.  

## 2015-12-29 ENCOUNTER — Encounter: Payer: Self-pay | Admitting: Student

## 2015-12-29 NOTE — Progress Notes (Signed)
Sent results from recent visit. Liver enzymes improved. She has mild hypercalcemia likely due to HCTZ. Advised to drink plenty of fluids and increase physical activity. A1c 6.0. Recommended lifestyle changes including physical activity and dietary changes.

## 2016-01-12 ENCOUNTER — Ambulatory Visit: Payer: Medicare PPO | Admitting: Student

## 2016-01-18 ENCOUNTER — Ambulatory Visit: Payer: Medicare PPO | Admitting: Student

## 2016-01-18 NOTE — Progress Notes (Deleted)
It was great seeing you today! We have addressed the following issues today  1.    If we did any lab work today, and the results require attention, either me or my nurse will get in touch with you. If everything is normal, you will get a letter in mail. If you don't hear from [place] in two weeks, please give [place] a call. Otherwise, we look forward to seeing you again at your next visit. If you have any questions or concerns before then, please call the clinic at [phone].   Please bring all your medications to every doctors visit   Sign up for My Chart to have easy access to your labs results, and communication with your Primary care physician.     Please check-out at the front desk before leaving the clinic.    Take Care,

## 2016-03-04 ENCOUNTER — Encounter: Payer: Self-pay | Admitting: Family Medicine

## 2016-03-04 ENCOUNTER — Ambulatory Visit (INDEPENDENT_AMBULATORY_CARE_PROVIDER_SITE_OTHER): Payer: Medicare PPO | Admitting: Family Medicine

## 2016-03-04 VITALS — BP 138/76 | HR 76 | Temp 97.8°F | Wt 192.0 lb

## 2016-03-04 DIAGNOSIS — J069 Acute upper respiratory infection, unspecified: Secondary | ICD-10-CM | POA: Diagnosis not present

## 2016-03-04 DIAGNOSIS — R040 Epistaxis: Secondary | ICD-10-CM | POA: Insufficient documentation

## 2016-03-04 MED ORDER — FLUTICASONE PROPIONATE 50 MCG/ACT NA SUSP: 2.0000 | Freq: Every day | NASAL | 6 refills | Status: DC

## 2016-03-04 NOTE — Assessment & Plan Note (Signed)
Associated with URI symptoms and cough. This is likely from the dry air and also recent URI which patient has been blowing her nose often. No red flag symptoms. -Instructed patient to apply Vaseline to the inside of her nares  -Flonase -Return precautions discussed

## 2016-03-04 NOTE — Patient Instructions (Signed)
Thank you for coming in today, it was so nice to see you! Today we talked about:    Nosebleeds and cough. Your nose bleeds are likely from dry air and blowing your nose. Please apply Vaseline to the inside of her nose with a Q-tip once daily. I have given a prescription for Flonase please use as prescribed.  Please follow up in 1 week if your symptoms do not improve.   Bring in all your medications or supplements to each appointment for review.   If you have any questions or concerns, please do not hesitate to call the office at [phone]. You can also message me directly via MyChart.   Sincerely,  Smitty Cords, MD   Nosebleed Nosebleeds are common. A nosebleed can be caused by many things, including:  Getting hit hard in the nose.  Infections.  Dryness in your nose.  A dry climate.  Medicines.  Picking your nose.  Your home heating and cooling systems. HOME CARE   Try controlling your nosebleed by pinching your nostrils gently. Do this for at least 10 minutes.  Avoid blowing or sniffing your nose for a number of hours after having a nosebleed.  Do not put gauze inside of your nose yourself. If your nose was packed by your doctor, try to keep the pack inside of your nose until your doctor removes it.  If a gauze pack was used and it starts to fall out, gently replace it or cut off the end of it.  If a balloon catheter was used to pack your nose, do not cut or remove it unless told by your doctor.  Avoid lying down while you are having a nosebleed. Sit up and lean forward.  Use a nasal spray decongestant to help with a nosebleed as told by your doctor.  Do not use petroleum jelly or mineral oil in your nose. These can drip into your lungs.  Keep your house humid by using:  Less air conditioning.  A humidifier.  Aspirin and blood thinners make bleeding more likely. If you are prescribed these medicines and you have nosebleeds, ask your doctor if you  should stop taking the medicines or adjust the dose. Do not stop medicines unless told by your doctor.  Resume your normal activities as you are able. Avoid straining, lifting, or bending at your waist for several days.  If your nosebleed was caused by dryness in your nose, use over-the-counter saline nasal spray or gel. If you must use a lubricant:  Choose one that is water-soluble.  Use it only as needed.  Do not use it within several hours of lying down.  Keep all follow-up visits as told by your doctor. This is important. GET HELP IF:  You have a fever.  You get frequent nosebleeds.  You are getting nosebleeds more often. GET HELP RIGHT AWAY IF:  Your nosebleed lasts longer than 20 minutes.  Your nosebleed occurs after an injury to your face, and your nose looks crooked or broken.  You have unusual bleeding from other parts of your body.  You have unusual bruising on other parts of your body.  You feel light-headed or dizzy.  You become sweaty.  You throw up (vomit) blood.  You have a nosebleed after a head injury. This information is not intended to replace advice given to you by your health care provider. Make sure you discuss any questions you have with your health care provider. Document Released: 12/29/2007 Document Revised: 04/11/2014 Document  Reviewed: 11/04/2013 Elsevier Interactive Patient Education  2017 [employer].

## 2016-03-04 NOTE — Progress Notes (Signed)
Subjective:    Patient ID: Claudia Gregory , female   DOB: 1961-02-02 , 55 y.o..   MRN: [id number]  HPI  *Patient seen with sign language interpreter*  Claudia Gregory is here for  Chief Complaint  Patient presents with  . Cough   Cough and nosebleeds  Onset:  5 days ago Course: Patient states that her cough started about 5 take days ago after she developed a runny nose. Her nose is also been bleeding intermittently. Severity: Mild  Nothing makes it better or worse. Patient denies picking her nose. Admits to some headaches. Denies any high blood pressure but this is on her problem list. She dose take Lisinopril.   Symptoms Sputum:no  Fever: no  Shortness of breath:no  Leg Swelling:no  Heart Burn or Reflux:no  Wheezing:no  Post Nasal Drip: yes   Red Flags Weight Loss:  no Immunocompromised:  no  PMH Asthma or COPD: yes  PMH of Smoking: no    Review of Systems: Per HPI. All other systems reviewed and are negative.  Past Medical History: Patient Active Problem List   Diagnosis Date Noted  . Bleeding from the nose 03/04/2016  . Generalized anxiety disorder 12/28/2015  . Elevated liver enzymes 12/28/2015  . Hyperglycemia 12/28/2015  . Medication side effects 10/16/2015  . Plantar fasciitis of right foot 04/01/2015  . Neuropathic pain of both feet ([place]) 10/27/2014  . Hx of adenomatous polyp of colon 08/08/2014  . Deafness 08/04/2014  . GERD (gastroesophageal reflux disease) 05/13/2014  . Muscle spasm 05/13/2014  . Back pain 09/05/2013  . Hyperlipidemia 02/05/2013  . Hot flashes 11/24/2012  . Menstrual periods irregular 11/16/2012  . OBESITY 10/23/2008  . Hypertension 10/23/2008  . BIPOLAR DISORDER UNSPECIFIED 09/02/2008  . FATIGUE 09/02/2008    Social Hx:  reports that she has never smoked. She has never used smokeless tobacco.   Objective:   BP 138/76   Pulse 76   Temp 97.8 F (36.6 C) (Oral)   Wt 192 lb (87.1 kg)   SpO2 97%   BMI 30.99 kg/m    Physical Exam  Constitutional: She appears well-developed and well-nourished.  HENT:  Head: Normocephalic and atraumatic.  Right Ear: External ear normal.  Left Ear: External ear normal.  Nose: Mucosal edema (slight mucosal edema with dried blood) present. No rhinorrhea, nasal deformity or septal deviation. Epistaxis is observed. Right sinus exhibits no maxillary sinus tenderness and no frontal sinus tenderness. Left sinus exhibits no maxillary sinus tenderness and no frontal sinus tenderness.  Mouth/Throat: Oropharynx is clear and moist.  Eyes: Conjunctivae and EOM are normal. Pupils are equal, round, and reactive to light.  Neck: Normal range of motion. Neck supple.  Cardiovascular: Normal rate, regular rhythm, normal heart sounds and intact distal pulses.   No murmur heard. Pulmonary/Chest: Effort normal and breath sounds normal. No respiratory distress. She has no wheezes.  Abdominal: Soft. Bowel sounds are normal.  Neurological: She is alert.  Skin: Skin is warm and dry. Capillary refill takes less than 2 seconds.  Psychiatric: She has a normal mood and affect.    Assessment & Plan:  Bleeding from the nose Associated with URI symptoms and cough. This is likely from the dry air and also recent URI which patient has been blowing her nose often. No red flag symptoms. -Instructed patient to apply Vaseline to the inside of her nares  -Flonase -Return precautions discussed  URI - Symptomatic treatment including rest, plenty of fluids, and tylenol PRN - Return precautions  discussed  Smitty Cords, MD [place], PGY-2

## 2016-04-17 NOTE — Progress Notes (Deleted)
   CC: ***  HPI: Claudia Gregory is a 56 y.o. female with PMH including hypertension, GERD, deafness, bipolar disorder, HLD,  who presents to [facility] today with *** of *** duration.   - hep c screen, PAP smear, an dmammogram due   Review of Symptoms:  See HPI for ROS.   CC, SH/smoking status, and VS noted.  Objective: There were no vitals taken for this visit. GEN: NAD, alert, cooperative, and pleasant.*** EYE: no conjunctival injection, pupils equally round and reactive to light ENMT: normal tympanic light reflex, no nasal polyps,no rhinorrhea, no pharyngeal erythema or exudates NECK: full ROM, no thyromegally RESPIRATORY: clear to auscultation bilaterally with no wheezes, rhonchi or rales, good effort CV: RRR, no m/r/g, no peripheral edema GI: soft, non-tender, non-distended, normoactive bowel sounds, no hepatosplenomegaly SKIN: warm and dry, no rashes or lesions NEURO: II-XII grossly intact, normal gait, peripheral sensation intact PSYCH: AAOx3, appropriate affect  Flu Vaccine: {YES/NO/WILD [id number]  Tdap Vaccine: {YES/NO/WILD [id number]  - every 71yrs - (<3 lifetime doses or unknown): all wounds -- look up need for Tetanus IG - (>=3 lifetime doses): clean/minor wound if >88yrs from previous; all other wounds if >83yrs from previous Zoster Vaccine: {YES/NO/WILD [id number]} (those >50yo, once) Pneumonia Vaccine: {YES/NO/WILD [id number] (those w/ risk factors) - (<1yr) Both: Immunocompromised, cochlear implant, CSF leak, asplenic, sickle cell, Chronic Renal Failure - (<37yr) PPSV-23 only: Heart dz, lung disease, DM, tobacco abuse, alcoholism, cirrhosis/liver disease. - (>40yr): PPSV13 then PPSV23 in 6-12mths;  - (>3yr): repeat PPSV23 once if pt received prior to 54yo and 35yrs have passed  Assessment and plan:  No problem-specific Assessment & Plan notes found for this encounter.   No orders of the defined types were placed in this encounter.   No orders of the  defined types were placed in this encounter.    Everrett Coombe, MD,MS,  PGY1 04/17/2016 9:17 PM

## 2016-04-18 ENCOUNTER — Ambulatory Visit: Payer: Medicare PPO | Admitting: Student in an Organized Health Care Education/Training Program

## 2016-05-27 ENCOUNTER — Encounter: Payer: Self-pay | Admitting: Student

## 2016-05-27 ENCOUNTER — Ambulatory Visit (INDEPENDENT_AMBULATORY_CARE_PROVIDER_SITE_OTHER): Payer: Medicare PPO | Admitting: Student

## 2016-05-27 ENCOUNTER — Telehealth: Payer: Self-pay | Admitting: *Deleted

## 2016-05-27 VITALS — BP 131/76 | HR 79 | Temp 98.4°F | Ht 66.0 in | Wt 196.0 lb

## 2016-05-27 DIAGNOSIS — Z1159 Encounter for screening for other viral diseases: Secondary | ICD-10-CM

## 2016-05-27 DIAGNOSIS — E785 Hyperlipidemia, unspecified: Secondary | ICD-10-CM

## 2016-05-27 DIAGNOSIS — F411 Generalized anxiety disorder: Secondary | ICD-10-CM

## 2016-05-27 DIAGNOSIS — I10 Essential (primary) hypertension: Secondary | ICD-10-CM | POA: Diagnosis not present

## 2016-05-27 DIAGNOSIS — Z0000 Encounter for general adult medical examination without abnormal findings: Secondary | ICD-10-CM | POA: Diagnosis not present

## 2016-05-27 DIAGNOSIS — R739 Hyperglycemia, unspecified: Secondary | ICD-10-CM | POA: Diagnosis not present

## 2016-05-27 DIAGNOSIS — G629 Polyneuropathy, unspecified: Secondary | ICD-10-CM

## 2016-05-27 DIAGNOSIS — G5793 Unspecified mononeuropathy of bilateral lower limbs: Secondary | ICD-10-CM

## 2016-05-27 LAB — POCT GLYCOSYLATED HEMOGLOBIN (HGB A1C): HEMOGLOBIN A1C: 6.4

## 2016-05-27 LAB — LIPID PANEL
CHOLESTEROL: 254 mg/dL — AB (ref <200)
HDL: 57 mg/dL (ref >50)
LDL Cholesterol: 141 mg/dL — ABNORMAL HIGH (ref <100)
Total CHOL/HDL Ratio: 4.5 Ratio (ref <5.0)
Triglycerides: 282 mg/dL — ABNORMAL HIGH (ref <150)
VLDL: 56 mg/dL — ABNORMAL HIGH (ref <30)

## 2016-05-27 MED ORDER — DULOXETINE HCL 60 MG PO CPEP: 60.0000 mg | ORAL_CAPSULE | Freq: Every day | ORAL | 3 refills | Status: DC

## 2016-05-27 NOTE — Assessment & Plan Note (Addendum)
Blood pressure within normal limits. We will continue lisinopril/hydrochlorothiazide She had BMP about 4 months ago.  Will assess blood pressure again when she returns for Pap smear.

## 2016-05-27 NOTE — Assessment & Plan Note (Signed)
A1c is 6.4 today. Discussed about lifestyle changes including diet and exercise. Gave handout as well. Foot exam was in normal limits.

## 2016-05-27 NOTE — Patient Instructions (Signed)
It was great seeing you today! We have addressed the following issues today 1. Foot pain: this is likely due to neuropathic pain. I have increased his Cymbalta to 60 mg daily. Come back and see [place] if no improvement with this over the next 2-3 weeks 2.   Prediabetes: A1c 6.4. This in the upper limit of prediabetes. Technically you are not diabetic yet. I strongly recommend lifestyle change with diet and exercise. Please see below for more on this.  3.   Screening for cervical cancer: You are due for cervical cancer screening. Please come back and see [place] for this.   If we did any lab work today, and the results require attention, either me or my nurse will get in touch with you. If everything is normal, you will get a letter in mail and a message via . If you don't hear from [place] in two weeks, please give [place] a call. Otherwise, we look forward to seeing you again at your next visit. If you have any questions or concerns before then, please call the clinic at [phone].  Please bring all your medications to every doctors visit  Sign up for My Chart to have easy access to your labs results, and communication with your Primary care physician.    Please check-out at the front desk before leaving the clinic.    Take Care,   Dr. Cyndia Skeeters  Portion Size    Choose healthier foods such as 100% whole grains, vegetables, fruits, beans, nut seeds, olive oil, most vegetable oils, fat-free dietary, wild game and fish.   Avoid sweet tea, other sweetened beverages, soda, fruit juice, cold cereal and milk and trans fat.   Eat at least 3 meals and 1-2 snacks per day.  Aim for no more than 5 hours between eating.  Eat breakfast within one hour of getting up.    Exercise at least 150 minutes per week, including weight resistance exercises 3 or 4 times per week.   Try to lose at least 7-10% of your current body weight.    Neuropathic Pain Introduction Neuropathic pain is pain caused by damage to the  nerves that are responsible for certain sensations in your body (sensory nerves). The pain can be caused by damage to:  The sensory nerves that send signals to your spinal cord and brain (peripheral nervous system).  The sensory nerves in your brain or spinal cord (central nervous system). Neuropathic pain can make you more sensitive to pain. What would be a minor sensation for most people may feel very painful if you have neuropathic pain. This is usually a long-term condition that can be difficult to treat. The type of pain can differ from person to person. It may start suddenly (acute), or it may develop slowly and last for a long time (chronic). Neuropathic pain may come and go as damaged nerves heal or may stay at the same level for years. It often causes emotional distress, loss of sleep, and a lower quality of life. What are the causes? The most common cause of damage to a sensory nerve is diabetes. Many other diseases and conditions can also cause neuropathic pain. Causes of neuropathic pain can be classified as:  Toxic. Many drugs and chemicals can cause toxic damage. The most common cause of toxic neuropathic pain is damage from drug treatment for cancer (chemotherapy).  Metabolic. This type of pain can happen when a disease causes imbalances that damage nerves. Diabetes is the most common of these  diseases. Vitamin B deficiency caused by long-term alcohol abuse is another common cause.  Traumatic. Any injury that cuts, crushes, or stretches a nerve can cause damage and pain. A common example is feeling pain after losing an arm or leg (phantom limb pain).  Compression-related. If a sensory nerve gets trapped or compressed for a long period of time, the blood supply to the nerve can be cut off.  Vascular. Many blood vessel diseases can cause neuropathic pain by decreasing blood supply and oxygen to nerves.  Autoimmune. This type of pain results from diseases in which the body's defense  system mistakenly attacks sensory nerves. Examples of autoimmune diseases that can cause neuropathic pain include lupus and multiple sclerosis.  Infectious. Many types of viral infections can damage sensory nerves and cause pain. Shingles infection is a common cause of this type of pain.  Inherited. Neuropathic pain can be a symptom of many diseases that are passed down through families (genetic). What are the signs or symptoms? The main symptom is pain. Neuropathic pain is often described as:  Burning.  Shock-like.  Stinging.  Hot or cold.  Itching. How is this diagnosed? No single test can diagnose neuropathic pain. Your health care provider will do a physical exam and ask you about your pain. You may use a pain scale to describe how bad your pain is. You may also have tests to see if you have a high sensitivity to pain and to help find the cause and location of any sensory nerve damage. These tests may include:  Imaging studies, such as:  X-rays.  CT scan.  MRI.  Nerve conduction studies to test how well nerve signals travel through your sensory nerves (electrodiagnostic testing).  Stimulating your sensory nerves through electrodes on your skin and measuring the response in your spinal cord and brain (somatosensory evoked potentials). How is this treated? Treatment for neuropathic pain may change over time. You may need to try different treatment options or a combination of treatments. Some options include:  Over-the-counter pain relievers.  Prescription medicines. Some medicines used to treat other conditions may also help neuropathic pain. These include medicines to:  Control seizures (anticonvulsants).  Relieve depression (antidepressants).  Prescription-strength pain relievers (narcotics). These are usually used when other pain relievers do not help.  Transcutaneous nerve stimulation (TENS). This uses electrical currents to block painful nerve signals. The treatment  is painless.  Topical and local anesthetics. These are medicines that numb the nerves. They can be injected as a nerve block or applied to the skin.  Alternative treatments, such as:  Acupuncture.  Meditation.  Massage.  Physical therapy.  Pain management programs.  Counseling. Follow these instructions at home:  Learn as much as you can about your condition.  Take medicines only as directed by your health care provider.  Work closely with all your health care providers to find what works best for you.  Have a good support system at home.  Consider joining a chronic pain support group. Contact a health care provider if:  Your pain treatments are not helping.  You are having side effects from your medicines.  You are struggling with fatigue, mood changes, depression, or anxiety. This information is not intended to replace advice given to you by your health care provider. Make sure you discuss any questions you have with your health care provider. Document Released: 12/17/2003 Document Revised: 10/09/2015 Document Reviewed: 08/29/2013  2017 Elsevier

## 2016-05-27 NOTE — Assessment & Plan Note (Signed)
Discussed about her weight and BMI. Discussed about lifestyle changes including exercise and diet as well. Gave handout. Hepatitis C screening today. Patient to return for Pap smear. Encouraged her to see her dentist.

## 2016-05-27 NOTE — Assessment & Plan Note (Signed)
Lipid panel today

## 2016-05-27 NOTE — Telephone Encounter (Signed)
Patient called using interpreter line stating that she told Dr. Cyndia Skeeters while she was in clinic that she was taking 30mg  of cymbalta.  She checked the bottle when she got home and realized that she is actually already on 60mg  of cymbalta and will need to be increased from this dose.  Will forward to MD and also made his CMA aware of the situation. Leiam Hopwood,CMA

## 2016-05-27 NOTE — Progress Notes (Signed)
Subjective:    Glory is a 56 y.o. old female here for feet pain In person sign interpreter by the name Sherry Ruffing was used for this encounter. HPI Feet pain: burning over the bottom of her feet. Right > left.  Mostly happens in the evening. This has been going on for two to three months. However, she has history of neuropathy in the past. No pain with first step in the morning. Pain comes and goes. No alleviating or aggravating factor. Epson salt helped a little. Denies fever, numbness, weakness, pain with walking or prior injury.  Patient has history of neuropathic pain in the past. She was taken off Lyrica out of concern for fatty liver. She was started on Cymbalta 30 mg daily when she was taken off Lyrica.  PMH/Problem List: has OBESITY; BIPOLAR DISORDER UNSPECIFIED; FATIGUE; Hypertension; Menstrual periods irregular; Hot flashes; Hyperlipidemia; Back pain; GERD (gastroesophageal reflux disease); Muscle spasm; Deafness; Hx of adenomatous polyp of colon; Neuropathic pain of both feet ([place]); Plantar fasciitis of right foot; Generalized anxiety disorder; Elevated liver enzymes; Hyperglycemia; Bleeding from the nose; and Routine adult health maintenance on her problem list.   has a past medical history of Anxiety; Bipolar 1 disorder ([place]); Carpal tunnel syndrome of left wrist; Chest pain, atypical; Deaf; Depression; FATIGUE; Gallstones; GERD (gastroesophageal reflux disease); adenomatous polyp of colon (08/08/2014); Hypertension; OBESITY; and Pneumonia.  FH:  Family History  Problem Relation Age of Onset  . Colon cancer Neg Hx   . Rectal cancer Neg Hx   . Stomach cancer Neg Hx   . Brain cancer Mother   . Hypertension Mother   . Diabetes Mother   . Diabetes Maternal Aunt   . Heart attack Maternal Grandmother   . Hypertension Maternal Grandmother     [place] Social History  Substance Use Topics  . Smoking status: Never Smoker  . Smokeless tobacco: Never Used  . Alcohol use 1.2 oz/week    2  Glasses of wine per week     Comment: rare glass of wine     Review of Systems Review of systems negative except for pertinent positives and negatives in history of present illness above.     Objective:     Vitals:   05/27/16 1412  BP: 131/76  Pulse: 79  Temp: 98.4 F (36.9 C)  TempSrc: Oral  SpO2: 95%  Weight: 196 lb (88.9 kg)  Height: 5\' 6"  (1.676 m)    Physical Exam GEN: appears well, no apparent distress. Head: normocephalic and atraumatic  Eyes: conjunctiva without injection, sclera anicteric Ears: external ear and ear canal normal Nares: no rhinorrhea, congestion or erythema Oropharynx: mmm without erythema or exudation HEM: negative for cervical or periauricular lymphadenopathies CVS: RRR, nl S1&S2, no murmurs, no edema,  2+ DP & PT pulses bilaterally, cap refills < 2 secs RESP: speaks in full sentence, no IWOB, good air movement bilaterally, CTAB GI: BS present & normal, soft, NTND, no guarding, no rebound, no mass MSK: no focal tenderness or notable swelling SKIN: no apparent skin lesion ENDO: negative thyromegally NEURO: alert and oiented appropriately, Motor 5/5, light sensation intact in all dermatomes, patellar and achille reflex 1+ bilaterally in lower extremities. A 9 point monofilament exam within normal limits. PSYCH: euthymic mood with congruent affect    Assessment and Plan:  Neuropathic pain of both feet Neurovascular exam within normal limits. A 9 point monofilament exam within normal limits as well. Increased Cymbalta to 60 mg daily. This could help with her mood disorder  as well. We will check blood pressure when she returns for Pap smear one month.  Hypertension Blood pressure within normal limits. We will continue lisinopril/hydrochlorothiazide She had BMP about 4 months ago.  Will assess blood pressure again when she returns for Pap smear.  Hyperglycemia A1c is 6.4 today. Discussed about lifestyle changes including diet and exercise. Gave  handout as well. Foot exam was in normal limits.  Hyperlipidemia Lipid panel today.  Routine adult health maintenance Discussed about her weight and BMI. Discussed about lifestyle changes including exercise and diet as well. Gave handout. Hepatitis C screening today. Patient to return for Pap smear. Encouraged her to see her dentist.  Orders Placed This Encounter  Procedures  . Lipid panel  . Hepatitis C antibody  . HgB A1c    Return in about 1 month (around 06/24/2016) for pap smear. and blood pressure check  Mercy Riding, MD 05/27/16 Pager: [phone]

## 2016-05-27 NOTE — Telephone Encounter (Signed)
I don't know how to talk to her over the phone. I don't recommend taking more than 60 mg a day if she is already taking this. I will try low dose gabapentin if that is okay with her. Thanks! Bretta Bang

## 2016-05-27 NOTE — Assessment & Plan Note (Addendum)
Neurovascular exam within normal limits. A 9 point monofilament exam within normal limits as well. Increased Cymbalta to 60 mg daily. This could help with her mood disorder as well. We will check blood pressure when she returns for Pap smear one month.

## 2016-05-28 LAB — HEPATITIS C ANTIBODY: HCV AB: NEGATIVE

## 2016-05-30 ENCOUNTER — Encounter: Payer: Self-pay | Admitting: Student

## 2016-05-30 ENCOUNTER — Other Ambulatory Visit: Payer: Self-pay | Admitting: Student

## 2016-05-30 NOTE — Progress Notes (Signed)
Attempted to call patient twice but she didn't pick up her phone. I sent result letter from her recent visit. Already discussed about her A1c at her last visit. Her cholesterol is high but not to the point we need to start medication. Already discussed about life style changes at her last visit.

## 2016-05-30 NOTE — Telephone Encounter (Signed)
Called and talked to patient about 1. Labs result from her recent visit: ASCVD risk score 2.9%. Recommended life style change as we have discussed at last visit. 2. Neuropathic pain: she would like to continue Cymbalta 60 mg. Likes to hold of adding new medicine. She will call the office to let us know if the pain is not well controlled with Cymbalta 60 mg.

## 2016-06-13 ENCOUNTER — Telehealth: Payer: Self-pay | Admitting: Student

## 2016-06-13 NOTE — Telephone Encounter (Signed)
No! She is not blind. She is deaf. I am not going to write this.  Thanks! Claudia Gregory

## 2016-06-13 NOTE — Telephone Encounter (Signed)
Pt is calling and would like the doctor to write a letter stating that she is permanently blind. She needs this so that she can get a pass to the [facility] for free. Patient would like to come and pick this up Wednesday or Thursday if possible. If you have any question please her at [phone]. Blima Rich

## 2016-06-14 NOTE — Telephone Encounter (Signed)
Called and spoke to pt. She needs a letter for the [employer]. The letter needs to state she perm deaf (not blind).  Please advise. Ottis Stain, CMA

## 2016-06-16 ENCOUNTER — Encounter: Payer: Self-pay | Admitting: Student

## 2016-06-17 ENCOUNTER — Encounter: Payer: Self-pay | Admitting: Student

## 2016-06-17 NOTE — Telephone Encounter (Signed)
Wrote and gave letter to husband.

## 2016-07-01 ENCOUNTER — Other Ambulatory Visit: Payer: Self-pay | Admitting: Student

## 2016-07-01 DIAGNOSIS — I10 Essential (primary) hypertension: Secondary | ICD-10-CM

## 2016-07-04 DIAGNOSIS — Z79899 Other long term (current) drug therapy: Secondary | ICD-10-CM | POA: Diagnosis not present

## 2016-07-19 DIAGNOSIS — F3181 Bipolar II disorder: Secondary | ICD-10-CM | POA: Diagnosis not present

## 2016-08-17 DIAGNOSIS — F99 Mental disorder, not otherwise specified: Secondary | ICD-10-CM | POA: Diagnosis not present

## 2016-09-01 DIAGNOSIS — F99 Mental disorder, not otherwise specified: Secondary | ICD-10-CM | POA: Diagnosis not present

## 2016-09-07 ENCOUNTER — Ambulatory Visit: Payer: Medicare PPO | Admitting: Family Medicine

## 2016-09-20 ENCOUNTER — Telehealth: Payer: Self-pay | Admitting: Student

## 2016-09-20 DIAGNOSIS — F3181 Bipolar II disorder: Secondary | ICD-10-CM | POA: Diagnosis not present

## 2016-09-20 NOTE — Telephone Encounter (Signed)
Patient is aware that letter is complete and ready for pickup.  Derl Barrow, RN

## 2016-09-20 NOTE — Telephone Encounter (Signed)
Information regarding what is needed in letter and an example was placed in Dr. Juliann Pares box. Keshia Weare,CMA

## 2016-09-20 NOTE — Telephone Encounter (Signed)
Pt brought in letter from [place] requesting documentation of her disablilty. Please call patient when the letter is ready for pickup

## 2016-09-20 NOTE — Telephone Encounter (Signed)
Letter is printed and placed in Claudia Gregory's box. Called patient to inform.

## 2016-09-22 DIAGNOSIS — F99 Mental disorder, not otherwise specified: Secondary | ICD-10-CM | POA: Diagnosis not present

## 2016-09-30 ENCOUNTER — Telehealth: Payer: Self-pay

## 2016-09-30 NOTE — Telephone Encounter (Signed)
Pre-visit call completed with  patient. 

## 2016-10-02 NOTE — Progress Notes (Addendum)
[name] at [facility] [address], [place], [place] [address] [phone]  Date:  10/03/2016   Name:  Claudia Gregory   DOB:  1961-02-25   MRN:  [id number]  PCP:  Darreld Mclean, MD    Chief Complaint: Establish Care (Pt here to est care. Would like to disucss diabetes and HTN. )   History of Present Illness:  Claudia Gregory is a 56 y.o. very pleasant female patient who presents with the following:  Here today as a new patient.  [facility] pt of [facility]- last HPI from February:  In person sign interpreter by the name Claudia Gregory was used for this encounter. HPI Feet pain: burning over the bottom of her feet. Right > left.  Mostly happens in the evening. This has been going on for two to three months. However, she has history of neuropathy in the past. No pain with first step in the morning. Pain comes and goes. No alleviating or aggravating factor. Epson salt helped a little. Denies fever, numbness, weakness, pain with walking or prior injury.  Patient has history of neuropathic pain in the past. She was taken off Lyrica out of concern for fatty liver. She was started on Cymbalta 30 mg daily when she was taken off Lyrica.  PMH/Problem List: has OBESITY; BIPOLAR DISORDER UNSPECIFIED; FATIGUE; Hypertension; Menstrual periods irregular; Hot flashes; Hyperlipidemia; Back pain; GERD (gastroesophageal reflux disease); Muscle spasm; Deafness; Hx of adenomatous polyp of colon; Neuropathic pain of both feet ([place]); Plantar fasciitis of right foot; Generalized anxiety disorder; Elevated liver enzymes; Hyperglycemia; Bleeding from the nose; and Routine adult health maintenance on her problem list.   has a past medical history of Anxiety; Bipolar 1 disorder ([place]); Carpal tunnel syndrome of left wrist; Chest pain, atypical; Deaf; Depression; FATIGUE; Gallstones; GERD (gastroesophageal reflux disease); adenomatous polyp of colon (08/08/2014);  Hypertension; OBESITY; and Pneumonia.  She has congenital deafness as her mother having [place] measles during pregnancy.   She had been on lyrica for her neuropathy- however she was taken off this due to weight gain and put on cymbalta instead.   This does seem to be helping with her foot pain and neuropathy.  The exact cause of her neuropathy is not known.   She does see GYN- she Claudia Gregory.  She is going for her pap and mammo next week  She enjoys reading, yard work, wine tastings, shopping, being outdoors Married - her husband is also deaf.  She has 2 sons who are 34 and 50, and 1 grandaughter who is 48 yo.  Her grand-daughter does live nearby which is a great joy.     Lab Results  Component Value Date   HGBA1C 6.4 05/27/2016   DM does run in her family- her mother and aunt both have this.    Her GERD is doing better- she is using apple cider vinegar daily which helps with her sx.    She does have HTN  Asked about CP or SOB- pt does state that recently, while so very hot outside she may feel SOB while doing yardwork.  However she does not have chest pain and thinks this is just from the heat Never had any CAD or other cardiac concerns Never had a stress test   An in-person ASL interpreter is here with the patient today   Patient Active Problem List   Diagnosis Date Noted  . Generalized anxiety disorder 12/28/2015  . Elevated liver  enzymes 12/28/2015  . Hyperglycemia 12/28/2015  . Plantar fasciitis of right foot 04/01/2015  . Neuropathic pain of both feet 10/27/2014  . Hx of adenomatous polyp of colon 08/08/2014  . Deafness 08/04/2014  . GERD (gastroesophageal reflux disease) 05/13/2014  . Muscle spasm 05/13/2014  . Back pain 09/05/2013  . Hyperlipidemia 02/05/2013  . Hot flashes 11/24/2012  . Menstrual periods irregular 11/16/2012  . OBESITY 10/23/2008  . Hypertension 10/23/2008  . BIPOLAR DISORDER UNSPECIFIED 09/02/2008  . FATIGUE 09/02/2008    Past Medical History:   Diagnosis Date  . Anxiety   . Bipolar 1 disorder ([place])   . Carpal tunnel syndrome of left wrist   . Chest pain, atypical   . Deaf   . Depression   . FATIGUE   . Gallstones   . GERD (gastroesophageal reflux disease)   . Hx of adenomatous polyp of colon 08/08/2014  . Hypertension   . OBESITY   . Pneumonia     Past Surgical History:  Procedure Laterality Date  . CHOLECYSTECTOMY  2000  . COLONOSCOPY  2016   5 mm adenoma  . TUBAL LIGATION  1998    Social History  Substance Use Topics  . Smoking status: Never Smoker  . Smokeless tobacco: Never Used  . Alcohol use 1.2 oz/week    2 Glasses of wine per week     Comment: rare glass of wine     Family History  Problem Relation Age of Onset  . Brain cancer Mother   . Hypertension Mother   . Diabetes Mother   . Diabetes Maternal Aunt   . Heart attack Maternal Grandmother   . Hypertension Maternal Grandmother   . Colon cancer Neg Hx   . Rectal cancer Neg Hx   . Stomach cancer Neg Hx     No Known Allergies  Medication list has been reviewed and updated.  Current Outpatient Prescriptions on File Prior to Visit  Medication Sig Dispense Refill  . DULoxetine (CYMBALTA) 60 MG capsule Take 1 capsule (60 mg total) by mouth daily. (Patient not taking: Reported on 09/30/2016) 30 capsule 3  . fluticasone (FLONASE) 50 MCG/ACT nasal spray Place 2 sprays into both nostrils daily. (Patient not taking: Reported on 09/30/2016) 16 g 6  . lisinopril-hydrochlorothiazide (PRINZIDE,ZESTORETIC) 20-25 MG tablet TAKE 1 TABLET BY MOUTH DAILY 90 tablet 0  . risperiDONE (RISPERDAL) 0.5 MG tablet Take 0.5 mg by mouth daily.     No current facility-administered medications on file prior to visit.     Review of Systems:  As per HPI- otherwise negative.  No fever or chills   Physical Examination: Vitals:   10/03/16 1358  BP: 128/82  Pulse: 61  Temp: 97.8 F (36.6 C)   Vitals:   10/03/16 1358  Weight: 182 lb 6.4 oz (82.7 kg)  Height: 5\' 6"   (1.676 m)   Body mass index is 29.44 kg/m. Ideal Body Weight: Weight in (lb) to have BMI = 25: 154.6  GEN: WDWN, NAD, Non-toxic, A & O x 3, looks well, overweight HEENT: Atraumatic, Normocephalic. Neck supple. No masses, No LAD. Ears and Nose: No external deformity. CV: RRR, No M/G/R. No JVD. No thrill. No extra heart sounds. PULM: CTA B, no wheezes, crackles, rhonchi. No retractions. No resp. distress. No accessory muscle use. ABD: S, NT, ND, +BS. No rebound. No HSM. EXTR: No c/c/e NEURO Normal gait.  PSYCH: Normally interactive. Conversant. Not depressed or anxious appearing.  Calm demeanor.   Compared with EKG from 2  years ago- no significant change noted   Assessment and Plan: Fatty liver - Plan: Comprehensive metabolic panel, Gamma GT  Pre-diabetes - Plan: Comprehensive metabolic panel, Hemoglobin A1c  SOB (shortness of breath) - Plan: EKG 12-Lead  Here today to establish care Suspect mild SOB due to heat- pt does not wish to pursue further right now but will let me know if she has persistent or worsening sx  She has history of fatty liver and pre-diabetes Follow-up on these issues today Otherwise plan to visit in 6 months She does use mychart   Signed Lamar Blinks, MD  Received her labs as below Results for orders placed or performed in visit on 10/03/16  Comprehensive metabolic panel  Result Value Ref Range   Sodium 135 135 - 145 mEq/L   Potassium 3.4 (L) 3.5 - 5.1 mEq/L   Chloride 99 96 - 112 mEq/L   CO2 30 19 - 32 mEq/L   Glucose, Bld 94 70 - 99 mg/dL   BUN 14 6 - 23 mg/dL   Creatinine, Ser 0.65 0.40 - 1.20 mg/dL   Total Bilirubin 0.3 0.2 - 1.2 mg/dL   Alkaline Phosphatase 63 39 - 117 U/L   AST 21 0 - 37 U/L   ALT 34 0 - 35 U/L   Total Protein 7.7 6.0 - 8.3 g/dL   Albumin 4.6 3.5 - 5.2 g/dL   Calcium 10.3 8.4 - 10.5 mg/dL   GFR 101.11 >60.00 mL/min  Gamma GT  Result Value Ref Range   GGT 96 (H) 7 - 51 U/L  Hemoglobin A1c  Result Value Ref Range    Hgb A1c MFr Bld 5.9 4.6 - 6.5 %   Message to pt

## 2016-10-03 ENCOUNTER — Ambulatory Visit (INDEPENDENT_AMBULATORY_CARE_PROVIDER_SITE_OTHER): Payer: Medicare PPO | Admitting: Family Medicine

## 2016-10-03 VITALS — BP 128/82 | HR 61 | Temp 97.8°F | Ht 66.0 in | Wt 182.4 lb

## 2016-10-03 DIAGNOSIS — H905 Unspecified sensorineural hearing loss: Secondary | ICD-10-CM

## 2016-10-03 DIAGNOSIS — K760 Fatty (change of) liver, not elsewhere classified: Secondary | ICD-10-CM | POA: Diagnosis not present

## 2016-10-03 DIAGNOSIS — R7303 Prediabetes: Secondary | ICD-10-CM

## 2016-10-03 DIAGNOSIS — R0602 Shortness of breath: Secondary | ICD-10-CM | POA: Diagnosis not present

## 2016-10-03 NOTE — Patient Instructions (Signed)
We will get labs for you today to follow up on your fatty liver / liver function and your blood sugar I will be in touch with your results asap Let's plan to visit in about 6 months

## 2016-10-04 ENCOUNTER — Encounter: Payer: Self-pay | Admitting: Family Medicine

## 2016-10-04 LAB — COMPREHENSIVE METABOLIC PANEL
ALBUMIN: 4.6 g/dL (ref 3.5–5.2)
ALK PHOS: 63 U/L (ref 39–117)
ALT: 34 U/L (ref 0–35)
AST: 21 U/L (ref 0–37)
BUN: 14 mg/dL (ref 6–23)
CALCIUM: 10.3 mg/dL (ref 8.4–10.5)
CHLORIDE: 99 meq/L (ref 96–112)
CO2: 30 mEq/L (ref 19–32)
Creatinine, Ser: 0.65 mg/dL (ref 0.40–1.20)
GFR: 101.11 mL/min (ref >60.00)
Glucose, Bld: 94 mg/dL (ref 70–99)
POTASSIUM: 3.4 meq/L — AB (ref 3.5–5.1)
Sodium: 135 mEq/L (ref 135–145)
TOTAL PROTEIN: 7.7 g/dL (ref 6.0–8.3)
Total Bilirubin: 0.3 mg/dL (ref 0.2–1.2)

## 2016-10-04 LAB — HEMOGLOBIN A1C: HEMOGLOBIN A1C: 5.9 % (ref 4.6–6.5)

## 2016-10-04 LAB — GAMMA GT: GGT: 96 U/L — AB (ref 7–51)

## 2016-10-18 DIAGNOSIS — F3181 Bipolar II disorder: Secondary | ICD-10-CM | POA: Diagnosis not present

## 2016-10-24 DIAGNOSIS — Z1231 Encounter for screening mammogram for malignant neoplasm of breast: Secondary | ICD-10-CM | POA: Diagnosis not present

## 2016-10-24 DIAGNOSIS — R232 Flushing: Secondary | ICD-10-CM | POA: Diagnosis not present

## 2016-10-24 DIAGNOSIS — Z01419 Encounter for gynecological examination (general) (routine) without abnormal findings: Secondary | ICD-10-CM | POA: Diagnosis not present

## 2016-10-24 DIAGNOSIS — Z6828 Body mass index (BMI) 28.0-28.9, adult: Secondary | ICD-10-CM | POA: Diagnosis not present

## 2016-10-24 DIAGNOSIS — Z124 Encounter for screening for malignant neoplasm of cervix: Secondary | ICD-10-CM | POA: Diagnosis not present

## 2016-11-15 ENCOUNTER — Other Ambulatory Visit: Payer: Self-pay | Admitting: Student

## 2016-11-15 DIAGNOSIS — I10 Essential (primary) hypertension: Secondary | ICD-10-CM

## 2016-11-18 ENCOUNTER — Other Ambulatory Visit: Payer: Self-pay | Admitting: Family Medicine

## 2016-11-18 DIAGNOSIS — I10 Essential (primary) hypertension: Secondary | ICD-10-CM

## 2016-12-20 DIAGNOSIS — F3181 Bipolar II disorder: Secondary | ICD-10-CM | POA: Diagnosis not present

## 2017-01-19 DIAGNOSIS — F99 Mental disorder, not otherwise specified: Secondary | ICD-10-CM | POA: Diagnosis not present

## 2017-01-31 NOTE — Progress Notes (Deleted)
[place] at [facility] [address], [place], [place] [address] [phone]  Date:  02/01/2017   Name:  Claudia Gregory   DOB:  1960/08/17   MRN:  [id number]  PCP:  Darreld Mclean, MD    Chief Complaint: No chief complaint on file.   History of Present Illness:  Claudia Gregory is a 56 y.o. very pleasant female patient who presents with the following:  History of anxiety, bipolar disorder, deafness, obesity, hyperlipidemia Here today with concern of   Patient Active Problem List   Diagnosis Date Noted  . Generalized anxiety disorder 12/28/2015  . Elevated liver enzymes 12/28/2015  . Hyperglycemia 12/28/2015  . Plantar fasciitis of right foot 04/01/2015  . Neuropathic pain of both feet 10/27/2014  . Hx of adenomatous polyp of colon 08/08/2014  . Deafness 08/04/2014  . GERD (gastroesophageal reflux disease) 05/13/2014  . Muscle spasm 05/13/2014  . Back pain 09/05/2013  . Hyperlipidemia 02/05/2013  . Hot flashes 11/24/2012  . Menstrual periods irregular 11/16/2012  . OBESITY 10/23/2008  . Hypertension 10/23/2008  . BIPOLAR DISORDER UNSPECIFIED 09/02/2008  . FATIGUE 09/02/2008    Past Medical History:  Diagnosis Date  . Anxiety   . Bipolar 1 disorder ([place])   . Carpal tunnel syndrome of left wrist   . Chest pain, atypical   . Deaf   . Depression   . FATIGUE   . Gallstones   . GERD (gastroesophageal reflux disease)   . Hx of adenomatous polyp of colon 08/08/2014  . Hypertension   . OBESITY   . Pneumonia     Past Surgical History:  Procedure Laterality Date  . CHOLECYSTECTOMY  2000  . COLONOSCOPY  2016   5 mm adenoma  . TUBAL LIGATION  1998    Social History  Substance Use Topics  . Smoking status: Never Smoker  . Smokeless tobacco: Never Used  . Alcohol use 1.2 oz/week    2 Glasses of wine per week     Comment: rare glass of wine     Family History  Problem Relation Age of Onset  . Brain cancer  Mother   . Hypertension Mother   . Diabetes Mother   . Diabetes Maternal Aunt   . Heart attack Maternal Grandmother   . Hypertension Maternal Grandmother   . Colon cancer Neg Hx   . Rectal cancer Neg Hx   . Stomach cancer Neg Hx     No Known Allergies  Medication list has been reviewed and updated.  Current Outpatient Prescriptions on File Prior to Visit  Medication Sig Dispense Refill  . DULoxetine (CYMBALTA) 60 MG capsule Take 1 capsule (60 mg total) by mouth daily. (Patient not taking: Reported on 09/30/2016) 30 capsule 3  . fluticasone (FLONASE) 50 MCG/ACT nasal spray Place 2 sprays into both nostrils daily. (Patient not taking: Reported on 09/30/2016) 16 g 6  . lisinopril-hydrochlorothiazide (PRINZIDE,ZESTORETIC) 20-25 MG tablet Take 1 tablet by mouth daily. 90 tablet 1  . risperiDONE (RISPERDAL) 0.5 MG tablet Take 0.5 mg by mouth daily.     No current facility-administered medications on file prior to visit.     Review of Systems:  As per HPI- otherwise negative.   Physical Examination: There were no vitals filed for this visit. There were no vitals filed for this visit. There is no height or weight on file to calculate BMI. Ideal Body Weight:    GEN: WDWN, NAD, Non-toxic, A &  O x 3 HEENT: Atraumatic, Normocephalic. Neck supple. No masses, No LAD. Ears and Nose: No external deformity. CV: RRR, No M/G/R. No JVD. No thrill. No extra heart sounds. PULM: CTA B, no wheezes, crackles, rhonchi. No retractions. No resp. distress. No accessory muscle use. ABD: S, NT, ND, +BS. No rebound. No HSM. EXTR: No c/c/e NEURO Normal gait.  PSYCH: Normally interactive. Conversant. Not depressed or anxious appearing.  Calm demeanor.    Assessment and Plan: ***  Signed Lamar Blinks, MD

## 2017-02-01 ENCOUNTER — Ambulatory Visit: Payer: Medicare PPO | Admitting: Family Medicine

## 2017-02-01 ENCOUNTER — Telehealth: Payer: Self-pay | Admitting: Family Medicine

## 2017-02-01 NOTE — Telephone Encounter (Signed)
Patient called this morning 02/01/2017, and cancel her appointment at 1:45 pm. Patient stts she will call back to make future appointment. Charge or No Charge.

## 2017-02-01 NOTE — Telephone Encounter (Signed)
No charge please. 

## 2017-02-16 DIAGNOSIS — F99 Mental disorder, not otherwise specified: Secondary | ICD-10-CM | POA: Diagnosis not present

## 2017-04-01 NOTE — Progress Notes (Deleted)
[place] at [facility] [address], [place], [place] [address] [phone]  Date:  04/03/2017   Name:  Claudia Gregory   DOB:  1960-06-25   MRN:  [id number]  PCP:  Claudia Mclean, MD    Chief Complaint: No chief complaint on file.   History of Present Illness:  Claudia Gregory is a 56 y.o. very pleasant female patient who presents with the following:  Here today for a follow-up visit Last seen by myself in July, as follows:  Here today as a new patient.  [facility] pt [facility]- last HPI from February:  In person sign interpreter by the name Claudia Gregory used for this encounter. HPI Feet pain: burning over the bottom of her feet. Right >left. Mostly happens in the evening. This has been going on for two to three months. However,she hashistory of neuropathy in the past. No pain with first step in the morning. Pain comes and goes. No alleviating or aggravating factor. Epson salt helped a little. Denies fever, numbness, weakness, pain with walking or prior injury.  Patient has history of neuropathic pain in the past. She was taken off Lyrica out of concern for fatty liver. She was started on Cymbalta 30 mg daily when she was taken off Lyrica.  PMH/Problem List: has OBESITY; BIPOLAR DISORDER UNSPECIFIED; FATIGUE; Hypertension; Menstrual periods irregular; Hot flashes; Hyperlipidemia; Back pain; GERD (gastroesophageal reflux disease); Muscle spasm; Deafness; Hx of adenomatous polyp of colon; Neuropathic pain of both feet ([place]); Plantar fasciitis of right foot; Generalized anxiety disorder; Elevated liver enzymes; Hyperglycemia; Bleeding from the nose; and Routine adult health maintenance on her problem list.  has a past medical history of Anxiety; Bipolar 1 disorder ([place]); Carpal tunnel syndrome of left wrist; Chest pain, atypical; Deaf; Depression; FATIGUE; Gallstones; GERD (gastroesophageal reflux disease);  adenomatous polyp of colon (08/08/2014); Hypertension; OBESITY; and Pneumonia.  She has congenital deafness as her mother having [place] measles during pregnancy.   She had been on lyrica for her neuropathy- however she was taken off this due to weight gain and put on cymbalta instead.   This does seem to be helping with her foot pain and neuropathy.  The exact cause of her neuropathy is not known.   She does see GYN- she Claudia Gregory.  She is going for her pap and mammo next week  She enjoys reading, yard work, wine tastings, shopping, being outdoors Married - her husband is also deaf.  She has 2 sons who are 44 and 76, and 1 grandaughter who is 44 yo.  Her grand-daughter does live nearby which is a great joy.    My last lab note: Your metabolic profile looks fine- you do have minimally low potassium. You might look up a list of high potassium foods online and be sure to get some potassium in your diet daily.  A1c is still in the pre-diabetes range. We will continue to monitor this.  Your other liver tests (alk phos, AST, ALT) are all normal but your GGT is high. This can be seen in fatty liver and also can suggest alcohol overuse. If you are having more than 1 drink a day, I would suggest cutting back some to de-stress your liver.  Flu: Patient Active Problem List   Diagnosis Date Noted  . Generalized anxiety disorder 12/28/2015  . Elevated liver enzymes 12/28/2015  . Hyperglycemia 12/28/2015  . Plantar fasciitis of right foot 04/01/2015  . Neuropathic pain  of both feet 10/27/2014  . Hx of adenomatous polyp of colon 08/08/2014  . Deafness 08/04/2014  . GERD (gastroesophageal reflux disease) 05/13/2014  . Muscle spasm 05/13/2014  . Back pain 09/05/2013  . Hyperlipidemia 02/05/2013  . Hot flashes 11/24/2012  . Menstrual periods irregular 11/16/2012  . OBESITY 10/23/2008  . Hypertension 10/23/2008  . BIPOLAR DISORDER UNSPECIFIED 09/02/2008  . FATIGUE 09/02/2008    Past Medical  History:  Diagnosis Date  . Anxiety   . Bipolar 1 disorder ([place])   . Carpal tunnel syndrome of left wrist   . Chest pain, atypical   . Deaf   . Depression   . FATIGUE   . Gallstones   . GERD (gastroesophageal reflux disease)   . Hx of adenomatous polyp of colon 08/08/2014  . Hypertension   . OBESITY   . Pneumonia     Past Surgical History:  Procedure Laterality Date  . CHOLECYSTECTOMY  2000  . COLONOSCOPY  2016   5 mm adenoma  . TUBAL LIGATION  1998    Social History   Tobacco Use  . Smoking status: Never Smoker  . Smokeless tobacco: Never Used  Substance Use Topics  . Alcohol use: Yes    Alcohol/week: 1.2 oz    Types: 2 Glasses of wine per week    Comment: rare glass of wine   . Drug use: No    Family History  Problem Relation Age of Onset  . Brain cancer Mother   . Hypertension Mother   . Diabetes Mother   . Diabetes Maternal Aunt   . Heart attack Maternal Grandmother   . Hypertension Maternal Grandmother   . Colon cancer Neg Hx   . Rectal cancer Neg Hx   . Stomach cancer Neg Hx     No Known Allergies  Medication list has been reviewed and updated.  Current Outpatient Medications on File Prior to Visit  Medication Sig Dispense Refill  . DULoxetine (CYMBALTA) 60 MG capsule Take 1 capsule (60 mg total) by mouth daily. (Patient not taking: Reported on 09/30/2016) 30 capsule 3  . fluticasone (FLONASE) 50 MCG/ACT nasal spray Place 2 sprays into both nostrils daily. (Patient not taking: Reported on 09/30/2016) 16 g 6  . lisinopril-hydrochlorothiazide (PRINZIDE,ZESTORETIC) 20-25 MG tablet Take 1 tablet by mouth daily. 90 tablet 1  . risperiDONE (RISPERDAL) 0.5 MG tablet Take 0.5 mg by mouth daily.     No current facility-administered medications on file prior to visit.     Review of Systems:  ***  Physical Examination: There were no vitals filed for this visit. There were no vitals filed for this visit. There is no Gregory or weight on file to calculate  BMI. Ideal Body Weight:    ***  Assessment and Plan: ***  Signed Claudia Blinks, MD

## 2017-04-03 ENCOUNTER — Ambulatory Visit: Payer: Medicare PPO | Admitting: Family Medicine

## 2017-04-07 NOTE — Progress Notes (Signed)
error 

## 2017-04-10 ENCOUNTER — Encounter: Payer: Self-pay | Admitting: Family Medicine

## 2017-04-10 ENCOUNTER — Ambulatory Visit: Payer: Medicare PPO | Admitting: Family Medicine

## 2017-04-10 ENCOUNTER — Ambulatory Visit (HOSPITAL_BASED_OUTPATIENT_CLINIC_OR_DEPARTMENT_OTHER)
Admission: RE | Admit: 2017-04-10 | Discharge: 2017-04-10 | Disposition: A | Discharge status: Home / Self Care | Payer: Medicare PPO | Source: Ambulatory Visit | Attending: Family Medicine | Admitting: Family Medicine

## 2017-04-10 VITALS — BP 136/80 | HR 63 | Temp 98.1°F | Resp 16 | Ht 66.0 in | Wt 184.4 lb

## 2017-04-10 DIAGNOSIS — M5030 Other cervical disc degeneration, unspecified cervical region: Secondary | ICD-10-CM | POA: Insufficient documentation

## 2017-04-10 DIAGNOSIS — K760 Fatty (change of) liver, not elsewhere classified: Secondary | ICD-10-CM

## 2017-04-10 DIAGNOSIS — E785 Hyperlipidemia, unspecified: Secondary | ICD-10-CM | POA: Diagnosis not present

## 2017-04-10 DIAGNOSIS — H905 Unspecified sensorineural hearing loss: Secondary | ICD-10-CM

## 2017-04-10 DIAGNOSIS — R7303 Prediabetes: Secondary | ICD-10-CM

## 2017-04-10 DIAGNOSIS — M542 Cervicalgia: Secondary | ICD-10-CM | POA: Diagnosis not present

## 2017-04-10 DIAGNOSIS — I10 Essential (primary) hypertension: Secondary | ICD-10-CM

## 2017-04-10 LAB — COMPREHENSIVE METABOLIC PANEL
ALBUMIN: 4.2 g/dL (ref 3.5–5.2)
ALT: 33 U/L (ref 0–35)
AST: 19 U/L (ref 0–37)
Alkaline Phosphatase: 61 U/L (ref 39–117)
BILIRUBIN TOTAL: 0.3 mg/dL (ref 0.2–1.2)
BUN: 13 mg/dL (ref 6–23)
CALCIUM: 9.5 mg/dL (ref 8.4–10.5)
CHLORIDE: 102 meq/L (ref 96–112)
CO2: 28 meq/L (ref 19–32)
Creatinine, Ser: 0.62 mg/dL (ref 0.40–1.20)
GFR: 106.57 mL/min (ref >60.00)
Glucose, Bld: 133 mg/dL — ABNORMAL HIGH (ref 70–99)
Potassium: 3.7 mEq/L (ref 3.5–5.1)
Sodium: 136 mEq/L (ref 135–145)
Total Protein: 6.8 g/dL (ref 6.0–8.3)

## 2017-04-10 LAB — CBC
HEMATOCRIT: 38.9 % (ref 36.0–46.0)
HEMOGLOBIN: 13.2 g/dL (ref 12.0–15.0)
MCHC: 33.8 g/dL (ref 30.0–36.0)
MCV: 88.2 fl (ref 78.0–100.0)
PLATELETS: 230 10*3/uL (ref 150.0–400.0)
RBC: 4.41 Mil/uL (ref 3.87–5.11)
RDW: 12.6 % (ref 11.5–15.5)
WBC: 6.5 10*3/uL (ref 4.0–10.5)

## 2017-04-10 LAB — LIPID PANEL
CHOL/HDL RATIO: 5
CHOLESTEROL: 231 mg/dL — AB (ref 0–200)
HDL: 49.9 mg/dL (ref >39.00)
NonHDL: 180.95
TRIGLYCERIDES: 243 mg/dL — AB (ref 0.0–149.0)
VLDL: 48.6 mg/dL — ABNORMAL HIGH (ref 0.0–40.0)

## 2017-04-10 LAB — HEMOGLOBIN A1C: Hgb A1c MFr Bld: 5.9 % (ref 4.6–6.5)

## 2017-04-10 LAB — GAMMA GT: GGT: 91 U/L — ABNORMAL HIGH (ref 7–51)

## 2017-04-10 LAB — LDL CHOLESTEROL, DIRECT: Direct LDL: 157 mg/dL

## 2017-04-10 MED ORDER — LISINOPRIL-HYDROCHLOROTHIAZIDE 20-25 MG PO TABS
1.0000 | ORAL_TABLET | Freq: Every day | ORAL | 1 refills | Status: DC
Start: 2017-04-10 — End: 2017-12-18

## 2017-04-10 MED ORDER — METHOCARBAMOL 500 MG PO TABS: 500.0000 mg | ORAL_TABLET | Freq: Every evening | ORAL | 3 refills | Status: DC | PRN
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
## 2017-04-10 NOTE — Patient Instructions (Addendum)
Please have your labs drawn, and then go to the ground floor for x-rays of your neck You can then head home and I will be in touch with your results asap I gave you an rx for robaxin to try- this is a mild muscle relaxer, it may cause you to feel a bit sleepy.  Use it at bedtime  For the post- meal cough, please try taking some OTC zantac for a couple of weeks.  However, if this issue presists we may need you to see GI.  Please keep me posted!   Take care!  Good to see you today

## 2017-04-12 MED ORDER — PRAVASTATIN SODIUM 20 MG PO TABS: 20.0000 mg | ORAL_TABLET | Freq: Every day | ORAL | 3 refills | Status: DC

## 2017-04-18 DIAGNOSIS — F319 Bipolar disorder, unspecified: Secondary | ICD-10-CM | POA: Diagnosis not present

## 2017-06-19 ENCOUNTER — Encounter: Payer: Self-pay | Admitting: Family Medicine

## 2017-06-19 ENCOUNTER — Ambulatory Visit (HOSPITAL_BASED_OUTPATIENT_CLINIC_OR_DEPARTMENT_OTHER)
Admission: RE | Admit: 2017-06-19 | Discharge: 2017-06-19 | Disposition: A | Discharge status: Home / Self Care | Payer: Medicare PPO | Source: Ambulatory Visit | Attending: Family Medicine | Admitting: Family Medicine

## 2017-06-19 ENCOUNTER — Ambulatory Visit: Payer: Medicare PPO | Admitting: Family Medicine

## 2017-06-19 ENCOUNTER — Ambulatory Visit: Payer: Self-pay

## 2017-06-19 VITALS — BP 147/86 | HR 61 | Resp 16 | Ht 64.0 in | Wt 183.4 lb

## 2017-06-19 DIAGNOSIS — R0602 Shortness of breath: Secondary | ICD-10-CM

## 2017-06-19 DIAGNOSIS — R059 Cough, unspecified: Secondary | ICD-10-CM

## 2017-06-19 DIAGNOSIS — R05 Cough: Secondary | ICD-10-CM | POA: Diagnosis not present

## 2017-06-19 LAB — D-DIMER, QUANTITATIVE (NOT AT ARMC): D DIMER QUANT: 0.28 ug{FEU}/mL (ref <0.50)

## 2017-06-19 MED ORDER — DOXYCYCLINE HYCLATE 100 MG PO CAPS: 100.0000 mg | ORAL_CAPSULE | Freq: Two times a day (BID) | ORAL | 0 refills | Status: DC

## 2017-06-19 MED ORDER — ALBUTEROL SULFATE HFA 108 (90 BASE) MCG/ACT IN AERS
2.0000 | INHALATION_SPRAY | Freq: Four times a day (QID) | RESPIRATORY_TRACT | 0 refills | Status: DC | PRN
Start: 2017-06-19 — End: 2018-01-23

## 2017-06-19 MED ORDER — IPRATROPIUM-ALBUTEROL 0.5-2.5 (3) MG/3ML IN SOLN
3.0000 mL | Freq: Once | RESPIRATORY_TRACT | Status: AC
  Administered 2017-06-21: 3 mL via RESPIRATORY_TRACT

## 2017-06-19 NOTE — Patient Instructions (Signed)
Good to see you today! I hope that you are feeling better soon We are going to check a D dimer today to make sure no sign of a blood clot in your lungs.  I will get this test back later today.  If positive we will need to do a CT scan of your lungs to make sure all is well  Otherwise, we will plan to treat you with doxycycline- antibiotic- twice a day for 10 days.  Take with food and a glass of water! Also use the albuterol inhaler as needed for wheezing or shortness of breath

## 2017-06-19 NOTE — Progress Notes (Signed)
[place] at [facility] [place], [place], [place] [address] [phone]  Date:  06/19/2017   Name:  Claudia Gregory   DOB:  18-Oct-1960   MRN:  [id number]  PCP:  Darreld Mclean, MD    Chief Complaint: URI (cough, shortness of breath, 1 week)   History of Present Illness:  Claudia Gregory is a 57 y.o. very pleasant female patient who presents with the following: History of hyperlipidemia, HTN, bipolar disorder and congenital deafness Visit completed with assistance of sign language interpreter Here today with sx of illness for about one week.   Started with a cough and chest congestion She feels like there is mucus in her chest/ throat.  She feels like she is SOB She has not noted these sx in the past that she can recall No fever but she has felt hot She will get some blood from her nose when she blows it  Otherwise no bleeding such as blood in her urine  She is coughing quite a bit When this started she felt like she might be getting a cold or some other illness- however it has persisted longer than she expected She has tried some aleve, and a nose spray  No vomiting or diarrhea   She does not have any history of asthma. Did have pneumonia in 2012  No Cp No history of glaucoma  Never had a blood clot No long trips Never a smoker No pain or swelling of her legs Patient Active Problem List   Diagnosis Date Noted  . Generalized anxiety disorder 12/28/2015  . Elevated liver enzymes 12/28/2015  . Hyperglycemia 12/28/2015  . Plantar fasciitis of right foot 04/01/2015  . Neuropathic pain of both feet 10/27/2014  . Hx of adenomatous polyp of colon 08/08/2014  . Deafness 08/04/2014  . GERD (gastroesophageal reflux disease) 05/13/2014  . Muscle spasm 05/13/2014  . Back pain 09/05/2013  . Hyperlipidemia 02/05/2013  . Hot flashes 11/24/2012  . Menstrual periods irregular 11/16/2012  . OBESITY 10/23/2008  .  Hypertension 10/23/2008  . BIPOLAR DISORDER UNSPECIFIED 09/02/2008  . FATIGUE 09/02/2008    Past Medical History:  Diagnosis Date  . Anxiety   . Bipolar 1 disorder ([place])   . Carpal tunnel syndrome of left wrist   . Chest pain, atypical   . Deaf   . Depression   . FATIGUE   . Gallstones   . GERD (gastroesophageal reflux disease)   . Hx of adenomatous polyp of colon 08/08/2014  . Hypertension   . OBESITY   . Pneumonia     Past Surgical History:  Procedure Laterality Date  . CHOLECYSTECTOMY  2000  . COLONOSCOPY  2016   5 mm adenoma  . TUBAL LIGATION  1998    Social History   Tobacco Use  . Smoking status: Never Smoker  . Smokeless tobacco: Never Used  Substance Use Topics  . Alcohol use: Yes    Alcohol/week: 1.2 oz    Types: 2 Glasses of wine per week    Comment: rare glass of wine   . Drug use: No    Family History  Problem Relation Age of Onset  . Brain cancer Mother   . Hypertension Mother   . Diabetes Mother   . Diabetes Maternal Aunt   . Heart attack Maternal Grandmother   . Hypertension Maternal Grandmother   . Colon cancer Neg Hx   . Rectal cancer Neg Hx   .  Stomach cancer Neg Hx     No Known Allergies  Medication list has been reviewed and updated.  Current Outpatient Medications on File Prior to Visit  Medication Sig Dispense Refill  . DULoxetine (CYMBALTA) 60 MG capsule Take 1 capsule (60 mg total) by mouth daily. (Patient not taking: Reported on 09/30/2016) 30 capsule 3  . fluticasone (FLONASE) 50 MCG/ACT nasal spray Place 2 sprays into both nostrils daily. (Patient not taking: Reported on 09/30/2016) 16 g 6  . lisinopril-hydrochlorothiazide (PRINZIDE,ZESTORETIC) 20-25 MG tablet Take 1 tablet by mouth daily. 90 tablet 1  . methocarbamol (ROBAXIN) 500 MG tablet Take 1 tablet (500 mg total) by mouth at bedtime as needed for muscle spasms. 30 tablet 3  . pravastatin (PRAVACHOL) 20 MG tablet Take 1 tablet (20 mg total) by mouth daily. 90 tablet 3  .  risperiDONE (RISPERDAL) 0.5 MG tablet Take 0.5 mg by mouth daily.     No current facility-administered medications on file prior to visit.     Review of Systems:  As per HPI- otherwise negative.   Physical Examination: Vitals:   06/19/17 1437  BP: (!) 147/86  Pulse: 61  Resp: 16  SpO2: 98%   Vitals:   06/19/17 1437  Weight: 183 lb 6.4 oz (83.2 kg)  Height: 5\' 4"  (1.626 m)   Body mass index is 31.48 kg/m. Ideal Body Weight: Weight in (lb) to have BMI = 25: 145.3  GEN: WDWN, NAD, Non-toxic, A & O x 3, obese, otherwise looks well  HEENT: Atraumatic, Normocephalic. Neck supple. No masses, No LAD.  Bilateral TM wnl, oropharynx normal.  PEERL,EOMI.   Ears and Nose: No external deformity. CV: RRR, No M/G/R. No JVD. No thrill. No extra heart sounds. PULM: CTA B, no wheezes, crackles, rhonchi. No retractions. No resp. distress. No accessory muscle use. ABD: S, NT, ND No calf swelling or tenderness, no cords palpated EXTR: No c/c/e NEURO Normal gait.  PSYCH: Normally interactive. Conversant. Not depressed or anxious appearing.  Calm demeanor.   Given duoneb which significantly improved her sx  Results for orders placed or performed in visit on 06/19/17  D-Dimer, Quantitative  Result Value Ref Range   D-Dimer, Quant 0.28 <0.50 mcg/mL FEU    Assessment and Plan: Cough - Plan: ipratropium-albuterol (DUONEB) 0.5-2.5 (3) MG/3ML nebulizer solution 3 mL, DG Chest 2 View, doxycycline (VIBRAMYCIN) 100 MG capsule, albuterol (PROVENTIL HFA;VENTOLIN HFA) 108 (90 Base) MCG/ACT inhaler  Shortness of breath - Plan: DG Chest 2 View, D-Dimer, Quantitative  Here today with SOB and cough Likely due to a URI/ bronchitis.  However discussed possibility of a PE due to SOB.  Pt would like to have a D dimer performed, she understands that a CT will be needed if positive result.   Otherwise plan to treat with doxycycline and albuterol nebs prn  Signed Lamar Blinks, MD  Received her d dimer,  message to pt- negative  Dg Chest 2 View  Result Date: 06/19/2017 CLINICAL DATA:  Cough and congestion EXAM: CHEST - 2 VIEW COMPARISON:  05/30/2014 FINDINGS: The heart size and mediastinal contours are within normal limits. Both lungs are clear. The visualized skeletal structures are unremarkable. IMPRESSION: No active cardiopulmonary disease. Electronically Signed   By: Kathreen Devoid   On: 06/19/2017 15:24

## 2017-06-19 NOTE — Telephone Encounter (Signed)
Pt. Reports she started feeling bad last week with cough and shortness of breath. Reports she feels worse today. Denies fever or other symptoms. Concerned that she is not feeling better, especially because she is starting a new job next week. Appointment for today.  Reason for Disposition . SEVERE coughing spells (e.g., whooping sound after coughing, vomiting after coughing)  Answer Assessment - Initial Assessment Questions 1. ONSET: "When did the cough begin?"      Last week 2. SEVERITY: "How bad is the cough today?"      Moderate 3. RESPIRATORY DISTRESS: "Describe your breathing."      No distress 4. FEVER: "Do you have a fever?" If so, ask: "What is your temperature, how was it measured, and when did it start?"     No 5. HEMOPTYSIS: "Are you coughing up any blood?" If so ask: "How much?" (flecks, streaks, tablespoons, etc.)     Nose bleeds at intervals  6. TREATMENT: "What have you done so far to treat the cough?" (e.g., meds, fluids, humidifier)     No 7. CARDIAC HISTORY: "Do you have any history of heart disease?" (e.g., heart attack, congestive heart failure)      HTN 8. LUNG HISTORY: "Do you have any history of lung disease?"  (e.g., pulmonary embolus, asthma, emphysema)     No 9. PE RISK FACTORS: "Do you have a history of blood clots?" (or: recent major surgery, recent prolonged travel, bedridden )     No 10. OTHER SYMPTOMS: "Do you have any other symptoms? (e.g., runny nose, wheezing, chest pain)       Feels a rattling 11. PREGNANCY: "Is there any chance you are pregnant?" "When was your last menstrual period?"       No 12. TRAVEL: "Have you traveled out of the country in the last month?" (e.g., travel history, exposures)       No  Protocols used: COUGH - ACUTE NON-PRODUCTIVE-A-AH

## 2017-06-21 DIAGNOSIS — R05 Cough: Secondary | ICD-10-CM | POA: Diagnosis not present

## 2017-08-21 NOTE — Progress Notes (Deleted)
[place] at [facility] [address] [place], [place] [address] [phone]  Date:  08/23/2017   Name:  Claudia Gregory   DOB:  [birth year]   MRN:  [id number]  PCP:  Darreld Mclean, MD    Chief Complaint: No chief complaint on file.   History of Present Illness:  Claudia Gregory is a 57 y.o. very pleasant female patient who presents with the following:  History of hyperlipidemia, HTN, bipolar disorder and congenital deafness   Patient Active Problem List   Diagnosis Date Noted  . Generalized anxiety disorder 12/28/2015  . Elevated liver enzymes 12/28/2015  . Hyperglycemia 12/28/2015  . Plantar fasciitis of right foot 04/01/2015  . Neuropathic pain of both feet 10/27/2014  . Hx of adenomatous polyp of colon 08/08/2014  . Deafness 08/04/2014  . GERD (gastroesophageal reflux disease) 05/13/2014  . Hyperlipidemia 02/05/2013  . Hot flashes 11/24/2012  . Menstrual periods irregular 11/16/2012  . OBESITY 10/23/2008  . Hypertension 10/23/2008  . BIPOLAR DISORDER UNSPECIFIED 09/02/2008    Past Medical History:  Diagnosis Date  . Anxiety   . Bipolar 1 disorder ([place])   . Carpal tunnel syndrome of left wrist   . Chest pain, atypical   . Deaf   . Depression   . FATIGUE   . Gallstones   . GERD (gastroesophageal reflux disease)   . Hx of adenomatous polyp of colon 08/08/2014  . Hypertension   . OBESITY   . Pneumonia     Past Surgical History:  Procedure Laterality Date  . CHOLECYSTECTOMY  2000  . COLONOSCOPY  2016   5 mm adenoma  . TUBAL LIGATION  1998    Social History   Tobacco Use  . Smoking status: Never Smoker  . Smokeless tobacco: Never Used  Substance Use Topics  . Alcohol use: Yes    Alcohol/week: 1.2 oz    Types: 2 Glasses of wine per week    Comment: rare glass of wine   . Drug use: No    Family History  Problem Relation Age of Onset  . Brain cancer Mother   . Hypertension Mother   . Diabetes  Mother   . Diabetes Maternal Aunt   . Heart attack Maternal Grandmother   . Hypertension Maternal Grandmother   . Colon cancer Neg Hx   . Rectal cancer Neg Hx   . Stomach cancer Neg Hx     No Known Allergies  Medication list has been reviewed and updated.  Current Outpatient Medications on File Prior to Visit  Medication Sig Dispense Refill  . albuterol (PROVENTIL HFA;VENTOLIN HFA) 108 (90 Base) MCG/ACT inhaler Inhale 2 puffs into the lungs every 6 (six) hours as needed for wheezing or shortness of breath. 1 Inhaler 0  . doxycycline (VIBRAMYCIN) 100 MG capsule Take 1 capsule (100 mg total) by mouth 2 (two) times daily. 20 capsule 0  . DULoxetine (CYMBALTA) 60 MG capsule Take 1 capsule (60 mg total) by mouth daily. (Patient not taking: Reported on 09/30/2016) 30 capsule 3  . fluticasone (FLONASE) 50 MCG/ACT nasal spray Place 2 sprays into both nostrils daily. (Patient not taking: Reported on 09/30/2016) 16 g 6  . lisinopril-hydrochlorothiazide (PRINZIDE,ZESTORETIC) 20-25 MG tablet Take 1 tablet by mouth daily. 90 tablet 1  . methocarbamol (ROBAXIN) 500 MG tablet Take 1 tablet (500 mg total) by mouth at bedtime as needed for muscle spasms. 30 tablet 3  . pravastatin (PRAVACHOL) 20 MG tablet  Take 1 tablet (20 mg total) by mouth daily. 90 tablet 3  . risperiDONE (RISPERDAL) 0.5 MG tablet Take 0.5 mg by mouth daily.     No current facility-administered medications on file prior to visit.     Review of Systems:  .aspesr   Physical Examination: There were no vitals filed for this visit. There were no vitals filed for this visit. There is no height or weight on file to calculate BMI. Ideal Body Weight:    GEN: WDWN, NAD, Non-toxic, A & O x 3 HEENT: Atraumatic, Normocephalic. Neck supple. No masses, No LAD. Ears and Nose: No external deformity. CV: RRR, No M/G/R. No JVD. No thrill. No extra heart sounds. PULM: CTA B, no wheezes, crackles, rhonchi. No retractions. No resp. distress. No  accessory muscle use. ABD: S, NT, ND, +BS. No rebound. No HSM. EXTR: No c/c/e NEURO Normal gait.  PSYCH: Normally interactive. Conversant. Not depressed or anxious appearing.  Calm demeanor.    Assessment and Plan: ***  Signed Lamar Blinks, MD

## 2017-08-23 ENCOUNTER — Ambulatory Visit: Payer: Medicare PPO | Admitting: Family Medicine

## 2017-09-19 DIAGNOSIS — F99 Mental disorder, not otherwise specified: Secondary | ICD-10-CM | POA: Diagnosis not present

## 2017-09-26 ENCOUNTER — Encounter: Payer: Self-pay | Admitting: Family Medicine

## 2017-10-06 NOTE — Progress Notes (Deleted)
[place] at [facility] [address], [place], [place] [address] [phone]  Date:  10/09/2017   Name:  Claudia Gregory   DOB:  1960/08/12   MRN:  [id number]  PCP:  Darreld Mclean, MD    Chief Complaint: No chief complaint on file.   History of Present Illness:  Claudia Gregory is a 57 y.o. very pleasant female patient who presents with the following:  Here today with concern of GERD and feet burning,not sleeping well  Last visit in March: History of hyperlipidemia, HTN, bipolar disorder and congenital deafness Visit completed with assistance of sign language interpreter   Patient Active Problem List   Diagnosis Date Noted  . Generalized anxiety disorder 12/28/2015  . Elevated liver enzymes 12/28/2015  . Hyperglycemia 12/28/2015  . Plantar fasciitis of right foot 04/01/2015  . Neuropathic pain of both feet 10/27/2014  . Hx of adenomatous polyp of colon 08/08/2014  . Deafness 08/04/2014  . GERD (gastroesophageal reflux disease) 05/13/2014  . Hyperlipidemia 02/05/2013  . Hot flashes 11/24/2012  . Menstrual periods irregular 11/16/2012  . OBESITY 10/23/2008  . Hypertension 10/23/2008  . BIPOLAR DISORDER UNSPECIFIED 09/02/2008    Past Medical History:  Diagnosis Date  . Anxiety   . Bipolar 1 disorder ([place])   . Carpal tunnel syndrome of left wrist   . Chest pain, atypical   . Deaf   . Depression   . FATIGUE   . Gallstones   . GERD (gastroesophageal reflux disease)   . Hx of adenomatous polyp of colon 08/08/2014  . Hypertension   . OBESITY   . Pneumonia     Past Surgical History:  Procedure Laterality Date  . CHOLECYSTECTOMY  2000  . COLONOSCOPY  2016   5 mm adenoma  . TUBAL LIGATION  1998    Social History   Tobacco Use  . Smoking status: Never Smoker  . Smokeless tobacco: Never Used  Substance Use Topics  . Alcohol use: Yes    Alcohol/week: 1.2 oz    Types: 2 Glasses of wine per week    Comment:  rare glass of wine   . Drug use: No    Family History  Problem Relation Age of Onset  . Brain cancer Mother   . Hypertension Mother   . Diabetes Mother   . Diabetes Maternal Aunt   . Heart attack Maternal Grandmother   . Hypertension Maternal Grandmother   . Colon cancer Neg Hx   . Rectal cancer Neg Hx   . Stomach cancer Neg Hx     No Known Allergies  Medication list has been reviewed and updated.  Current Outpatient Medications on File Prior to Visit  Medication Sig Dispense Refill  . albuterol (PROVENTIL HFA;VENTOLIN HFA) 108 (90 Base) MCG/ACT inhaler Inhale 2 puffs into the lungs every 6 (six) hours as needed for wheezing or shortness of breath. 1 Inhaler 0  . doxycycline (VIBRAMYCIN) 100 MG capsule Take 1 capsule (100 mg total) by mouth 2 (two) times daily. 20 capsule 0  . DULoxetine (CYMBALTA) 60 MG capsule Take 1 capsule (60 mg total) by mouth daily. (Patient not taking: Reported on 09/30/2016) 30 capsule 3  . fluticasone (FLONASE) 50 MCG/ACT nasal spray Place 2 sprays into both nostrils daily. (Patient not taking: Reported on 09/30/2016) 16 g 6  . lisinopril-hydrochlorothiazide (PRINZIDE,ZESTORETIC) 20-25 MG tablet Take 1 tablet by mouth daily. 90 tablet 1  . methocarbamol (ROBAXIN) 500 MG tablet Take  1 tablet (500 mg total) by mouth at bedtime as needed for muscle spasms. 30 tablet 3  . pravastatin (PRAVACHOL) 20 MG tablet Take 1 tablet (20 mg total) by mouth daily. 90 tablet 3  . risperiDONE (RISPERDAL) 0.5 MG tablet Take 0.5 mg by mouth daily.     No current facility-administered medications on file prior to visit.     Review of Systems:  As per HPI- otherwise negative.   Physical Examination: There were no vitals filed for this visit. There were no vitals filed for this visit. There is no height or weight on file to calculate BMI. Ideal Body Weight:    GEN: WDWN, NAD, Non-toxic, A & O x 3 HEENT: Atraumatic, Normocephalic. Neck supple. No masses, No LAD. Ears  and Nose: No external deformity. CV: RRR, No M/G/R. No JVD. No thrill. No extra heart sounds. PULM: CTA B, no wheezes, crackles, rhonchi. No retractions. No resp. distress. No accessory muscle use. ABD: S, NT, ND, +BS. No rebound. No HSM. EXTR: No c/c/e NEURO Normal gait.  PSYCH: Normally interactive. Conversant. Not depressed or anxious appearing.  Calm demeanor.    Assessment and Plan: ***  Signed Lamar Blinks, MD

## 2017-10-09 ENCOUNTER — Ambulatory Visit: Payer: Medicare PPO | Admitting: Family Medicine

## 2017-10-28 NOTE — Progress Notes (Addendum)
[place] at [facility] [place], [place], [place] [address] [phone]  Date:  10/30/2017   Name:  Claudia Gregory   DOB:  [birth year]   MRN:  [id number]  PCP:  Darreld Mclean, MD    Chief Complaint: Gastroesophageal Reflux (coughing, no better, 1 year) and Fatigue (feeling confused)   History of Present Illness:  Claudia Gregory is a 57 y.o. very pleasant female patient who presents with the following:  Here today with concern of reflux and her feet burning History of anxiety, neuropathy of feet, deafness, GERD, hyperlipidemia   Pap: last done at [facility], she thinks this was done 2 years ago.  Never had an abnl  Mammo: last done 2 years ago she thinks.  At [facility] Full labs completed in January  Pt seen today with the help of a sign interpreter Her feet seem to be better- she got a new job and is doing less standing so this is better  She notes that she is having a lot of coughing after she eats.  She is ok as long as she does not eat She feels "like I'm in a fog," she has noted this since she started menopause  She last had a bleed 1-2 years ago- however she feels like her fatigue is getting worse She does feels like something might be getting stuck in her throat No regurg of food She has not necessarily noted any acidic taste in her mouth No vomiting Her appetite is about the same   She has tried zantac but it did not help that much  Wt Readings from Last 3 Encounters:  10/30/17 186 lb (84.4 kg)  06/19/17 183 lb 6.4 oz (83.2 kg)  04/10/17 184 lb 6.4 oz (83.6 kg)   prilosec reisperdal Prinzide for BP zoloft pravachol   Patient Active Problem List   Diagnosis Date Noted  . Generalized anxiety disorder 12/28/2015  . Elevated liver enzymes 12/28/2015  . Hyperglycemia 12/28/2015  . Plantar fasciitis of right foot 04/01/2015  . Neuropathic pain of both feet 10/27/2014  . Hx of adenomatous polyp of colon 08/08/2014   . Deafness 08/04/2014  . GERD (gastroesophageal reflux disease) 05/13/2014  . Hyperlipidemia 02/05/2013  . Hot flashes 11/24/2012  . OBESITY 10/23/2008  . Hypertension 10/23/2008  . BIPOLAR DISORDER UNSPECIFIED 09/02/2008    Past Medical History:  Diagnosis Date  . Anxiety   . Bipolar 1 disorder ([place])   . Carpal tunnel syndrome of left wrist   . Chest pain, atypical   . Deaf   . Depression   . FATIGUE   . Gallstones   . GERD (gastroesophageal reflux disease)   . Hx of adenomatous polyp of colon 08/08/2014  . Hypertension   . OBESITY   . Pneumonia     Past Surgical History:  Procedure Laterality Date  . CHOLECYSTECTOMY  2000  . COLONOSCOPY  2016   5 mm adenoma  . TUBAL LIGATION  1998    Social History   Tobacco Use  . Smoking status: Never Smoker  . Smokeless tobacco: Never Used  Substance Use Topics  . Alcohol use: Yes    Alcohol/week: 1.2 oz    Types: 2 Glasses of wine per week    Comment: rare glass of wine   . Drug use: No    Family History  Problem Relation Age of Onset  . Brain cancer Mother   . Hypertension Mother   .  Diabetes Mother   . Diabetes Maternal Aunt   . Heart attack Maternal Grandmother   . Hypertension Maternal Grandmother   . Colon cancer Neg Hx   . Rectal cancer Neg Hx   . Stomach cancer Neg Hx     No Known Allergies  Medication list has been reviewed and updated.  Current Outpatient Medications on File Prior to Visit  Medication Sig Dispense Refill  . albuterol (PROVENTIL HFA;VENTOLIN HFA) 108 (90 Base) MCG/ACT inhaler Inhale 2 puffs into the lungs every 6 (six) hours as needed for wheezing or shortness of breath. 1 Inhaler 0  . doxycycline (VIBRAMYCIN) 100 MG capsule Take 1 capsule (100 mg total) by mouth 2 (two) times daily. 20 capsule 0  . lisinopril-hydrochlorothiazide (PRINZIDE,ZESTORETIC) 20-25 MG tablet Take 1 tablet by mouth daily. 90 tablet 1  . pravastatin (PRAVACHOL) 20 MG tablet Take 1 tablet (20 mg total) by mouth  daily. 90 tablet 3  . risperiDONE (RISPERDAL) 0.5 MG tablet Take 0.5 mg by mouth daily.    . sertraline (ZOLOFT) 100 MG tablet Take 100 mg by mouth daily.     No current facility-administered medications on file prior to visit.     Review of Systems:  As per HPI- otherwise negative.   Physical Examination: Vitals:   10/30/17 1418  BP: 112/70  Pulse: 63  Resp: 16  SpO2: 98%   Vitals:   10/30/17 1418  Weight: 186 lb (84.4 kg)  Height: 5\' 4"  (1.626 m)   Body mass index is 31.93 kg/m. Ideal Body Weight: Weight in (lb) to have BMI = 25: 145.3  GEN: WDWN, NAD, Non-toxic, A & O x 3, looks well, overweight HEENT: Atraumatic, Normocephalic. Neck supple. No masses, No LAD.  Bilateral TM wnl, oropharynx normal.  PEERL,EOMI.   Ears and Nose: No external deformity. CV: RRR, No M/G/R. No JVD. No thrill. No extra heart sounds. PULM: CTA B, no wheezes, crackles, rhonchi. No retractions. No resp. distress. No accessory muscle use. ABD: S, NT, ND, +BS. No rebound. No HSM. EXTR: No c/c/e NEURO Normal gait.  PSYCH: Normally interactive. Conversant. Not depressed or anxious appearing.  Calm demeanor.    Assessment and Plan: Gastroesophageal reflux disease, esophagitis presence not specified - Plan: H. pylori breath test, DISCONTINUED: omeprazole (PRILOSEC) 40 MG capsule  Pre-diabetes - Plan: Hemoglobin A1c  Screening for breast cancer - Plan: MM 3D SCREEN BREAST BILATERAL  Neuropathy is actually better at this time. Will continue to monitor A1c pending Ordered mammo for her today Somewhat vague sensation of having "air in her throat" when swallowing and of coughing with eating.  Will check H pylori today and start on a PPI for a month- we will follow-up Offered to do a pap today but she prefers to return later which is fine   Signed Lamar Blinks, MD  Received her labs, message to pt 7/30  Your A1c continues to be in the low pre-diabetes range, stable.  We will continue to  monitor this Your H pylori was positive;  Let's try treating this to try and improve your GERD symptoms   Continue to take the prilosec once a day. We are going to add 2 antibiotics that you will take twice a day for 2 weeks- clarithromycin and amoxicillin.   Please let me know how you are feeling in about one month, or sooner if you are not doing ok I would recommend that you take a daily probiotic while you are on these antibiotics!  Results for  orders placed or performed in visit on 10/30/17  Hemoglobin A1c  Result Value Ref Range   Hgb A1c MFr Bld 5.9 4.6 - 6.5 %  H. pylori breath test  Result Value Ref Range   H. pylori Breath Test DETECTED (A) NOT DETECT   H pylori is positive, need to treat.   A1c is ok, stable Message to pt   Clarithromycin triple? PPI (standard* or double dose) Twice daily 14 Yes   Clarithromycin (500 mg) Twice daily     Amoxicillin (1 gram) or Metronidazole (500 mg) Twice daily (amoxicillin) Three times daily (metronidazole)

## 2017-10-30 ENCOUNTER — Other Ambulatory Visit: Payer: Self-pay | Admitting: Family Medicine

## 2017-10-30 ENCOUNTER — Encounter: Payer: Self-pay | Admitting: Family Medicine

## 2017-10-30 ENCOUNTER — Ambulatory Visit: Payer: Medicare PPO | Admitting: Family Medicine

## 2017-10-30 VITALS — BP 112/70 | HR 63 | Resp 16 | Ht 64.0 in | Wt 186.0 lb

## 2017-10-30 DIAGNOSIS — Z1239 Encounter for other screening for malignant neoplasm of breast: Secondary | ICD-10-CM

## 2017-10-30 DIAGNOSIS — G5793 Unspecified mononeuropathy of bilateral lower limbs: Secondary | ICD-10-CM

## 2017-10-30 DIAGNOSIS — A048 Other specified bacterial intestinal infections: Secondary | ICD-10-CM | POA: Diagnosis not present

## 2017-10-30 DIAGNOSIS — Z1231 Encounter for screening mammogram for malignant neoplasm of breast: Secondary | ICD-10-CM

## 2017-10-30 DIAGNOSIS — K219 Gastro-esophageal reflux disease without esophagitis: Secondary | ICD-10-CM

## 2017-10-30 DIAGNOSIS — R7303 Prediabetes: Secondary | ICD-10-CM

## 2017-10-30 MED ORDER — OMEPRAZOLE 40 MG PO CPDR: 40.0000 mg | DELAYED_RELEASE_CAPSULE | Freq: Every day | ORAL | 0 refills | Status: DC

## 2017-10-30 NOTE — Patient Instructions (Signed)
It was nice to see you today- have a wonderful time on your cruise!  We are doing to do a breath test to look for h pylori bacteria today.  While this is pending we will start you on omeprazole for one month- take this daily Please let me know how this does as far as your cough and reflux symptoms. Please stop by the imaging department on your way out today and schedule a mammogram  I am glad to do your pap at your convenience!

## 2017-10-31 ENCOUNTER — Encounter: Payer: Self-pay | Admitting: Family Medicine

## 2017-10-31 LAB — H. PYLORI BREATH TEST: H. pylori Breath Test: DETECTED — AB

## 2017-10-31 LAB — HEMOGLOBIN A1C: HEMOGLOBIN A1C: 5.9 % (ref 4.6–6.5)

## 2017-10-31 MED ORDER — CLARITHROMYCIN 500 MG PO TABS: 500.0000 mg | ORAL_TABLET | Freq: Two times a day (BID) | ORAL | 0 refills | Status: DC

## 2017-10-31 MED ORDER — AMOXICILLIN 500 MG PO CAPS: 1000.0000 mg | ORAL_CAPSULE | Freq: Two times a day (BID) | ORAL | 0 refills | Status: DC

## 2017-10-31 NOTE — Addendum Note (Signed)
Addended by: Lamar Blinks C on: 10/31/2017 05:46 PM   Modules accepted: Orders

## 2017-11-07 DIAGNOSIS — F99 Mental disorder, not otherwise specified: Secondary | ICD-10-CM | POA: Diagnosis not present

## 2017-11-26 NOTE — Progress Notes (Signed)
[place] at [facility] [place], [place], [place] [address] [phone]  Date:  11/29/2017   Name:  Claudia Gregory   DOB:  1960/06/23   MRN:  [id number]  PCP:  Darreld Mclean, MD    Chief Complaint: Visit for Pap Smear   History of Present Illness:  Claudia Gregory is a 57 y.o. very pleasant female patient who presents with the following:  Short term follow-up visit today Last seen here 7/29- Here today with concern of reflux and her feet burning History of anxiety, neuropathy of feet, deafness, GERD, hyperlipidemia  Pt seen today with the help of a sign interpreter Her feet seem to be better- she got a new job and is doing less standing so this is better  She notes that she is having a lot of coughing after she eats.  She is ok as long as she does not eat She feels "like I'm in a fog," she has noted this since she started menopause  She last had a bleed 1-2 years ago- however she feels like her fatigue is getting worse She does feels like something might be getting stuck in her throat No regurg of food She has not necessarily noted any acidic taste in her mouth No vomiting Her appetite is about the same  She has tried zantac but it did not help that much     Wt Readings from Last 3 Encounters:  10/30/17 186 lb (84.4 kg)  06/19/17 183 lb 6.4 oz (83.2 kg)  04/10/17 184 lb 6.4 oz (83.6 kg)   prilosec reisperdal Prinzide for BP zoloft Pravachol/////////////////////////////////////////////////// Her h pylori was positive at last visit and we treated her with an appropriate regimen Here today to recheck and do pap Last pap she thinks 2 years ago at [facility] Never had an abnormal pap that se can recall   She continues to have a cough mostly just after eating - it is a bit better since we treated her for H pylori She wold like to see GI-  I will arrange this for her She had a colon with Dr. Carlean Purl in 2016; however she  would like to be seen here in [place] for convenience so I will place this referral   Patient Active Problem List   Diagnosis Date Noted  . Generalized anxiety disorder 12/28/2015  . Elevated liver enzymes 12/28/2015  . Hyperglycemia 12/28/2015  . Plantar fasciitis of right foot 04/01/2015  . Neuropathic pain of both feet 10/27/2014  . Hx of adenomatous polyp of colon 08/08/2014  . Deafness 08/04/2014  . GERD (gastroesophageal reflux disease) 05/13/2014  . Hyperlipidemia 02/05/2013  . Hot flashes 11/24/2012  . OBESITY 10/23/2008  . Hypertension 10/23/2008  . BIPOLAR DISORDER UNSPECIFIED 09/02/2008    Past Medical History:  Diagnosis Date  . Anxiety   . Bipolar 1 disorder ([place])   . Carpal tunnel syndrome of left wrist   . Chest pain, atypical   . Deaf   . Depression   . FATIGUE   . Gallstones   . GERD (gastroesophageal reflux disease)   . Hx of adenomatous polyp of colon 08/08/2014  . Hypertension   . OBESITY   . Pneumonia     Past Surgical History:  Procedure Laterality Date  . CHOLECYSTECTOMY  2000  . COLONOSCOPY  2016   5 mm adenoma  . TUBAL LIGATION  1998    Social History   Tobacco Use  .  Smoking status: Never Smoker  . Smokeless tobacco: Never Used  Substance Use Topics  . Alcohol use: Yes    Alcohol/week: 2.0 standard drinks    Types: 2 Glasses of wine per week    Comment: rare glass of wine   . Drug use: No    Family History  Problem Relation Age of Onset  . Brain cancer Mother   . Hypertension Mother   . Diabetes Mother   . Diabetes Maternal Aunt   . Heart attack Maternal Grandmother   . Hypertension Maternal Grandmother   . Colon cancer Neg Hx   . Rectal cancer Neg Hx   . Stomach cancer Neg Hx     No Known Allergies  Medication list has been reviewed and updated.  Current Outpatient Medications on File Prior to Visit  Medication Sig Dispense Refill  . albuterol (PROVENTIL HFA;VENTOLIN HFA) 108 (90 Base) MCG/ACT inhaler Inhale 2 puffs into  the lungs every 6 (six) hours as needed for wheezing or shortness of breath. 1 Inhaler 0  . clarithromycin (BIAXIN) 500 MG tablet Take 1 tablet (500 mg total) by mouth 2 (two) times daily. Take for 2 weeks 28 tablet 0  . lisinopril-hydrochlorothiazide (PRINZIDE,ZESTORETIC) 20-25 MG tablet Take 1 tablet by mouth daily. 90 tablet 1  . omeprazole (PRILOSEC) 40 MG capsule TAKE 1 CAPSULE(40 MG) BY MOUTH DAILY 90 capsule 1  . pravastatin (PRAVACHOL) 20 MG tablet Take 1 tablet (20 mg total) by mouth daily. 90 tablet 3  . risperiDONE (RISPERDAL) 0.5 MG tablet Take 0.5 mg by mouth daily.    . sertraline (ZOLOFT) 100 MG tablet Take 100 mg by mouth daily.     No current facility-administered medications on file prior to visit.     Review of Systems:  As per HPI- otherwise negative. No fever or chills No vomiting   Physical Examination: Vitals:   11/29/17 1425  BP: 128/80  Pulse: 68  Resp: 16  SpO2: 98%   Vitals:   11/29/17 1425  Weight: 191 lb (86.6 kg)  Height: 5\' 4"  (1.626 m)   Body mass index is 32.79 kg/m. Ideal Body Weight: Weight in (lb) to have BMI = 25: 145.3  GEN: WDWN, NAD, Non-toxic, A & O x 3, obese, looks well  HEENT: Atraumatic, Normocephalic. Neck supple. No masses, No LAD. Ears and Nose: No external deformity. CV: RRR, No M/G/R. No JVD. No thrill. No extra heart sounds. PULM: CTA B, no wheezes, crackles, rhonchi. No retractions. No resp. distress. No accessory muscle use. ABD: S, NT, ND EXTR: No c/c/e NEURO Normal gait.  PSYCH: Normally interactive. Conversant. Not depressed or anxious appearing.  Calm demeanor.  Pelvic: normal, no vaginal lesions or discharge. Uterus normal, no CMT, no adnexal tendereness or masses   Assessment and Plan: Screening for cervical cancer - Plan: Cytology - PAP  Coughing - Plan: Ambulatory referral to Gastroenterology  Pap pending, will contact pt with results Persistent cough just after eating ? Esophageal spasm or  reflux Referral to GI for their opinion   Signed Lamar Blinks, MD

## 2017-11-29 ENCOUNTER — Ambulatory Visit: Payer: Medicare PPO | Admitting: Family Medicine

## 2017-11-29 ENCOUNTER — Encounter: Payer: Self-pay | Admitting: Family Medicine

## 2017-11-29 ENCOUNTER — Other Ambulatory Visit (HOSPITAL_COMMUNITY)
Admission: RE | Admit: 2017-11-29 | Discharge: 2017-11-29 | Disposition: A | Discharge status: Home / Self Care | Payer: Medicare PPO | Source: Ambulatory Visit | Attending: Family Medicine | Admitting: Family Medicine

## 2017-11-29 ENCOUNTER — Ambulatory Visit (HOSPITAL_BASED_OUTPATIENT_CLINIC_OR_DEPARTMENT_OTHER)
Admission: RE | Admit: 2017-11-29 | Discharge: 2017-11-29 | Disposition: A | Discharge status: Home / Self Care | Payer: Medicare PPO | Source: Ambulatory Visit | Attending: Family Medicine | Admitting: Family Medicine

## 2017-11-29 VITALS — BP 128/80 | HR 68 | Resp 16 | Ht 64.0 in | Wt 191.0 lb

## 2017-11-29 DIAGNOSIS — R059 Cough, unspecified: Secondary | ICD-10-CM

## 2017-11-29 DIAGNOSIS — Z1231 Encounter for screening mammogram for malignant neoplasm of breast: Secondary | ICD-10-CM | POA: Insufficient documentation

## 2017-11-29 DIAGNOSIS — Z1239 Encounter for other screening for malignant neoplasm of breast: Secondary | ICD-10-CM

## 2017-11-29 DIAGNOSIS — Z124 Encounter for screening for malignant neoplasm of cervix: Secondary | ICD-10-CM | POA: Diagnosis not present

## 2017-11-29 DIAGNOSIS — R05 Cough: Secondary | ICD-10-CM | POA: Diagnosis not present

## 2017-11-29 NOTE — Patient Instructions (Signed)
Good to see you today!  I will be in touch with your pap asap over mychart We will have you see Gi to discuss other possible causes of your cough after eating.   Take care and let me know if any changes or other concerns

## 2017-11-30 ENCOUNTER — Ambulatory Visit: Payer: Medicare PPO | Admitting: Family Medicine

## 2017-12-01 ENCOUNTER — Encounter: Payer: Self-pay | Admitting: Family Medicine

## 2017-12-01 LAB — CYTOLOGY - PAP
Diagnosis: NEGATIVE
HPV: NOT DETECTED

## 2017-12-14 ENCOUNTER — Encounter: Payer: Self-pay | Admitting: Family Medicine

## 2017-12-18 ENCOUNTER — Other Ambulatory Visit: Payer: Self-pay | Admitting: Family Medicine

## 2017-12-18 DIAGNOSIS — I10 Essential (primary) hypertension: Secondary | ICD-10-CM

## 2017-12-19 DIAGNOSIS — F99 Mental disorder, not otherwise specified: Secondary | ICD-10-CM | POA: Diagnosis not present

## 2018-01-08 DIAGNOSIS — F3181 Bipolar II disorder: Secondary | ICD-10-CM | POA: Diagnosis not present

## 2018-01-17 ENCOUNTER — Encounter: Payer: Self-pay | Admitting: Family Medicine

## 2018-01-23 ENCOUNTER — Ambulatory Visit: Payer: Medicare PPO | Admitting: Physician Assistant

## 2018-01-23 ENCOUNTER — Encounter: Payer: Self-pay | Admitting: Physician Assistant

## 2018-01-23 VITALS — BP 120/60 | HR 80 | Ht 64.0 in | Wt 192.0 lb

## 2018-01-23 DIAGNOSIS — R6889 Other general symptoms and signs: Secondary | ICD-10-CM | POA: Diagnosis not present

## 2018-01-23 DIAGNOSIS — R0989 Other specified symptoms and signs involving the circulatory and respiratory systems: Secondary | ICD-10-CM

## 2018-01-23 DIAGNOSIS — R059 Cough, unspecified: Secondary | ICD-10-CM

## 2018-01-23 DIAGNOSIS — R1310 Dysphagia, unspecified: Secondary | ICD-10-CM

## 2018-01-23 DIAGNOSIS — R05 Cough: Secondary | ICD-10-CM

## 2018-01-23 MED ORDER — PANTOPRAZOLE SODIUM 40 MG PO TBEC: DELAYED_RELEASE_TABLET | ORAL | 6 refills | Status: DC

## 2018-01-23 NOTE — Patient Instructions (Addendum)
You have been scheduled for an endoscopy. Please follow written instructions given to you at your visit today. If you use inhalers (even only as needed), please bring them with you on the day of your procedure.  We sent a prescription to your pharmacy, [place], [place].  1. Pantoprazole sodium 40 mg We have provided you with Anti-refulx regimen and diet handout.   Normal BMI (Body Mass Index- based on height and weight) is between 19 and 25. Your BMI today is Body mass index is 32.96 kg/m. Marland Kitchen Please consider follow up  regarding your BMI with your Primary Care Provider.

## 2018-01-23 NOTE — Progress Notes (Addendum)
Subjective:    Patient ID: Claudia Gregory, female    DOB: 26-Jun-1960, 57 y.o.   MRN: [id number]  HPI Claudia Gregory is a pleasant 57 year old white female known to Dr. Carlean Gregory who was last seen in the office in April 2017.  She is referred back today by Dr. Edilia Gregory with complaints of several month history of persistent nagging cough, constant sensation to clear her throat and vague dysphasia.  She was tried on a 1 month course of omeprazole which the patient said made no difference.  Interestingly she has no complaints of heartburn or indigestion.  She is not aware of any postnasal drainage.  Her cough is nonproductive.  Appetite has been fine, weight has been stable.  She says she is not bothered by any nighttime symptoms.  Usually symptoms start early in the morning and generally are worse immediately after eating.  On closer questioning she is able to swallow liquids without any difficulty but feels that with solid food, there is a sense of it sitting in the upper esophagus gradually going down.  She has not had any regurgitation.   She has history of deafness, GERD, hypertension, bipolar disorder generalized anxiety, history of adenomatous colon polyps and has had fatty liver on prior ultrasound 2017. Colonoscopy was done in May 2016 with finding of one 5 mm polyp which was removed and found to be a tubular adenoma, and also mild left colon diverticulosis.  She is indicated for 5-year interval follow-up.   She has not had prior EGD  Review of Systems Pertinent positive and negative review of systems were noted in the above HPI section.  All other review of systems was otherwise negative.  Outpatient Encounter Medications as of 01/23/2018  Medication Sig  . lisinopril-hydrochlorothiazide (PRINZIDE,ZESTORETIC) 20-25 MG tablet TAKE 1 TABLET BY MOUTH DAILY  . pravastatin (PRAVACHOL) 20 MG tablet Take 1 tablet (20 mg total) by mouth daily.  . sertraline (ZOLOFT) 100 MG tablet Take 100 mg by mouth daily.  .  pantoprazole (PROTONIX) 40 MG tablet Take  1 tablet with breakfast and dinner.  . [DISCONTINUED] albuterol (PROVENTIL HFA;VENTOLIN HFA) 108 (90 Base) MCG/ACT inhaler Inhale 2 puffs into the lungs every 6 (six) hours as needed for wheezing or shortness of breath.  . [DISCONTINUED] clarithromycin (BIAXIN) 500 MG tablet Take 1 tablet (500 mg total) by mouth 2 (two) times daily. Take for 2 weeks  . [DISCONTINUED] omeprazole (PRILOSEC) 40 MG capsule TAKE 1 CAPSULE(40 MG) BY MOUTH DAILY  . [DISCONTINUED] risperiDONE (RISPERDAL) 0.5 MG tablet Take 0.5 mg by mouth daily.   No facility-administered encounter medications on file as of 01/23/2018.    No Known Allergies Patient Active Problem List   Diagnosis Date Noted  . Generalized anxiety disorder 12/28/2015  . Elevated liver enzymes 12/28/2015  . Hyperglycemia 12/28/2015  . Plantar fasciitis of right foot 04/01/2015  . Neuropathic pain of both feet 10/27/2014  . Hx of adenomatous polyp of colon 08/08/2014  . Deafness 08/04/2014  . GERD (gastroesophageal reflux disease) 05/13/2014  . Hyperlipidemia 02/05/2013  . Hot flashes 11/24/2012  . OBESITY 10/23/2008  . Hypertension 10/23/2008  . BIPOLAR DISORDER UNSPECIFIED 09/02/2008   Social History   Socioeconomic History  . Marital status: Married    Spouse name: Not on file  . Number of children: 2  . Years of education: Not on file  . Highest education level: Not on file  Occupational History  . Occupation: Oceanographer  Social Needs  . Financial resource strain: Not  on file  . Food insecurity:    Worry: Not on file    Inability: Not on file  . Transportation needs:    Medical: Not on file    Non-medical: Not on file  Tobacco Use  . Smoking status: Never Smoker  . Smokeless tobacco: Never Used  Substance and Sexual Activity  . Alcohol use: Yes    Alcohol/week: 2.0 standard drinks    Types: 2 Glasses of wine per week    Comment: rare glass of wine   . Drug use: No  . Sexual  activity: Not on file  Lifestyle  . Physical activity:    Days per week: Not on file    Minutes per session: Not on file  . Stress: Not on file  Relationships  . Social connections:    Talks on phone: Not on file    Gets together: Not on file    Attends religious service: Not on file    Active member of club or organization: Not on file    Attends meetings of clubs or organizations: Not on file    Relationship status: Not on file  . Intimate partner violence:    Fear of current or ex partner: Not on file    Emotionally abused: Not on file    Physically abused: Not on file    Forced sexual activity: Not on file  Other Topics Concern  . Not on file  Social History Narrative   Married 2 sons she works Oceanographer at [employer] eats 2 caffeinated beverages daily  2 alcoholic drinks daily   10/27/2033           Claudia Gregory's family history includes Brain cancer in her mother; Diabetes in her maternal aunt and mother; Heart attack in her maternal grandmother; Hypertension in her maternal grandmother and mother.      Objective:    Vitals:   01/23/18 1440  BP: 120/60  Pulse: 80    Physical Exam; well-developed white female in no acute distress, blood pressure 120/60 pulse 80, BMI 32.9.  Patient is deaf and accompanied by a sign interpreter.  HEENT; nontraumatic normocephalic EOMI PERRLA sclera anicteric oral mucosa moist, Cardiovascular ;regular rate and rhythm with S1-S2 no murmur rub or gallop, Pulmonary; clear bilaterally, Abdomen; soft, nontender nondistended bowel sounds are active there is no palpable mass or hepatosplenomegaly.  Rectal; exam not done, Ext; no clubbing cyanosis or edema skin warm dry, Neuro psych;alert and oriented, grossly nonfocal mood and affect appropriate       Assessment & Plan:   #55 57 year old female with several month history of constant nonproductive cough, tickle in the back of her throat and constant need to clear her throat.  Cough is been  nonproductive.  She also has vague dysphasia symptoms, with sense that food is sitting in the upper esophagus transiently,no regurgitation,no symptoms of heartburn or indigestion.  I am not convinced that all of her symptoms are secondary to GERD or laryngeal pharyngeal reflux.  She will need further work-up  #2 deafness 3.  Bipolar disorder, generalized anxiety disorder 4.  Hypertension 5.  Adenomatous colon polyps-up-to-date with colonoscopy due for follow-up May 2021  Plan; Start anti-reflux regimen and diet, patient was given educational cereals. Start Protonix 40 mg p.o. twice daily at least short-term as generally higher doses of PPIs are needed for good control of LPR. I discussed option of Barium swallow versus proceeding directly to EGD with possible dilation if indicated.  She would like  to proceed with EGD, and this will be scheduled with Dr. Carlean Gregory.  Procedure was discussed in detail with patient including indications risks and benefits and she is agreeable to proceed.  We briefly discussed ENT referral depending on her response to PPI and results of the EGD.  Amy S Esterwood PA-C 01/23/2018   Cc: Copland, Gay Filler, MD  Agree with Ms. Genia Harold assessment and plan. Gatha Mayer, MD, [facility]

## 2018-02-01 ENCOUNTER — Encounter: Payer: Self-pay | Admitting: Internal Medicine

## 2018-02-01 ENCOUNTER — Encounter

## 2018-02-01 ENCOUNTER — Ambulatory Visit (AMBULATORY_SURGERY_CENTER): Payer: Medicare PPO | Admitting: Internal Medicine

## 2018-02-01 VITALS — BP 135/75 | HR 66 | Temp 98.2°F | Resp 14 | Ht 64.0 in | Wt 192.0 lb

## 2018-02-01 DIAGNOSIS — R1310 Dysphagia, unspecified: Secondary | ICD-10-CM | POA: Diagnosis not present

## 2018-02-01 DIAGNOSIS — R05 Cough: Secondary | ICD-10-CM

## 2018-02-01 DIAGNOSIS — R6889 Other general symptoms and signs: Secondary | ICD-10-CM | POA: Diagnosis not present

## 2018-02-01 DIAGNOSIS — R1319 Other dysphagia: Secondary | ICD-10-CM | POA: Diagnosis not present

## 2018-02-01 DIAGNOSIS — R059 Cough, unspecified: Secondary | ICD-10-CM

## 2018-02-01 DIAGNOSIS — R0989 Other specified symptoms and signs involving the circulatory and respiratory systems: Secondary | ICD-10-CM

## 2018-02-01 MED ORDER — SODIUM CHLORIDE 0.9 % IV SOLN: 500.0000 mL | Freq: Once | INTRAVENOUS | Status: DC

## 2018-02-01 NOTE — Progress Notes (Signed)
PT taken to PACU. Monitors in place. VSS. Report given to RN. 

## 2018-02-01 NOTE — Patient Instructions (Addendum)
The exam looks ok - no problems seen in the throat esophagus or duodenum.  The stomach looks a little red - but that is common and has nothing to do with your symptoms.  You need to return to Dr. Lorelei Pont and see about holding or stopping lisinopril (unless that has been done) as that is a cause of cough. I WILL MESSAGE HER ABOUT CHANGING THE MEDICATIONS.  Take delsym two tsp every 12 hours to suppress the urge to cough. Swallowing water and/or using ice chips/non mint and menthol containing candies (such as lifesavers or sugarless jolly ranchers) are also effective.  You should avoid activities that you know make you cough.  I appreciate the opportunity to care for you. Claudia Mayer, MD, FACG        YOU HAD AN ENDOSCOPIC PROCEDURE TODAY AT [facility]:   Refer to the procedure report that was given to you for any specific questions about what was found during the examination.  If the procedure report does not answer your questions, please call your gastroenterologist to clarify.  If you requested that your care partner not be given the details of your procedure findings, then the procedure report has been included in a sealed envelope for you to review at your convenience later.  YOU SHOULD EXPECT: Some feelings of bloating in the abdomen. Passage of more gas than usual.  Walking can help get rid of the air that was put into your GI tract during the procedure and reduce the bloating. If you had a lower endoscopy (such as a colonoscopy or flexible sigmoidoscopy) you may notice spotting of blood in your stool or on the toilet paper. If you underwent a bowel prep for your procedure, you may not have a normal bowel movement for a few days.  Please Note:  You might notice some irritation and congestion in your nose or some drainage.  This is from the oxygen used during your procedure.  There is no need for concern and it should clear up in a day or so.  SYMPTOMS TO  REPORT IMMEDIATELY:     Following upper endoscopy (EGD)  Vomiting of blood or coffee ground material  New chest pain or pain under the shoulder blades  Painful or persistently difficult swallowing  New shortness of breath  Fever of 100F or higher  Black, tarry-looking stools  For urgent or emergent issues, a gastroenterologist can be reached at any hour by calling [phone].   DIET:  We do recommend a small meal at first, but then you may proceed to your regular diet.  Drink plenty of fluids but you should avoid alcoholic beverages for 24 hours.  ACTIVITY:  You should plan to take it easy for the rest of today and you should NOT DRIVE or use heavy machinery until tomorrow (because of the sedation medicines used during the test).    FOLLOW UP: Our staff will call the number listed on your records the next business day following your procedure to check on you and address any questions or concerns that you may have regarding the information given to you following your procedure. If we do not reach you, we will leave a message.  However, if you are feeling well and you are not experiencing any problems, there is no need to return our call.  We will assume that you have returned to your regular daily activities without incident.  If any biopsies were taken you will be contacted by  phone or by letter within the next 1-3 weeks.  Please call us at [phone] if you have not heard about the biopsies in 3 weeks.    SIGNATURES/CONFIDENTIALITY: You and/or your care partner have signed paperwork which will be entered into your electronic medical record.  These signatures attest to the fact that that the information above on your After Visit Summary has been reviewed and is understood.  Full responsibility of the confidentiality of this discharge information lies with you and/or your care-partner.

## 2018-02-01 NOTE — Op Note (Signed)
[facility] Patient Name: Claudia Gregory Procedure Date: 02/01/2018 9:56 AM MRN: [id number] Endoscopist: Gatha Mayer , MD Age: 57 Referring MD:  Date of Birth: 1960-11-12 Gender: Female Account #: [id number] Procedure:                Upper GI endoscopy Indications:              Dysphagia, Chronic cough, Throat clearing Medicines:                Propofol per Anesthesia, Monitored Anesthesia Care Procedure:                Pre-Anesthesia Assessment:                           - Prior to the procedure, a History and Physical                            was performed, and patient medications and                            allergies were reviewed. The patient's tolerance of                            previous anesthesia was also reviewed. The risks                            and benefits of the procedure and the sedation                            options and risks were discussed with the patient.                            All questions were answered, and informed consent                            was obtained. Prior Anticoagulants: The patient has                            taken no previous anticoagulant or antiplatelet                            agents. ASA Grade Assessment: III - A patient with                            severe systemic disease. After reviewing the risks                            and benefits, the patient was deemed in                            satisfactory condition to undergo the procedure.                           After obtaining informed consent, the endoscope was  passed under direct vision. Throughout the                            procedure, the patient's blood pressure, pulse, and                            oxygen saturations were monitored continuously. The                            Model GIF-[id number]) scope was introduced                            through the mouth, and advanced to the second part                    of duodenum. The upper GI endoscopy was                            accomplished without difficulty. The patient                            tolerated the procedure well. Scope In: Scope Out: Findings:                 The hypopharynx was normal.                           The larynx was normal.                           The examined esophagus was normal.                           Patchy mildly erythematous mucosa without bleeding                            was found in the gastric antrum.                           The examined duodenum was normal. Complications:            No immediate complications. Estimated Blood Loss:     Estimated blood loss: none. Impression:               - Normal hypopharynx.                           - Normal larynx.                           - Normal esophagus.                           - Erythematous mucosa in the antrum.                           - Normal examined duodenum.                           -  No specimens collected. Recommendation:           - Patient has a contact number available for                            emergencies. The signs and symptoms of potential                            delayed complications were discussed with the                            patient. Return to normal activities tomorrow.                            Written discharge instructions were provided to the                            patient.                           - Resume previous diet.                           - Continue present medications.                           - SHE DENIED DYSPHAGIA TODAY - SHE NEEDS TO SEE PCP                            AGAIN AND CONSIDER CHANGING FROM LISINOPRIL WHICH                            MAY BE CAUSING COUGH AND UPPER AIRWAY SYMPTOMS                           IF SHE HAS PERSISTENT PROBLEMS WITH THESE SYMPTOMS                            WOLD LIKELY DO A MODIFIED BARIUM SWALLOW AND                            CONSIDER ENT  EVAL                           I THOUGHT PHARYNGEAL AREAS AND LARYNX WERE NORMAL                            BUT NOT MY EXPERTISE Gatha Mayer, MD 02/01/2018 10:17:24 AM This report has been signed electronically.

## 2018-02-02 ENCOUNTER — Telehealth: Payer: Self-pay | Admitting: *Deleted

## 2018-02-02 ENCOUNTER — Other Ambulatory Visit: Payer: Self-pay | Admitting: Family Medicine

## 2018-02-02 ENCOUNTER — Encounter: Payer: Self-pay | Admitting: Family Medicine

## 2018-02-02 MED ORDER — LOSARTAN POTASSIUM-HCTZ 50-12.5 MG PO TABS: 1.0000 | ORAL_TABLET | Freq: Every day | ORAL | 6 refills | Status: DC

## 2018-02-02 NOTE — Telephone Encounter (Signed)
  Follow up Call-  Call back number 02/01/2018  Post procedure Call Back phone  # [phone]  Permission to leave phone message Yes  Some recent data might be hidden     Patient questions:  Do you have a fever, pain , or abdominal swelling? No. Pain Score  0 *  Have you tolerated food without any problems? Yes.    Have you been able to return to your normal activities? Yes.    Do you have any questions about your discharge instructions: Diet   No. Medications  Yes.   Follow up visit  Yes.    Do you have questions or concerns about your Care? No.  Actions: * If pain score is 4 or above: No action needed, pain <4.  Pt. Unsure about whether to continue meds.  I went over DR. Gessner's plan with her regarding discussing lisinopril with pcp.  She will do this and get back in touch with Dr. Carlean Purl if symptoms persist.

## 2018-03-07 ENCOUNTER — Encounter: Payer: Self-pay | Admitting: Family Medicine

## 2018-03-07 DIAGNOSIS — R059 Cough, unspecified: Secondary | ICD-10-CM

## 2018-03-07 DIAGNOSIS — R05 Cough: Secondary | ICD-10-CM

## 2018-03-20 DIAGNOSIS — F3181 Bipolar II disorder: Secondary | ICD-10-CM | POA: Diagnosis not present

## 2018-03-29 ENCOUNTER — Encounter: Payer: Self-pay | Admitting: Family Medicine

## 2018-03-29 ENCOUNTER — Ambulatory Visit: Payer: Medicare PPO | Admitting: Family Medicine

## 2018-03-29 VITALS — BP 126/72 | HR 80 | Temp 98.9°F | Resp 16 | Ht 64.0 in | Wt 190.0 lb

## 2018-03-29 DIAGNOSIS — J069 Acute upper respiratory infection, unspecified: Secondary | ICD-10-CM | POA: Diagnosis not present

## 2018-03-29 DIAGNOSIS — B9789 Other viral agents as the cause of diseases classified elsewhere: Secondary | ICD-10-CM

## 2018-03-29 DIAGNOSIS — S46811A Strain of other muscles, fascia and tendons at shoulder and upper arm level, right arm, initial encounter: Secondary | ICD-10-CM

## 2018-03-29 LAB — POC INFLUENZA A&B (BINAX/QUICKVUE)
Influenza A, POC: NEGATIVE
Influenza B, POC: NEGATIVE

## 2018-03-29 MED ORDER — NAPROXEN 500 MG PO TBEC: 500.0000 mg | DELAYED_RELEASE_TABLET | Freq: Two times a day (BID) | ORAL | 0 refills | Status: DC

## 2018-03-29 MED ORDER — BENZONATATE 100 MG PO CAPS: 100.0000 mg | ORAL_CAPSULE | Freq: Three times a day (TID) | ORAL | 0 refills | Status: DC | PRN

## 2018-03-29 MED ORDER — METHYLPREDNISOLONE ACETATE 80 MG/ML IJ SUSP
80.0000 mg | Freq: Once | INTRAMUSCULAR | Status: AC
  Administered 2018-03-29: 80 mg via INTRAMUSCULAR

## 2018-03-29 NOTE — Addendum Note (Signed)
Addended by: Wynonia Musty A on: 03/29/2018 04:42 PM   Modules accepted: Orders

## 2018-03-29 NOTE — Progress Notes (Signed)
Chief Complaint  Patient presents with  . Cough    fever, chills, sweating, headache, congestion    [name] here for URI complaints. Here w aid of ASL interpreter.  Duration: 3 days  Associated symptoms: subjective fever, sinus congestion, rhinorrhea, wheezing, shortness of breath and cough Denies: sinus pain, itchy watery eyes, ear pain, ear drainage and sore throat Treatment to date: OTC cold med Sick contacts: No   R shoulder/neck pain for 2 mo. No inj or change in activity. Has been using heat to no avail.   ROS:  Const: +chills HEENT: As noted in HPI Lungs: +SOB  Past Medical History:  Diagnosis Date  . Anxiety   . Bipolar 1 disorder ([place])   . Carpal tunnel syndrome of left wrist   . Chest pain, atypical   . Deaf   . Depression   . FATIGUE   . Gallstones   . GERD (gastroesophageal reflux disease)   . Hx of adenomatous polyp of colon 08/08/2014  . Hypertension   . OBESITY   . Pneumonia     BP 126/72 (BP Location: Left Arm, Patient Position: Sitting, Cuff Size: Normal)   Pulse 80   Temp 98.9 F (37.2 C) (Oral)   Resp 16   Ht 5\' 4"  (1.626 m)   Wt 190 lb (86.2 kg)   LMP 10/06/2015 (Approximate)   SpO2 94%   BMI 32.61 kg/m  General: Awake, alert, appears stated age 57: AT, [place], ears patent b/l and TM's neg, nares patent w/o discharge, tho there is dried blood around nares, no sinus ttp, pharynx pink and without exudates, MMM Neck: No masses or asymmetry Heart: RRR Lungs: CTAB, no accessory muscle use MSK: TTP over R trap Neuro: DTR's equal and symm in UE's Psych: Age appropriate judgment and insight, normal mood and affect  Viral URI with cough - Plan: POC Influenza A&B (Binax test), benzonatate (TESSALON) 100 MG capsule  Strain of right trapezius muscle, initial encounter - Plan: naproxen (NAPROXEN DR) 500 MG EC tablet  Orders as above. Depo 80 IM.  Continue to push fluids, practice good hand hygiene, cover mouth when  coughing. Stretches/exercises for trap, heat.  F/u prn. If starting to experience fevers, shaking, or shortness of breath, seek immediate care. Pt voiced understanding and agreement to the plan.  [place], DO 03/29/18 4:27 PM

## 2018-03-29 NOTE — Patient Instructions (Signed)
Continue to push fluids, practice good hand hygiene, and cover your mouth if you cough.  If you start having fevers, shaking or shortness of breath, seek immediate care.  For symptoms, consider using Vick's VapoRub on chest or under nose, air humidifier, Benadryl at night, and elevating the head of the bed. Tylenol and ibuprofen for aches and pains you may be experiencing.   Heat (pad or rice pillow in microwave) over affected area, 10-15 minutes twice daily.   Trapezius stretches/exercises Do exercises exactly as told by your health care provider and adjust them as directed. It is normal to feel mild stretching, pulling, tightness, or discomfort as you do these exercises, but you should stop right away if you feel sudden pain or your pain gets worse.  Stretching and range of motion exercises These exercises warm up your muscles and joints and improve the movement and flexibility of your shoulder. These exercises can also help to relieve pain, numbness, and tingling. If you are unable to do any of the following for any reason, do not further attempt to do it.   Exercise A: Flexion, standing    1. Stand and hold a broomstick, a cane, or a similar object. Place your hands a little more than shoulder-width apart on the object. Your left / right hand should be palm-up, and your other hand should be palm-down. 2. Push the stick to raise your left / right arm out to your side and then over your head. Use your other hand to help move the stick. Stop when you feel a stretch in your shoulder, or when you reach the angle that is recommended by your health care provider. ? Avoid shrugging your shoulder while you raise your arm. Keep your shoulder blade tucked down toward your spine. 3. Hold for 30 seconds. 4. Slowly return to the starting position. Repeat 2 times. Complete this exercise 3 times per week.  Exercise B: Abduction, supine    1. Lie on your back and hold a broomstick, a cane, or a  similar object. Place your hands a little more than shoulder-width apart on the object. Your left / right hand should be palm-up, and your other hand should be palm-down. 2. Push the stick to raise your left / right arm out to your side and then over your head. Use your other hand to help move the stick. Stop when you feel a stretch in your shoulder, or when you reach the angle that is recommended by your health care provider. ? Avoid shrugging your shoulder while you raise your arm. Keep your shoulder blade tucked down toward your spine. 3. Hold for 30 seconds. 4. Slowly return to the starting position. Repeat 2 times. Complete this exercise 3 times per week.  Exercise C: Flexion, active-assisted    1. Lie on your back. You may bend your knees for comfort. 2. Hold a broomstick, a cane, or a similar object. Place your hands about shoulder-width apart on the object. Your palms should face toward your feet. 3. Raise the stick and move your arms over your head and behind your head, toward the floor. Use your healthy arm to help your left / right arm move farther. Stop when you feel a gentle stretch in your shoulder, or when you reach the angle where your health care provider tells you to stop. 4. Hold for 30 seconds. 5. Slowly return to the starting position. Repeat 2 times. Complete this exercise 3 times per week.  Exercise D: External rotation  and abduction    1. Stand in a door frame with one of your feet slightly in front of the other. This is called a staggered stance. 2. Choose one of the following positions as told by your health care provider: ? Place your hands and forearms on the door frame above your head. ? Place your hands and forearms on the door frame at the height of your head. ? Place your hands on the door frame at the height of your elbows. 3. Slowly move your weight onto your front foot until you feel a stretch across your chest and in the front of your shoulders. Keep  your head and chest upright and keep your abdominal muscles tight. 4. Hold for 30 seconds. 5. To release the stretch, shift your weight to your back foot. Repeat 2 times. Complete this stretch 3 times per week.  Strengthening exercises These exercises build strength and endurance in your shoulder. Endurance is the ability to use your muscles for a long time, even after your muscles get tired. Exercise E: Scapular depression and adduction  1. Sit on a stable chair. Support your arms in front of you with pillows, armrests, or a tabletop. Keep your elbows in line with the sides of your body. 2. Gently move your shoulder blades down toward your middle back. Relax the muscles on the tops of your shoulders and in the back of your neck. 3. Hold for 3 seconds. 4. Slowly release the tension and relax your muscles completely before doing this exercise again. Repeat for a total of 10 repetitions. 5. After you have practiced this exercise, try doing the exercise without the arm support. Then, try the exercise while standing instead of sitting. Repeat 2 times. Complete this exercise 3 times per week.  Exercise F: Shoulder abduction, isometric    1. Stand or sit about 4-6 inches (10-15 cm) from a wall with your left / right side facing the wall. 2. Bend your left / right elbow and gently press your elbow against the wall. 3. Increase the pressure slowly until you are pressing as hard as you can without shrugging your shoulder. 4. Hold for 3 seconds. 5. Slowly release the tension and relax your muscles completely. Repeat for a total of 10 repetitions. Repeat 2 times. Complete this exercise 3 times per week.  Exercise G: Shoulder flexion, isometric    1. Stand or sit about 4-6 inches (10-15 cm) away from a wall with your left / right side facing the wall. 2. Keep your left / right elbow straight and gently press the top of your fist against the wall. Increase the pressure slowly until you are pressing  as hard as you can without shrugging your shoulder. 3. Hold for 10-15 seconds. 4. Slowly release the tension and relax your muscles completely. Repeat for a total of 10 repetitions. Repeat 2 times. Complete this exercise 3 times per week.  Exercise H: Internal rotation    1. Sit in a stable chair without armrests, or stand. Secure an exercise band at your left / right side, at elbow height. 2. Place a soft object, such as a folded towel or a small pillow, under your left / right upper arm so your elbow is a few inches (about 8 cm) away from your side. 3. Hold the end of the exercise band so the band stretches. 4. Keeping your elbow pressed against the soft object under your arm, move your forearm across your body toward your abdomen. Keep your  body steady so the movement is only coming from your shoulder. 5. Hold for 3 seconds. 6. Slowly return to the starting position. Repeat for a total of 10 repetitions. Repeat 2 times. Complete this exercise 3 times per week.  Exercise I: External rotation    1. Sit in a stable chair without armrests, or stand. 2. Secure an exercise band at your left / right side, at elbow height. 3. Place a soft object, such as a folded towel or a small pillow, under your left / right upper arm so your elbow is a few inches (about 8 cm) away from your side. 4. Hold the end of the exercise band so the band stretches. 5. Keeping your elbow pressed against the soft object under your arm, move your forearm out, away from your abdomen. Keep your body steady so the movement is only coming from your shoulder. 6. Hold for 3 seconds. 7. Slowly return to the starting position. Repeat for a total of 10 repetitions. Repeat 2 times. Complete this exercise 3 times per week. Exercise J: Shoulder extension  1. Sit in a stable chair without armrests, or stand. Secure an exercise band to a stable object in front of you so the band is at shoulder height. 2. Hold one end of the  exercise band in each hand. Your palms should face each other. 3. Straighten your elbows and lift your hands up to shoulder height. 4. Step back, away from the secured end of the exercise band, until the band stretches. 5. Squeeze your shoulder blades together and pull your hands down to the sides of your thighs. Stop when your hands are straight down by your sides. Do not let your hands go behind your body. 6. Hold for 3 seconds. 7. Slowly return to the starting position. Repeat for a total of 10 repetitions. Repeat 2 times. Complete this exercise 3 times per week.  Exercise K: Shoulder extension, prone    1. Lie on your abdomen on a firm surface so your left / right arm hangs over the edge. 2. Hold a 5 lb weight in your hand so your palm faces in toward your body. Your arm should be straight. 3. Squeeze your shoulder blade down toward the middle of your back. 4. Slowly raise your arm behind you, up to the height of the surface that you are lying on. Keep your arm straight. 5. Hold for 3 seconds. 6. Slowly return to the starting position and relax your muscles. Repeat for a total of 10 repetitions. Repeat 2 times. Complete this exercise 3 times per week.   Exercise L: Horizontal abduction, prone  1. Lie on your abdomen on a firm surface so your left / right arm hangs over the edge. 2. Hold a 5 lb weight in your hand so your palm faces toward your feet. Your arm should be straight. 3. Squeeze your shoulder blade down toward the middle of your back. 4. Bend your elbow so your hand moves up, until your elbow is bent to an "L" shape (90 degrees). With your elbow bent, slowly move your forearm forward and up. Raise your hand up to the height of the surface that you are lying on. ? Your upper arm should not move, and your elbow should stay bent. ? At the top of the movement, your palm should face the floor. 5. Hold for 3 seconds. 6. Slowly return to the starting position and relax your  muscles. Repeat for a total of 10 repetitions.  Repeat 2 times. Complete this exercise 3 times per week.  Exercise M: Horizontal abduction, standing  1. Sit on a stable chair, or stand. 2. Secure an exercise band to a stable object in front of you so the band is at shoulder height. 3. Hold one end of the exercise band in each hand. 4. Straighten your elbows and lift your hands straight in front of you, up to shoulder height. Your palms should face down, toward the floor. 5. Step back, away from the secured end of the exercise band, until the band stretches. 6. Move your arms out to your sides, and keep your arms straight. 7. Hold for 3 seconds. 8. Slowly return to the starting position. Repeat for a total of 10 repetitions. Repeat 2 times. Complete this exercise 3 times per week.  Exercise N: Scapular retraction and elevation  1. Sit on a stable chair, or stand. 2. Secure an exercise band to a stable object in front of you so the band is at shoulder height. 3. Hold one end of the exercise band in each hand. Your palms should face each other. 4. Sit in a stable chair without armrests, or stand. 5. Step back, away from the secured end of the exercise band, until the band stretches. 6. Squeeze your shoulder blades together and lift your hands over your head. Keep your elbows straight. 7. Hold for 3 seconds. 8. Slowly return to the starting position. Repeat for a total of 10 repetitions. Repeat 2 times. Complete this exercise 3 times per week.  This information is not intended to replace advice given to you by your health care provider. Make sure you discuss any questions you have with your health care provider. Document Released: 03/21/2005 Document Revised: 11/26/2015 Document Reviewed: 02/05/2015 Elsevier Interactive Patient Education  2017 [employer].

## 2018-04-10 ENCOUNTER — Ambulatory Visit (INDEPENDENT_AMBULATORY_CARE_PROVIDER_SITE_OTHER): Payer: Medicare PPO | Admitting: Pulmonary Disease

## 2018-04-10 ENCOUNTER — Ambulatory Visit (HOSPITAL_BASED_OUTPATIENT_CLINIC_OR_DEPARTMENT_OTHER)
Admission: RE | Admit: 2018-04-10 | Discharge: 2018-04-10 | Disposition: A | Discharge status: Home / Self Care | Payer: Medicare PPO | Source: Ambulatory Visit | Attending: Pulmonary Disease | Admitting: Pulmonary Disease

## 2018-04-10 ENCOUNTER — Encounter: Payer: Self-pay | Admitting: Pulmonary Disease

## 2018-04-10 VITALS — BP 108/60 | HR 69 | Ht 64.0 in | Wt 192.0 lb

## 2018-04-10 DIAGNOSIS — R059 Cough, unspecified: Secondary | ICD-10-CM

## 2018-04-10 DIAGNOSIS — R0989 Other specified symptoms and signs involving the circulatory and respiratory systems: Secondary | ICD-10-CM | POA: Diagnosis not present

## 2018-04-10 DIAGNOSIS — R05 Cough: Secondary | ICD-10-CM | POA: Diagnosis not present

## 2018-04-10 DIAGNOSIS — R0602 Shortness of breath: Secondary | ICD-10-CM | POA: Diagnosis not present

## 2018-04-10 DIAGNOSIS — T7840XA Allergy, unspecified, initial encounter: Secondary | ICD-10-CM

## 2018-04-10 MED ORDER — BENZONATATE 200 MG PO CAPS: 200.0000 mg | ORAL_CAPSULE | Freq: Three times a day (TID) | ORAL | 1 refills | Status: DC | PRN

## 2018-04-10 NOTE — Progress Notes (Signed)
Synopsis: Referred in January 2020 for cough; she had been on lisinopril through November 2019, had a normal upper endoscopy in 01/2018  Subjective:   PATIENT ID: Claudia Gregory GENDER: female DOB: 08/26/1960, MRN: [id number]   HPI  Chief Complaint  Patient presents with  . Consult    Referred by Dr. Edilia Bo for cough X6 mos.     Cough: > has been going on since September, no illness > happens after she eats > feels like she has food get stuck in her chest > she denies chest pressure or pain > she recently had a breahting treatment with Dr. Edilia Bo and she felt better > she has associated wheezing with the cough and with exertion. 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  > she wheezes when she exerts herself.  She did just buy a humidifier a few days ago and this helps with her cough.    No childhood respiratory illnesses, never smoked.  Past Medical History:  Diagnosis Date  . Anxiety   . Bipolar 1 disorder ([place])   . Carpal tunnel syndrome of left wrist   . Chest pain, atypical   . Deaf   . Depression   . FATIGUE   . Gallstones   . GERD (gastroesophageal reflux disease)   . Hx of adenomatous polyp of colon 08/08/2014  . Hypertension   . OBESITY   . Pneumonia      Family History  Problem Relation Age of Onset  . Brain cancer Mother   . Hypertension Mother   . Diabetes Mother   . Diabetes Maternal Aunt   . Heart attack Maternal Grandmother   . Hypertension Maternal Grandmother   . Colon cancer Neg Hx   . Rectal cancer Neg Hx   . Stomach cancer Neg Hx      Social History   Socioeconomic History  . Marital status: Married    Spouse name: Not on file   . Number of children: 2  . Years of education: Not on file  . Highest education level: Not on file  Occupational History  . Occupation: Oceanographer  Social Needs  . Financial resource strain: Not on file  . Food insecurity:    Worry: Not on file    Inability: Not on file  . Transportation needs:    Medical: Not on file    Non-medical: Not on file  Tobacco Use  . Smoking status: Never Smoker  . Smokeless tobacco: Never Used  Substance and Sexual Activity  . Alcohol use: Yes    Alcohol/week: 2.0 standard drinks    Types: 2 Glasses of wine per week    Comment: rare glass of wine   . Drug use: No  . Sexual activity: Not on file  Lifestyle  . Physical activity:    Days per week: Not on file    Minutes per session: Not on file  . Stress: Not on file  Relationships  . Social connections:    Talks on phone: Not on file    Gets together: Not on file    Attends religious service: Not on file  Active member of club or organization: Not on file    Attends meetings of clubs or organizations: Not on file    Relationship status: Not on file  . Intimate partner violence:    Fear of current or ex partner: Not on file    Emotionally abused: Not on file    Physically abused: Not on file    Forced sexual activity: Not on file  Other Topics Concern  . Not on file  Social History Narrative   Married 2 sons she works Oceanographer at [employer] eats 2 caffeinated beverages daily  2 alcoholic drinks daily   04/09/2692            No Known Allergies   Outpatient Medications Prior to Visit  Medication Sig Dispense Refill  . losartan-hydrochlorothiazide (HYZAAR) 50-12.5 MG tablet Take 1 tablet by mouth daily. 30 tablet 6  . pravastatin (PRAVACHOL) 20 MG tablet Take 1 tablet (20 mg total) by mouth daily. 90 tablet 3  . sertraline (ZOLOFT) 100 MG tablet Take 100 mg by mouth daily.    . benzonatate (TESSALON) 100 MG capsule Take 1 capsule (100 mg total) by mouth 3 (three) times daily as  needed. (Patient not taking: Reported on 04/10/2018) 30 capsule 0  . naproxen (NAPROXEN DR) 500 MG EC tablet Take 1 tablet (500 mg total) by mouth 2 (two) times daily with a meal. (Patient not taking: Reported on 04/10/2018) 30 tablet 0   No facility-administered medications prior to visit.     Review of Systems  Constitutional: Negative for fever and weight loss.  HENT: Positive for congestion and sore throat. Negative for ear pain and nosebleeds.   Eyes: Negative for redness.  Respiratory: Positive for cough, shortness of breath and wheezing.   Cardiovascular: Positive for PND. Negative for palpitations and leg swelling.  Gastrointestinal: Negative for nausea and vomiting.  Genitourinary: Negative for dysuria.  Skin: Negative for rash.  Neurological: Negative for headaches.  Endo/Heme/Allergies: Does not bruise/bleed easily.  Psychiatric/Behavioral: Negative for depression. The patient is not nervous/anxious.       Objective:  Physical Exam   Vitals:   04/10/18 1518  BP: 108/60  Pulse: 69  SpO2: 96%  Weight: 192 lb (87.1 kg)  Height: 5\' 4"  (1.626 m)    RA  Gen: well appearing, no acute distress, frequent coughing HENT: NCAT, OP clear, neck supple without masses Eyes: PERRL, EOMi Lymph: no cervical lymphadenopathy PULM: CTA B CV: RRR, no mgr, no JVD GI: BS+, soft, nontender, no hsm Derm: no rash or skin breakdown MSK: normal bulk and tone Neuro: Deaf, A&Ox4, CN II-XII intact, strength 5/5 in all 4 extremities Psyche: normal mood and affect   CBC    Component Value Date/Time   WBC 6.5 04/10/2017 1010   RBC 4.41 04/10/2017 1010   HGB 13.2 04/10/2017 1010   HCT 38.9 04/10/2017 1010   PLT 230.0 04/10/2017 1010   MCV 88.2 04/10/2017 1010   MCH 29.8 10/27/2014 1612   MCHC 33.8 04/10/2017 1010   RDW 12.6 04/10/2017 1010   LYMPHSABS 1.7 07/30/2015 0950   MONOABS 0.5 07/30/2015 0950   EOSABS 0.1 07/30/2015 0950   BASOSABS 0.0 07/30/2015 0950     Chest  imaging: 06/2017 CXR showed normal pulmonary parenchyma  PFT:  Labs:  Path:  Echo:  Heart Catheterization:  Endoscopy performed on February 01, 2018 showed no evidence of esophageal or gastric abnormality, recommendation was made to stop lisinopril     Assessment & Plan:  Cough - Plan: DG Chest 2 View  SOB (shortness of breath) - Plan: Pulmonary function test  Allergic disorder, initial encounter  Discussion: Claudia Gregory is here to see me for cough.  I think this started with lisinopril and has led to persistent laryngeal irritation from ongoing coughing.  It is exacerbated by allergic rhinitis and postnasal drip.  I think the only way she is going to make improvement is to have the allergic rhinitis treated and then make strong effort to suppress the cough for a few weeks to allow the laryngeal irritation to heal up.  Some people call this cyclical cough because the more people cough, the stronger the cough signal in the brain becomes.  I also worry about the possibility of asthma though her lungs are clear today and most symptoms sound as if they are upper airway in origin.  Plan: Allergic rhinitis: Use Neil Med rinses with distilled water at least twice per day using the instructions on the package. 1/2 hour after using the [facility] Med rinse, use Nasacort two puffs in each nostril once per day.  Remember that the Nasacort can take 1-2 weeks to work after regular use. Use generic zyrtec (cetirizine) every day.  If this doesn't help, then stop taking it and use chlorpheniramine-phenylephrine combination tablets.  Cough: We will check a chest x-ray Three weeks after taking the allergic rhinitis medicines above: You need to try to suppress your cough to allow your larynx (voice box) to heal.  For three days don't talk, laugh, sing, or clear your throat. Do everything you can to suppress the cough during this time. Use hard candies (sugarless Jolly Ranchers) or non-mint or non-menthol  containing cough drops during this time to soothe your throat.  Use a cough suppressant (the Tessalon I prescribed you) around the clock during this time.  After three days, gradually increase the use of your voice and back off on the cough suppressants.  Shortness of breath: We will check a chest x-ray to see if there is evidence of asthma We will check a lung function test We will check a test called and exhaled nitric oxide test to look for evidence of asthma  We will see you back in 4 to 6 weeks or sooner if needed     Current Outpatient Medications:  .  losartan-hydrochlorothiazide (HYZAAR) 50-12.5 MG tablet, Take 1 tablet by mouth daily., Disp: 30 tablet, Rfl: 6 .  pravastatin (PRAVACHOL) 20 MG tablet, Take 1 tablet (20 mg total) by mouth daily., Disp: 90 tablet, Rfl: 3 .  sertraline (ZOLOFT) 100 MG tablet, Take 100 mg by mouth daily., Disp: , Rfl:  .  benzonatate (TESSALON) 200 MG capsule, Take 1 capsule (200 mg total) by mouth 3 (three) times daily as needed for cough., Disp: 45 capsule, Rfl: 1

## 2018-04-10 NOTE — Patient Instructions (Signed)
Allergic rhinitis: Use Neil Med rinses with distilled water at least twice per day using the instructions on the package. 1/2 hour after using the [facility] Med rinse, use Nasacort two puffs in each nostril once per day.  Remember that the Nasacort can take 1-2 weeks to work after regular use. Use generic zyrtec (cetirizine) every day.  If this doesn't help, then stop taking it and use chlorpheniramine-phenylephrine combination tablets.  Cough: We will check a chest x-ray Three weeks after taking the allergic rhinitis medicines above: You need to try to suppress your cough to allow your larynx (voice box) to heal.  For three days don't talk, laugh, sing, or clear your throat. Do everything you can to suppress the cough during this time. Use hard candies (sugarless Jolly Ranchers) or non-mint or non-menthol containing cough drops during this time to soothe your throat.  Use a cough suppressant (the Tessalon I prescribed you) around the clock during this time.  After three days, gradually increase the use of your voice and back off on the cough suppressants.  Shortness of breath: We will check a chest x-ray to see if there is evidence of asthma We will check a lung function test We will check a test called and exhaled nitric oxide test to look for evidence of asthma  We will see you back in 4 to 6 weeks or sooner if needed

## 2018-04-11 ENCOUNTER — Telehealth: Payer: Self-pay | Admitting: Pulmonary Disease

## 2018-04-11 NOTE — Telephone Encounter (Signed)
Called and spoke with patient via interpreter line, she is aware of results and verbalized understanding. Nothing further needed.

## 2018-04-12 DIAGNOSIS — F3181 Bipolar II disorder: Secondary | ICD-10-CM | POA: Diagnosis not present

## 2018-04-13 ENCOUNTER — Ambulatory Visit: Payer: Medicare PPO

## 2018-04-17 ENCOUNTER — Ambulatory Visit (INDEPENDENT_AMBULATORY_CARE_PROVIDER_SITE_OTHER): Payer: Medicare PPO | Admitting: *Deleted

## 2018-04-17 ENCOUNTER — Ambulatory Visit (INDEPENDENT_AMBULATORY_CARE_PROVIDER_SITE_OTHER): Payer: Medicare PPO | Admitting: Pulmonary Disease

## 2018-04-17 DIAGNOSIS — R0602 Shortness of breath: Secondary | ICD-10-CM

## 2018-04-17 LAB — PULMONARY FUNCTION TEST
DL/VA % pred: 88 %
DL/VA: 4.54 ml/min/mmHg/L
DLCO unc % pred: 59 %
DLCO unc: 16.84 ml/min/mmHg
FEF 25-75 PRE: 3.73 L/s
FEF 25-75 Post: 4.13 L/sec
FEF2575-%Change-Post: 10 %
FEF2575-%Pred-Post: 150 %
FEF2575-%Pred-Pre: 136 %
FEV1-%Change-Post: 1 %
FEV1-%Pred-Post: 90 %
FEV1-%Pred-Pre: 88 %
FEV1-Post: 2.68 L
FEV1-Pre: 2.64 L
FEV1FVC-%Change-Post: 2 %
FEV1FVC-%Pred-Pre: 110 %
FEV6-%Change-Post: 0 %
FEV6-%PRED-PRE: 81 %
FEV6-%Pred-Post: 80 %
FEV6-Post: 2.98 L
FEV6-Pre: 3 L
FEV6FVC-%Pred-Post: 103 %
FEV6FVC-%Pred-Pre: 103 %
FVC-%Change-Post: 0 %
FVC-%Pred-Post: 78 %
FVC-%Pred-Pre: 78 %
FVC-Post: 2.98 L
FVC-Pre: 3 L
Post FEV1/FVC ratio: 90 %
Post FEV6/FVC ratio: 100 %
Pre FEV1/FVC ratio: 88 %
Pre FEV6/FVC Ratio: 100 %
RV % pred: 119 %
RV: 2.44 L
TLC % pred: 93 %
TLC: 5.13 L

## 2018-04-17 LAB — POCT EXHALED NITRIC OXIDE: FeNO level (ppb): 6

## 2018-04-17 LAB — NITRIC OXIDE: FVC: 6 L

## 2018-04-17 NOTE — Progress Notes (Signed)
Exhaled Nitric Oxide (feno) result- 6

## 2018-04-17 NOTE — Progress Notes (Signed)
PFT done today. 

## 2018-04-25 ENCOUNTER — Other Ambulatory Visit: Payer: Self-pay | Admitting: Family Medicine

## 2018-04-25 DIAGNOSIS — E785 Hyperlipidemia, unspecified: Secondary | ICD-10-CM

## 2018-05-17 DIAGNOSIS — F3181 Bipolar II disorder: Secondary | ICD-10-CM | POA: Diagnosis not present

## 2018-05-22 DIAGNOSIS — F331 Major depressive disorder, recurrent, moderate: Secondary | ICD-10-CM | POA: Diagnosis not present

## 2018-05-29 ENCOUNTER — Ambulatory Visit: Payer: Medicare PPO | Admitting: Pulmonary Disease

## 2018-09-14 ENCOUNTER — Other Ambulatory Visit: Payer: Self-pay | Admitting: Family Medicine

## 2018-09-18 ENCOUNTER — Other Ambulatory Visit: Payer: Self-pay

## 2018-10-18 ENCOUNTER — Encounter: Payer: Self-pay | Admitting: Family Medicine

## 2018-10-18 ENCOUNTER — Telehealth: Payer: Self-pay

## 2018-10-18 DIAGNOSIS — R7303 Prediabetes: Secondary | ICD-10-CM

## 2018-10-18 NOTE — Telephone Encounter (Signed)
Copied from [place] [id number]. Topic: Appointment Scheduling - Scheduling Inquiry for Clinic >> Oct 18, 2018  9:24 AM Richardo Priest, NT wrote: Reason for CRM: Patient calling in, with sign language interpreter on line, asking for lab orders to be in to check her A1C. Call back is [phone] and can leave a detailed message or message on mychart.

## 2018-11-09 ENCOUNTER — Ambulatory Visit: Payer: Medicare PPO | Admitting: Family Medicine

## 2018-11-13 ENCOUNTER — Other Ambulatory Visit: Payer: Self-pay

## 2018-11-14 NOTE — Progress Notes (Signed)
[facility] [place], [place], [place] [address] [phone]  Date:  11/15/2018   Name:  Claudia Gregory   DOB:  1961/02/03   MRN:  [id number]  PCP:  Darreld Mclean, MD    Chief Complaint: Eczema (bilateral hands, worse on left, 3w eeks, creams not working)   History of Present Illness:  Claudia Gregory is a 58 y.o. 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  She is really not sure.  She notes that her hands do get hot and sweaty while wearing the gloves.  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 Generalized anxiety disorder 12/28/2015  . Elevated liver enzymes 12/28/2015  . Hyperglycemia 12/28/2015  . Plantar fasciitis of right foot 04/01/2015  . Neuropathic pain of both feet 10/27/2014  . Hx of adenomatous polyp of colon 08/08/2014  . Deafness 08/04/2014  . GERD (gastroesophageal reflux disease) 05/13/2014  . Hyperlipidemia 02/05/2013  . Hot flashes 11/24/2012  . OBESITY 10/23/2008  . Hypertension 10/23/2008  . BIPOLAR DISORDER UNSPECIFIED 09/02/2008    Past Medical History:  Diagnosis Date  . Anxiety   . Bipolar 1 disorder ([place])   . Carpal  tunnel syndrome of left wrist   . Chest pain, atypical   . Deaf   . Depression   . FATIGUE   . Gallstones   . GERD (gastroesophageal reflux disease)   . Hx of adenomatous polyp of colon 08/08/2014  . Hypertension   . OBESITY   . Pneumonia     Past Surgical History:  Procedure Laterality Date  . CHOLECYSTECTOMY  2000  . COLONOSCOPY  2016   5 mm adenoma  . TUBAL LIGATION  1998    Social History   Tobacco Use  . Smoking status: Never Smoker  . Smokeless tobacco: Never Used  Substance Use Topics  . Alcohol use: Yes    Alcohol/week: 2.0 standard drinks    Types: 2 Glasses of wine per week    Comment: rare glass of wine   . Drug use: No    Family History  Problem Relation Age of Onset  . Brain cancer Mother   . Hypertension Mother   . Diabetes Mother   . Diabetes Maternal Aunt   . Heart attack Maternal Grandmother   . Hypertension Maternal Grandmother   . Colon cancer Neg Hx   . Rectal cancer Neg Hx   . Stomach cancer Neg Hx     No Known Allergies  Medication list has been reviewed and updated.  Current Outpatient Medications on File Prior to  Visit  Medication Sig Dispense Refill  . losartan-hydrochlorothiazide (HYZAAR) 50-12.5 MG tablet TAKE 1 TABLET BY MOUTH DAILY 30 tablet 2  . pravastatin (PRAVACHOL) 20 MG tablet TAKE 1 TABLET(20 MG) BY MOUTH DAILY 90 tablet 3  . sertraline (ZOLOFT) 100 MG tablet Take 100 mg by mouth daily.     No current facility-administered medications on file prior to visit.     Review of Systems:  As per HPI- otherwise negative. No fever or chills, no chest pain or shortness of breath  Physical Examination: Vitals:   11/15/18 1433  BP: 126/80  Pulse: 67  Resp: 16  Temp: (!) 97.4 F (36.3 C)  SpO2: 97%   Vitals:   11/15/18 1433  Weight: 197 lb (89.4 kg)  Height: 5\' 4"  (1.626 m)   Body mass index is 33.81 kg/m. Ideal Body Weight: Weight in (lb) to have BMI = 25: 145.3  GEN: WDWN, NAD, Non-toxic, A & O x 3, obese,  looks well HEENT: Atraumatic, Normocephalic. Neck supple. No masses, No LAD. Ears and Nose: No external deformity. CV: RRR, No M/G/R. No JVD. No thrill. No extra heart sounds. PULM: CTA B, no wheezes, crackles, rhonchi. No retractions. No resp. distress. No accessory muscle use. EXTR: No c/c/e NEURO Normal gait.  PSYCH: Normally interactive. Conversant. Not depressed or anxious appearing.  Calm demeanor.  Her left palm and hand displays widespread evidence of dyshidrotic eczema.  The skin is quite dry and scaly.  She has some involvement of the right hand, but not nearly as severe  Assessment and Plan:   ICD-10-CM   1. Dyshidrotic eczema  L30.1 fluticasone (CUTIVATE) 0.05 % cream   Here today with dyshidrotic eczema of her palms.  Discussed this in detail with patient through sign language interpreter.  Encouraged her to keep the area well moisturized with an emollient such as Vaseline.  Also prescribed a higher potency steroid to use twice a day.  Encouraged her to keep her hands dry and out of water as much as possible. Asked her to let me know if not significantly improved within about a week  Follow-up: No follow-ups on file.  Meds ordered this encounter  Medications  . fluticasone (CUTIVATE) 0.05 % cream    Sig: Apply topically 2 (two) times daily.    Dispense:  60 g    Refill:  1   No orders of the defined types were placed in this encounter.   @SIGN @    Signed Lamar Blinks, MD

## 2018-11-15 ENCOUNTER — Ambulatory Visit: Payer: Medicare PPO | Admitting: Family Medicine

## 2018-11-15 ENCOUNTER — Other Ambulatory Visit: Payer: Self-pay

## 2018-11-15 ENCOUNTER — Encounter: Payer: Self-pay | Admitting: Family Medicine

## 2018-11-15 VITALS — BP 126/80 | HR 67 | Temp 97.4°F | Resp 16 | Ht 64.0 in | Wt 197.0 lb

## 2018-11-15 DIAGNOSIS — L301 Dyshidrosis [pompholyx]: Secondary | ICD-10-CM

## 2018-11-15 MED ORDER — FLUTICASONE PROPIONATE 0.05 % EX CREA: TOPICAL_CREAM | Freq: Two times a day (BID) | CUTANEOUS | 1 refills | Status: DC

## 2018-11-15 NOTE — Patient Instructions (Addendum)
It was a pleasure to see you today as always You appear to have dyshidrotic eczema- this is a type of itchy, very dry skin that will appear on the hands and fingers Exposure to latex gloves and having sweaty hands (from wearing these gloves) may be contributing Please see if you can try non -latex gloves at work  Use the steroid cream twice a day Keep the skin moist with vaseline or other thick moisturizer Wearing gloves over vaseline at night may help Try to otherwise keep your hands out of water and dry them promptly if sweaty or wet   Please let me know if not significantly better in a week or so- Sooner if worse.

## 2018-12-08 ENCOUNTER — Encounter (HOSPITAL_BASED_OUTPATIENT_CLINIC_OR_DEPARTMENT_OTHER): Payer: Self-pay | Admitting: Emergency Medicine

## 2018-12-08 ENCOUNTER — Other Ambulatory Visit: Payer: Self-pay

## 2018-12-08 ENCOUNTER — Emergency Department (HOSPITAL_BASED_OUTPATIENT_CLINIC_OR_DEPARTMENT_OTHER)
Admission: EM | Admit: 2018-12-08 | Discharge: 2018-12-08 | Disposition: A | Discharge status: Home / Self Care | Payer: Medicare PPO | Attending: Emergency Medicine | Admitting: Emergency Medicine

## 2018-12-08 ENCOUNTER — Emergency Department (HOSPITAL_BASED_OUTPATIENT_CLINIC_OR_DEPARTMENT_OTHER): Payer: Medicare PPO

## 2018-12-08 DIAGNOSIS — Z79899 Other long term (current) drug therapy: Secondary | ICD-10-CM | POA: Insufficient documentation

## 2018-12-08 DIAGNOSIS — I10 Essential (primary) hypertension: Secondary | ICD-10-CM | POA: Insufficient documentation

## 2018-12-08 DIAGNOSIS — R21 Rash and other nonspecific skin eruption: Secondary | ICD-10-CM | POA: Diagnosis not present

## 2018-12-08 DIAGNOSIS — R1310 Dysphagia, unspecified: Secondary | ICD-10-CM | POA: Diagnosis not present

## 2018-12-08 MED ORDER — PANTOPRAZOLE SODIUM 20 MG PO TBEC: 20.0000 mg | DELAYED_RELEASE_TABLET | Freq: Every day | ORAL | 1 refills | Status: DC

## 2018-12-08 MED ORDER — FLUTICASONE PROPIONATE 0.05 % EX CREA: TOPICAL_CREAM | Freq: Two times a day (BID) | CUTANEOUS | 0 refills | Status: DC

## 2018-12-08 MED ORDER — PANTOPRAZOLE SODIUM 40 MG PO TBEC
DELAYED_RELEASE_TABLET | ORAL | Status: AC
  Administered 2018-12-08: 21:00:00 40 mg
  Filled 2018-12-08: qty 1

## 2018-12-08 MED ORDER — PANTOPRAZOLE SODIUM 20 MG PO TBEC
20.0000 mg | DELAYED_RELEASE_TABLET | Freq: Once | ORAL | Status: DC
  Filled 2018-12-08: qty 1

## 2018-12-08 NOTE — Discharge Instructions (Signed)
1.  Start taking Protonix daily.  Follow instructions for gastric reflux disease. 2.  You may need a swallowing study.  This needs to be scheduled by your doctor. 3.  Follow-up with your doctor regarding the ongoing rash on your hands.

## 2018-12-08 NOTE — ED Provider Notes (Signed)
[facility] EMERGENCY DEPARTMENT Provider Note   CSN: [id number] Arrival date & time: 12/08/18  1947     History   Chief Complaint Chief Complaint  Patient presents with  . Dysphagia  . Rash    HPI Claudia Gregory is a 58 y.o. female.     HPI Patient presents with 2 chief complaints. 1 chief complaint is difficulty with swallowing and eating.  This has been going on for a year.  She reports also times when she eats she feels like she is getting choked her food is catching.  She does a lot of throat clearing and coughing.  She reports it seems to be getting worse.  She denies that she has any trouble with liquids.  Reports she can drink without choking or difficulty.  Reports she had pizza today and it seemed harder to swallow.  She makes a lot of throat clearing sounds but that is been chronic.  Patient reports that she has reflux but denies that she has been treated for it.  Reports she is not doing any type of medication for GERD and she is sleeping flat.  Patient reports she does get burning sensation in her epigastrium and feels like things come up in her chest.  Patient also reports that she is having problems with a rash on her hands.  Is been going on for several months.  Her doctor is treating with Cutivate and she is stopped wearing gloves at work but is still is red and itchy.  Reports it is somewhat improved but she is almost out of the steroid cream.  No other rashes. Past Medical History:  Diagnosis Date  . Anxiety   . Bipolar 1 disorder ([place])   . Carpal tunnel syndrome of left wrist   . Chest pain, atypical   . Deaf   . Depression   . FATIGUE   . Gallstones   . GERD (gastroesophageal reflux disease)   . Hx of adenomatous polyp of colon 08/08/2014  . Hypertension   . OBESITY   . Pneumonia     Patient Active Problem List   Diagnosis Date Noted  . Generalized anxiety disorder 12/28/2015  . Elevated liver enzymes 12/28/2015  . Hyperglycemia 12/28/2015  .  Plantar fasciitis of right foot 04/01/2015  . Neuropathic pain of both feet 10/27/2014  . Hx of adenomatous polyp of colon 08/08/2014  . Deafness 08/04/2014  . GERD (gastroesophageal reflux disease) 05/13/2014  . Hyperlipidemia 02/05/2013  . Hot flashes 11/24/2012  . OBESITY 10/23/2008  . Hypertension 10/23/2008  . BIPOLAR DISORDER UNSPECIFIED 09/02/2008    Past Surgical History:  Procedure Laterality Date  . CHOLECYSTECTOMY  2000  . COLONOSCOPY  2016   5 mm adenoma  . TUBAL LIGATION  1998     OB History   No obstetric history on file.      Home Medications    Prior to Admission medications   Medication Sig Start Date End Date Taking? Authorizing Provider  fluticasone (CUTIVATE) 0.05 % cream Apply topically 2 (two) times daily. 11/15/18  Yes Copland, Gay Filler, MD  losartan-hydrochlorothiazide (HYZAAR) 50-12.5 MG tablet TAKE 1 TABLET BY MOUTH DAILY 09/18/18  Yes Copland, Gay Filler, MD  pravastatin (PRAVACHOL) 20 MG tablet TAKE 1 TABLET(20 MG) BY MOUTH DAILY 04/25/18  Yes Copland, Gay Filler, MD  sertraline (ZOLOFT) 100 MG tablet Take 100 mg by mouth daily.   Yes [provider]    Family History Family History  Problem Relation Age of  Onset  . Brain cancer Mother   . Hypertension Mother   . Diabetes Mother   . Diabetes Maternal Aunt   . Heart attack Maternal Grandmother   . Hypertension Maternal Grandmother   . Colon cancer Neg Hx   . Rectal cancer Neg Hx   . Stomach cancer Neg Hx     Social History Social History   Tobacco Use  . Smoking status: Never Smoker  . Smokeless tobacco: Never Used  Substance Use Topics  . Alcohol use: Yes    Alcohol/week: 2.0 standard drinks    Types: 2 Glasses of wine per week    Comment: rare glass of wine   . Drug use: No     Allergies   Patient has no known allergies.   Review of Systems Review of Systems 10 Systems reviewed and are negative for acute change except as noted in the HPI.   Physical Exam  Updated Vital Signs BP (!) 142/76 (BP Location: Right Arm)   Pulse 67   Temp 98.2 F (36.8 C) (Oral)   Resp 20   Ht 5\' 6"  (1.676 m)   Wt 86.2 kg   LMP 10/06/2015 (Approximate)   SpO2 98%   BMI 30.67 kg/m   Physical Exam Constitutional:      Appearance: Normal appearance. She is well-developed.  HENT:     Head: Normocephalic and atraumatic.     Mouth/Throat:     Mouth: Mucous membranes are moist.     Pharynx: Oropharynx is clear.  Eyes:     Extraocular Movements: Extraocular movements intact.  Neck:     Musculoskeletal: Neck supple.  Cardiovascular:     Rate and Rhythm: Normal rate and regular rhythm.     Heart sounds: Normal heart sounds.  Pulmonary:     Effort: Pulmonary effort is normal.     Breath sounds: Normal breath sounds.  Abdominal:     General: Bowel sounds are normal. There is no distension.     Palpations: Abdomen is soft.     Tenderness: There is no abdominal tenderness.  Musculoskeletal: Normal range of motion.  Skin:    General: Skin is warm and dry.     Comments: Some shiny erythema and scaling of both palms.  No vesicular lesions.  Eczematous appearance.  Neurological:     Mental Status: She is alert and oriented to person, place, and time.     GCS: GCS eye subscore is 4. GCS verbal subscore is 5. GCS motor subscore is 6.     Coordination: Coordination normal.      ED Treatments / Results  Labs (all labs ordered are listed, but only abnormal results are displayed) Labs Reviewed - No data to display  EKG None  Radiology Dg Chest 2 View  Result Date: 12/08/2018 CLINICAL DATA:  Dysphagia EXAM: CHEST - 2 VIEW COMPARISON:  April 10, 2018 FINDINGS: The heart size and mediastinal contours are within normal limits. Both lungs are clear. The visualized skeletal structures are unremarkable. IMPRESSION: No active cardiopulmonary disease. Electronically Signed   By: Prudencio Pair M.D.   On: 12/08/2018 20:51    Procedures Procedures (including  critical care time)  Medications Ordered in ED Medications - No data to display   Initial Impression / Assessment and Plan / ED Course  I have reviewed the triage vital signs and the nursing notes.  Pertinent labs & imaging results that were available during my care of the patient were reviewed by me and considered in  my medical decision making (see chart for details).       Patient has been having over a years worth of feeling of difficulty swallowing and coughing with eating.  She has frequent throat clearing.  She however reports she has no difficulty swallowing liquids and has eaten pizza today.  I do not suspect food impaction.  There is no spitting her airway is clear.  Patient will need follow-up swallowing study.  She however describes fairly longstanding GERD but denies she is taking any PPI or antacid.  Will start the patient on Protonix and have counseled on GERD.  She is stable to follow-up with her PCP for assessment of response to treatment and additional outpatient diagnostic studies.  We will extend her Cutivate treatment for what appears eczematous hand rash.  May need dermatology follow-up.  Final Clinical Impressions(s) / ED Diagnoses   Final diagnoses:  Dysphagia, unspecified type  Rash    ED Discharge Orders         Ordered    pantoprazole (PROTONIX) 20 MG tablet  Daily     12/08/18 2132    fluticasone (CUTIVATE) 0.05 % cream  2 times daily     12/08/18 2132           Charlesetta Shanks, MD 12/09/18 1340

## 2018-12-08 NOTE — ED Triage Notes (Addendum)
Pt deaf, ASL interpreter required. Used stratus video for interpretation.   Reports dysphagia x1 year. States it makes it hard for her to breath, getting worse lately. States went out to eat and son noticed swallowing was bad. Coughing/clearing the throat "non stop." States makes her fatigued. Reports she was followed by GI for same, never got the chance to have EGD d/t COVID. Also reporting rash to L hand for 3 weeks, seen by PCP for same. States that she was given a steroid for same.

## 2018-12-18 DIAGNOSIS — F331 Major depressive disorder, recurrent, moderate: Secondary | ICD-10-CM | POA: Diagnosis not present

## 2018-12-27 ENCOUNTER — Other Ambulatory Visit: Payer: Self-pay | Admitting: Family Medicine

## 2019-01-15 DIAGNOSIS — F319 Bipolar disorder, unspecified: Secondary | ICD-10-CM | POA: Diagnosis not present

## 2019-01-23 DIAGNOSIS — F319 Bipolar disorder, unspecified: Secondary | ICD-10-CM | POA: Diagnosis not present

## 2019-02-06 DIAGNOSIS — F319 Bipolar disorder, unspecified: Secondary | ICD-10-CM | POA: Diagnosis not present

## 2019-02-12 NOTE — Progress Notes (Addendum)
[place] [facility] [place], [place], [place] [address] [phone]  Date:  02/13/2019   Name:  Claudia Gregory   DOB:  [birth year]   MRN:  [id number]  PCP:  Darreld Mclean, MD    Chief Complaint: Back Pain (getting hot flashes, back pain in left side yesterday, urinary frequency, no nausea or vomiting)   History of Present Illness:  Claudia Gregory is a 58 y.o. very pleasant female patient who presents with the following:  Patient with history of hypertension, prediabetes, elevated liver enzymes, peripheral neuropathy, dyslipidemia, deafness. Here today with concern of back pain  Seen today with aid of ASL interpreter  Last seen by myself in August of this year with concern of dyshidrotic eczema She has noted some pain in her left side and feeling hot, fatigued-she feels like she is having hot flashes She describes pain in her left shoulder and upper mostly when she first wakes up in the morning. She has noted this for 2-3 weeks She has noted urinary frequency as well No hematuria, No dysuria  She is more stressed out recently as her son is back living with them She feels a strange feeling in her belly, like there is a bump there  No vomiting No fevers noted No significant cough or ST  No CP with activity or exercise  Flu vaccine- done already  Colonoscopy due next year, reminded pt today  Mammogram 1 year ago - [facility] Can suggest Shingrix Due for routine labs- ok to do today   LMP was at age 13- no PMB  She did a cardiac stress test in 2013 which was normal-no known history of CAD She does not smoke, no drugs and rarely uses alcohol   Patient Active Problem List   Diagnosis Date Noted  . Generalized anxiety disorder 12/28/2015  . Elevated liver enzymes 12/28/2015  . Hyperglycemia 12/28/2015  . Plantar fasciitis of right foot 04/01/2015  . Neuropathic pain of both feet 10/27/2014   . Hx of adenomatous polyp of colon 08/08/2014  . Deafness 08/04/2014  . GERD (gastroesophageal reflux disease) 05/13/2014  . Hyperlipidemia 02/05/2013  . Hot flashes 11/24/2012  . OBESITY 10/23/2008  . Hypertension 10/23/2008  . BIPOLAR DISORDER UNSPECIFIED 09/02/2008    Past Medical History:  Diagnosis Date  . Anxiety   . Bipolar 1 disorder ([place])   . Carpal tunnel syndrome of left wrist   . Chest pain, atypical   . Deaf   . Depression   . FATIGUE   . Gallstones   . GERD (gastroesophageal reflux disease)   . Hx of adenomatous polyp of colon 08/08/2014  . Hypertension   . OBESITY   . Pneumonia     Past Surgical History:  Procedure Laterality Date  . CHOLECYSTECTOMY  2000  . COLONOSCOPY  2016   5 mm adenoma  . TUBAL LIGATION  1998    Social History   Tobacco Use  . Smoking status: Never Smoker  . Smokeless tobacco: Never Used  Substance Use Topics  . Alcohol use: Yes    Alcohol/week: 2.0 standard drinks    Types: 2 Glasses of wine per week    Comment: rare glass of wine   . Drug use: No    Family History  Problem Relation Age of Onset  . Brain cancer Mother   . Hypertension Mother   . Diabetes Mother   . Diabetes Maternal Aunt   .  Heart attack Maternal Grandmother   . Hypertension Maternal Grandmother   . Colon cancer Neg Hx   . Rectal cancer Neg Hx   . Stomach cancer Neg Hx     No Known Allergies  Medication list has been reviewed and updated.  Current Outpatient Medications on File Prior to Visit  Medication Sig Dispense Refill  . fluticasone (CUTIVATE) 0.05 % cream Apply topically 2 (two) times daily. 60 g 1  . fluticasone (CUTIVATE) 0.05 % cream Apply topically 2 (two) times daily. 30 g 0  . losartan-hydrochlorothiazide (HYZAAR) 50-12.5 MG tablet TAKE 1 TABLET BY MOUTH DAILY 30 tablet 2  . pantoprazole (PROTONIX) 20 MG tablet Take 1 tablet (20 mg total) by mouth daily. 30 tablet 1  . pravastatin (PRAVACHOL) 20 MG tablet TAKE 1 TABLET(20 MG) BY  MOUTH DAILY 90 tablet 3  . sertraline (ZOLOFT) 100 MG tablet Take 100 mg by mouth daily.     No current facility-administered medications on file prior to visit.     Review of Systems:  As per HPI- otherwise negative. No fever or chills No exertional chest pain or shortness of breath She also denies chest pain at rest, but notes that her shoulder can be painful, her chest may feel nonspecifically uncomfortable at times  Physical Examination: Vitals:   02/13/19 0949  BP: 140/80  Pulse: 72  Resp: 16  Temp: (!) 97.1 F (36.2 C)  SpO2: 97%   Vitals:   02/13/19 0949  Weight: 193 lb (87.5 kg)  Height: 5\' 6"  (1.676 m)   Body mass index is 31.15 kg/m. Ideal Body Weight: Weight in (lb) to have BMI = 25: 154.6  GEN: WDWN, NAD, Non-toxic, A & O x 3, obese, looks well Patient is deaf HEENT: Atraumatic, Normocephalic. Neck supple. No masses, No LAD.  Bilateral TM wnl, oropharynx normal.  PEERL,EOMI. wears left-sided hearing aid Ears and Nose: No external deformity. CV: RRR, No M/G/R. No JVD. No thrill. No extra heart sounds. PULM: CTA B, no wheezes, crackles, rhonchi. No retractions. No resp. distress. No accessory muscle use. ABD: S, NT, ND, +BS. No rebound. No HSM.  Benign abdomen EXTR: No c/c/e NEURO Normal gait.  PSYCH: Normally interactive. Conversant. Not depressed or anxious appearing.  Calm demeanor.  Reproducible tenderness is present in her left upper back, along the scapula.  She also has some discomfort with manipulation of the left shoulder joint, though she is not tender over the rotator cuff insertion.  Range of motion of her arm is normal.  She also notes discomfort in the paraspinous muscles in her neck  EKG: NSR, no ST elevation or depression C/w tracing 10/2016-  No major change noted  Assessment and Plan: Pre-diabetes - Plan: Hemoglobin A1c  Essential hypertension - Plan: CBC, Comprehensive metabolic panel  Dyslipidemia - Plan: Lipid panel  Screening for  thyroid disorder - Plan: CANCELED: TSH  Urinary frequency - Plan: Urine Culture, POCT Urinalysis Dipstick (Automated)  Encounter for screening mammogram for malignant neoplasm of breast - Plan: MM 3D SCREEN BREAST BILATERAL  Acute pain of left shoulder - Plan: EKG 12-Lead, Troponin I (High Sensitivity), DG Chest 2 View, DG Cervical Spine Complete, cyclobenzaprine (FLEXERIL) 10 MG tablet  Hot flashes - Plan: TSH, FSH  Here today with concerns. We will catch up on routine labs today, ordered mammogram She notes nonspecific symptoms of feeling hot and waking up with left shoulder pain for the last 2 or 3 weeks.  This is likely a musculoskeletal issue, but  will do more evaluation to rule out heart or lung pathology.  Will obtain plain films of her chest today.  EKG is normal, order troponin Her symptoms could possibly be due to a pinched nerve in her neck, will obtain neck films today as well Prescription for Flexeril to use as needed for pain, do not use when driving Results for orders placed or performed in visit on 02/13/19  Urine Culture   Specimen: Blood  Result Value Ref Range   MICRO NUMBER: [id number]    SPECIMEN QUALITY: Adequate    Sample Source NOT GIVEN    STATUS: FINAL    ISOLATE 1:      Growth of mixed flora was isolated, suggesting probable contamination. No further testing will be performed. If clinically indicated, recollection using a method to minimize contamination, with prompt transfer to Urine Culture Transport Tube, is  recommended.   CBC  Result Value Ref Range   WBC 7.1 4.0 - 10.5 K/uL   RBC 4.66 3.87 - 5.11 Mil/uL   Platelets 212.0 150.0 - 400.0 K/uL   Hemoglobin 13.8 12.0 - 15.0 g/dL   HCT 41.1 36.0 - 46.0 %   MCV 88.1 78.0 - 100.0 fl   MCHC 33.6 30.0 - 36.0 g/dL   RDW 12.7 11.5 - 15.5 %  Comprehensive metabolic panel  Result Value Ref Range   Sodium 137 135 - 145 mEq/L   Potassium 4.4 3.5 - 5.1 mEq/L   Chloride 100 96 - 112 mEq/L   CO2 30 19 - 32 mEq/L    Glucose, Bld 241 (H) 70 - 99 mg/dL   BUN 12 6 - 23 mg/dL   Creatinine, Ser 0.62 0.40 - 1.20 mg/dL   Total Bilirubin 0.3 0.2 - 1.2 mg/dL   Alkaline Phosphatase 73 39 - 117 U/L   AST 42 (H) 0 - 37 U/L   ALT 68 (H) 0 - 35 U/L   Total Protein 7.4 6.0 - 8.3 g/dL   Albumin 4.5 3.5 - 5.2 g/dL   GFR 99.58 >60.00 mL/min   Calcium 10.0 8.4 - 10.5 mg/dL  Hemoglobin A1c  Result Value Ref Range   Hgb A1c MFr Bld 6.9 (H) 4.6 - 6.5 %  Lipid panel  Result Value Ref Range   Cholesterol 236 (H) 0 - 200 mg/dL   Triglycerides 234.0 (H) 0.0 - 149.0 mg/dL   HDL 59.10 >39.00 mg/dL   VLDL 46.8 (H) 0.0 - 40.0 mg/dL   Total CHOL/HDL Ratio 4    NonHDL 177.01   TSH  Result Value Ref Range   TSH 3.11 0.35 - 4.50 uIU/mL  FSH  Result Value Ref Range   FSH 44.2 mIU/ML  LDL cholesterol, direct  Result Value Ref Range   Direct LDL 152.0 mg/dL  POCT Urinalysis Dipstick (Automated)  Result Value Ref Range   Color, UA yellow    Clarity, UA clear    Glucose, UA Negative Negative   Bilirubin, UA negative    Ketones, UA negative    Spec Grav, UA 1.025 1.010 - 1.025   Blood, UA negative    pH, UA 6.0 5.0 - 8.0   Protein, UA Negative Negative   Urobilinogen, UA negative (A) 0.2 or 1.0 E.U./dL   Nitrite, UA 0.2    Leukocytes, UA Negative Negative  Troponin I (High Sensitivity)  Result Value Ref Range   High Sens Troponin I 5 2 - 17 ng/L   UA is negative, await urine culture Received her troponin which is negative  Received x-rays as follows, message to patient Dg Chest 2 View  Result Date: 02/13/2019 CLINICAL DATA:  Pain and shortness of breath. EXAM: CHEST - 2 VIEW COMPARISON:  December 08, 2018 FINDINGS: The heart size and mediastinal contours are within normal limits. Both lungs are clear. The visualized skeletal structures are unremarkable. IMPRESSION: No active cardiopulmonary disease. Electronically Signed   By: Dorise Bullion III M.D   On: 02/13/2019 10:57   Dg Cervical Spine  Complete  Result Date: 02/13/2019 CLINICAL DATA:  Left shoulder pain. EXAM: CERVICAL SPINE - COMPLETE 4+ VIEW COMPARISON:  None. FINDINGS: Straightening of normal lordosis. No other malalignment. No fracture. Mild multilevel degenerative disc disease with small anterior and posterior osteophytes. Neural foramina remain patent. Uncovertebral changes identified. Lateral masses C1 align with C2. The odontoid process is normal. The lung apices are normal. IMPRESSION: Multilevel degenerative changes as above.  No other abnormalities. Electronically Signed   By: Dorise Bullion III M.D   On: 02/13/2019 10:58     Signed Lamar Blinks, MD  Received her labs 11/12, as follows  Mild increase of AST, ALT is not entirely new-however now more pronounced Last right upper quadrant ultrasound 2017-showed fatty infiltration of liver She has been screened for hep C and was negative FSH consistent with menopause A1c elevated in the diabetes range for the first time Takeing pravastatin 20  Message to patient-   Blood counts are normal Metabolic profile looks fine except for high blood sugar, and also mild elevation of your liver function tests-AST and ALT  Your A1c-average blood sugar the previous 3 months-is also elevated into the diabetes range for the first time  Your cholesterol-especially triglycerides-is higher than ideal  Thyroid normal FSH consistent with menopause  I am waiting for your urine culture result  I would suggest that we start you on an oral medication to help bring down your blood sugar, called metformin.  This may also improve your triglycerides.  Please let me know if okay with you, and I will send in a prescription.    In any case, I would like to recheck your labs in 2 to 3 months to follow-up  Please let me know your thoughts about seeing an orthopedist for your neck and shoulder, and starting Metformin  Results for orders placed or performed in visit on 02/13/19   Urine Culture   Specimen: Blood  Result Value Ref Range   MICRO NUMBER: [id number]    SPECIMEN QUALITY: Adequate    Sample Source NOT GIVEN    STATUS: FINAL    ISOLATE 1:      Growth of mixed flora was isolated, suggesting probable contamination. No further testing will be performed. If clinically indicated, recollection using a method to minimize contamination, with prompt transfer to Urine Culture Transport Tube, is  recommended.   CBC  Result Value Ref Range   WBC 7.1 4.0 - 10.5 K/uL   RBC 4.66 3.87 - 5.11 Mil/uL   Platelets 212.0 150.0 - 400.0 K/uL   Hemoglobin 13.8 12.0 - 15.0 g/dL   HCT 41.1 36.0 - 46.0 %   MCV 88.1 78.0 - 100.0 fl   MCHC 33.6 30.0 - 36.0 g/dL   RDW 12.7 11.5 - 15.5 %  Comprehensive metabolic panel  Result Value Ref Range   Sodium 137 135 - 145 mEq/L   Potassium 4.4 3.5 - 5.1 mEq/L   Chloride 100 96 - 112 mEq/L   CO2 30 19 - 32 mEq/L   Glucose, Bld  241 (H) 70 - 99 mg/dL   BUN 12 6 - 23 mg/dL   Creatinine, Ser 0.62 0.40 - 1.20 mg/dL   Total Bilirubin 0.3 0.2 - 1.2 mg/dL   Alkaline Phosphatase 73 39 - 117 U/L   AST 42 (H) 0 - 37 U/L   ALT 68 (H) 0 - 35 U/L   Total Protein 7.4 6.0 - 8.3 g/dL   Albumin 4.5 3.5 - 5.2 g/dL   GFR 99.58 >60.00 mL/min   Calcium 10.0 8.4 - 10.5 mg/dL  Hemoglobin A1c  Result Value Ref Range   Hgb A1c MFr Bld 6.9 (H) 4.6 - 6.5 %  Lipid panel  Result Value Ref Range   Cholesterol 236 (H) 0 - 200 mg/dL   Triglycerides 234.0 (H) 0.0 - 149.0 mg/dL   HDL 59.10 >39.00 mg/dL   VLDL 46.8 (H) 0.0 - 40.0 mg/dL   Total CHOL/HDL Ratio 4    NonHDL 177.01   TSH  Result Value Ref Range   TSH 3.11 0.35 - 4.50 uIU/mL  FSH  Result Value Ref Range   FSH 44.2 mIU/ML  LDL cholesterol, direct  Result Value Ref Range   Direct LDL 152.0 mg/dL  POCT Urinalysis Dipstick (Automated)  Result Value Ref Range   Color, UA yellow    Clarity, UA clear    Glucose, UA Negative Negative   Bilirubin, UA negative    Ketones, UA negative    Spec  Grav, UA 1.025 1.010 - 1.025   Blood, UA negative    pH, UA 6.0 5.0 - 8.0   Protein, UA Negative Negative   Urobilinogen, UA negative (A) 0.2 or 1.0 E.U./dL   Nitrite, UA 0.2    Leukocytes, UA Negative Negative  Troponin I (High Sensitivity)  Result Value Ref Range   High Sens Troponin I 5 2 - 17 ng/L   Received her urine culture 11/13- message to pt

## 2019-02-13 ENCOUNTER — Ambulatory Visit: Payer: Medicare PPO | Admitting: Family Medicine

## 2019-02-13 ENCOUNTER — Encounter: Payer: Self-pay | Admitting: Family Medicine

## 2019-02-13 ENCOUNTER — Ambulatory Visit (INDEPENDENT_AMBULATORY_CARE_PROVIDER_SITE_OTHER): Payer: Medicare PPO | Admitting: Family Medicine

## 2019-02-13 ENCOUNTER — Other Ambulatory Visit: Payer: Self-pay

## 2019-02-13 ENCOUNTER — Ambulatory Visit (HOSPITAL_BASED_OUTPATIENT_CLINIC_OR_DEPARTMENT_OTHER)
Admission: RE | Admit: 2019-02-13 | Discharge: 2019-02-13 | Disposition: A | Discharge status: Home / Self Care | Payer: Medicare PPO | Source: Ambulatory Visit | Attending: Family Medicine | Admitting: Family Medicine

## 2019-02-13 VITALS — BP 140/80 | HR 72 | Temp 97.1°F | Resp 16 | Ht 66.0 in | Wt 193.0 lb

## 2019-02-13 DIAGNOSIS — I10 Essential (primary) hypertension: Secondary | ICD-10-CM

## 2019-02-13 DIAGNOSIS — M25512 Pain in left shoulder: Secondary | ICD-10-CM | POA: Diagnosis not present

## 2019-02-13 DIAGNOSIS — R232 Flushing: Secondary | ICD-10-CM

## 2019-02-13 DIAGNOSIS — Z1231 Encounter for screening mammogram for malignant neoplasm of breast: Secondary | ICD-10-CM | POA: Diagnosis not present

## 2019-02-13 DIAGNOSIS — R350 Frequency of micturition: Secondary | ICD-10-CM

## 2019-02-13 DIAGNOSIS — R7303 Prediabetes: Secondary | ICD-10-CM | POA: Diagnosis not present

## 2019-02-13 DIAGNOSIS — E785 Hyperlipidemia, unspecified: Secondary | ICD-10-CM | POA: Diagnosis not present

## 2019-02-13 DIAGNOSIS — Z1329 Encounter for screening for other suspected endocrine disorder: Secondary | ICD-10-CM

## 2019-02-13 DIAGNOSIS — M47812 Spondylosis without myelopathy or radiculopathy, cervical region: Secondary | ICD-10-CM | POA: Diagnosis not present

## 2019-02-13 DIAGNOSIS — R0602 Shortness of breath: Secondary | ICD-10-CM | POA: Diagnosis not present

## 2019-02-13 LAB — POC URINALSYSI DIPSTICK (AUTOMATED)
Bilirubin, UA: NEGATIVE
Blood, UA: NEGATIVE
Glucose, UA: NEGATIVE
Ketones, UA: NEGATIVE
Leukocytes, UA: NEGATIVE
Nitrite, UA: 0.2
Protein, UA: NEGATIVE
Spec Grav, UA: 1.025 (ref 1.010–1.025)
Urobilinogen, UA: NEGATIVE E.U./dL — AB
pH, UA: 6 (ref 5.0–8.0)

## 2019-02-13 LAB — CBC
HCT: 41.1 % (ref 36.0–46.0)
Hemoglobin: 13.8 g/dL (ref 12.0–15.0)
MCHC: 33.6 g/dL (ref 30.0–36.0)
MCV: 88.1 fl (ref 78.0–100.0)
Platelets: 212 10*3/uL (ref 150.0–400.0)
RBC: 4.66 Mil/uL (ref 3.87–5.11)
RDW: 12.7 % (ref 11.5–15.5)
WBC: 7.1 10*3/uL (ref 4.0–10.5)

## 2019-02-13 LAB — COMPREHENSIVE METABOLIC PANEL
ALT: 68 U/L — ABNORMAL HIGH (ref 0–35)
AST: 42 U/L — ABNORMAL HIGH (ref 0–37)
Albumin: 4.5 g/dL (ref 3.5–5.2)
Alkaline Phosphatase: 73 U/L (ref 39–117)
BUN: 12 mg/dL (ref 6–23)
CO2: 30 mEq/L (ref 19–32)
Calcium: 10 mg/dL (ref 8.4–10.5)
Chloride: 100 mEq/L (ref 96–112)
Creatinine, Ser: 0.62 mg/dL (ref 0.40–1.20)
GFR: 99.58 mL/min (ref >60.00)
Glucose, Bld: 241 mg/dL — ABNORMAL HIGH (ref 70–99)
Potassium: 4.4 mEq/L (ref 3.5–5.1)
Sodium: 137 mEq/L (ref 135–145)
Total Bilirubin: 0.3 mg/dL (ref 0.2–1.2)
Total Protein: 7.4 g/dL (ref 6.0–8.3)

## 2019-02-13 LAB — LIPID PANEL
Cholesterol: 236 mg/dL — ABNORMAL HIGH (ref 0–200)
HDL: 59.1 mg/dL (ref >39.00)
NonHDL: 177.01
Total CHOL/HDL Ratio: 4
Triglycerides: 234 mg/dL — ABNORMAL HIGH (ref 0.0–149.0)
VLDL: 46.8 mg/dL — ABNORMAL HIGH (ref 0.0–40.0)

## 2019-02-13 LAB — TROPONIN I (HIGH SENSITIVITY): High Sens Troponin I: 5 ng/L (ref 2–17)

## 2019-02-13 LAB — TSH: TSH: 3.11 u[IU]/mL (ref 0.35–4.50)

## 2019-02-13 LAB — HEMOGLOBIN A1C: Hgb A1c MFr Bld: 6.9 % — ABNORMAL HIGH (ref 4.6–6.5)

## 2019-02-13 LAB — LDL CHOLESTEROL, DIRECT: Direct LDL: 152 mg/dL

## 2019-02-13 LAB — FOLLICLE STIMULATING HORMONE: FSH: 44.2 m[IU]/mL

## 2019-02-13 MED ORDER — CYCLOBENZAPRINE HCL 10 MG PO TABS: 10.0000 mg | ORAL_TABLET | Freq: Two times a day (BID) | ORAL | 0 refills | Status: DC | PRN

## 2019-02-13 NOTE — Patient Instructions (Addendum)
Good to see you again today!  I ordered a mammogram for you to have done at your convenience Your left shoulder pain is a bit unusual- it may be due to a pinched nerve in your neck or an issue with the shoulder itself We also did a bit more evaluation to make sure no sign of heart issues today- so far this looks ok, I am also getting a "troponin" level which will be in later today  Please go to lab, and then to x-ray on the ground floor for films of you chest and neck  I will be in touch with your results I suspect you may need to see orthopedics for a shoulder injection  Please also think about getting the shingles vaccine (shingrix) at your convenience in the next year or os

## 2019-02-14 ENCOUNTER — Encounter: Payer: Self-pay | Admitting: Family Medicine

## 2019-02-14 DIAGNOSIS — M25512 Pain in left shoulder: Secondary | ICD-10-CM

## 2019-02-14 DIAGNOSIS — R7303 Prediabetes: Secondary | ICD-10-CM

## 2019-02-14 MED ORDER — METFORMIN HCL 500 MG PO TABS: 500.0000 mg | ORAL_TABLET | Freq: Two times a day (BID) | ORAL | 3 refills | Status: DC

## 2019-02-15 ENCOUNTER — Encounter: Payer: Self-pay | Admitting: Family Medicine

## 2019-02-15 LAB — URINE CULTURE
MICRO NUMBER:: 1088992
SPECIMEN QUALITY:: ADEQUATE

## 2019-02-19 ENCOUNTER — Encounter: Payer: Self-pay | Admitting: Family Medicine

## 2019-02-19 ENCOUNTER — Ambulatory Visit: Payer: Medicare PPO | Admitting: Family Medicine

## 2019-02-20 DIAGNOSIS — F331 Major depressive disorder, recurrent, moderate: Secondary | ICD-10-CM | POA: Diagnosis not present

## 2019-02-26 ENCOUNTER — Inpatient Hospital Stay (HOSPITAL_BASED_OUTPATIENT_CLINIC_OR_DEPARTMENT_OTHER): Admission: RE | Admit: 2019-02-26 | Payer: Medicare PPO | Source: Ambulatory Visit

## 2019-03-04 ENCOUNTER — Ambulatory Visit (HOSPITAL_BASED_OUTPATIENT_CLINIC_OR_DEPARTMENT_OTHER)
Admission: RE | Admit: 2019-03-04 | Discharge: 2019-03-04 | Disposition: A | Discharge status: Home / Self Care | Payer: Medicare PPO | Source: Ambulatory Visit | Attending: Family Medicine | Admitting: Family Medicine

## 2019-03-04 ENCOUNTER — Other Ambulatory Visit: Payer: Self-pay

## 2019-03-04 DIAGNOSIS — Z1231 Encounter for screening mammogram for malignant neoplasm of breast: Secondary | ICD-10-CM | POA: Diagnosis not present

## 2019-03-06 DIAGNOSIS — F319 Bipolar disorder, unspecified: Secondary | ICD-10-CM | POA: Diagnosis not present

## 2019-03-20 DIAGNOSIS — F331 Major depressive disorder, recurrent, moderate: Secondary | ICD-10-CM | POA: Diagnosis not present

## 2019-04-01 DIAGNOSIS — F319 Bipolar disorder, unspecified: Secondary | ICD-10-CM | POA: Diagnosis not present

## 2019-04-15 ENCOUNTER — Other Ambulatory Visit: Payer: Self-pay | Admitting: Family Medicine

## 2019-04-15 MED ORDER — LOSARTAN POTASSIUM-HCTZ 50-12.5 MG PO TABS: 1.0000 | ORAL_TABLET | Freq: Every day | ORAL | 2 refills | Status: DC

## 2019-04-15 NOTE — Telephone Encounter (Signed)
Requested medication (s) are due for refill today: yes  Requested medication (s) are on the active medication list: yes  Last refill:  12/28/2018  Future visit scheduled: no  Notes to clinic:  Cardiovascular: ARB + Diuretic Combo failed  Requested Prescriptions  Pending Prescriptions Disp Refills   losartan-hydrochlorothiazide (HYZAAR) 50-12.5 MG tablet 30 tablet 2    Sig: Take 1 tablet by mouth daily.      Cardiovascular: ARB + Diuretic Combos Failed - 04/15/2019  3:42 PM      Failed - Last BP in normal range    BP Readings from Last 1 Encounters:  02/13/19 140/80          Passed - K in normal range and within 180 days    Potassium  Date Value Ref Range Status  02/13/2019 4.4 3.5 - 5.1 mEq/L Final          Passed - Na in normal range and within 180 days    Sodium  Date Value Ref Range Status  02/13/2019 137 135 - 145 mEq/L Final          Passed - Cr in normal range and within 180 days    Creat  Date Value Ref Range Status  12/28/2015 0.61 0.50 - 1.05 mg/dL Final    Comment:      For patients > or = 57 years of age: The upper reference limit for Creatinine is approximately 13% higher for people identified as African-American.      Creatinine, Ser  Date Value Ref Range Status  02/13/2019 0.62 0.40 - 1.20 mg/dL Final          Passed - Ca in normal range and within 180 days    Calcium  Date Value Ref Range Status  02/13/2019 10.0 8.4 - 10.5 mg/dL Final   Calcium, Ion  Date Value Ref Range Status  07/15/2011 1.33 (H) 1.12 - 1.32 mmol/L Final          Passed - Patient is not pregnant      Passed - Valid encounter within last 6 months    Recent Outpatient Visits           2 months ago Pre-diabetes   [facility], MD   5 months ago [facility], Gay Filler, MD   1 year ago Viral URI with cough   [facility], [place], [place]   1 year ago Screening for cervical cancer   [facility], Gay Filler, MD   1 year ago Gastroesophageal reflux disease, esophagitis presence not specified   [facility], Gay Filler, MD

## 2019-04-15 NOTE — Telephone Encounter (Signed)
Medication Refill - Medication: losartan-hydrochlorothiazide (HYZAAR) 50-12.5 MG tablet [id number]     Preferred Pharmacy (with phone number or street name):  [facility] [place], [place]  [place] [address]  Phone: [phone] Fax: [phone]    Agent: Please be advised that RX refills may take up to 3 business days. We ask that you follow-up with your pharmacy.

## 2019-04-17 DIAGNOSIS — F319 Bipolar disorder, unspecified: Secondary | ICD-10-CM | POA: Diagnosis not present

## 2019-04-28 NOTE — Progress Notes (Deleted)
[place] at [facility] [address], [place], [place] [address] [phone]  Date:  05/01/2019   Name:  Claudia Gregory   DOB:  14-Sep-1960   MRN:  [id number]  PCP:  Darreld Mclean, MD    Chief Complaint: No chief complaint on file.   History of Present Illness:  Claudia Gregory is a 59 y.o. very pleasant female patient who presents with the following:  Here today to follow-up on prediabetes-we added Metformin to her regimen in November as her A1c was higher than 6.5%-so far only one reading in this range Lab Results  Component Value Date   HGBA1C 6.9 (H) 02/13/2019    Patient also with history of deafness, GERD, hyperlipidemia  Losartan/HCTZ Metformin Protonix Pravachol Sertraline  Flu vaccine up-to-date Mention Shingrix  Patient Active Problem List   Diagnosis Date Noted  . Generalized anxiety disorder 12/28/2015  . Elevated liver enzymes 12/28/2015  . Hyperglycemia 12/28/2015  . Plantar fasciitis of right foot 04/01/2015  . Neuropathic pain of both feet 10/27/2014  . Hx of adenomatous polyp of colon 08/08/2014  . Deafness 08/04/2014  . GERD (gastroesophageal reflux disease) 05/13/2014  . Hyperlipidemia 02/05/2013  . Hot flashes 11/24/2012  . OBESITY 10/23/2008  . Hypertension 10/23/2008  . BIPOLAR DISORDER UNSPECIFIED 09/02/2008    Past Medical History:  Diagnosis Date  . Anxiety   . Bipolar 1 disorder ([place])   . Carpal tunnel syndrome of left wrist   . Chest pain, atypical   . Deaf   . Depression   . FATIGUE   . Gallstones   . GERD (gastroesophageal reflux disease)   . Hx of adenomatous polyp of colon 08/08/2014  . Hypertension   . OBESITY   . Pneumonia     Past Surgical History:  Procedure Laterality Date  . CHOLECYSTECTOMY  2000  . COLONOSCOPY  2016   5 mm adenoma  . TUBAL LIGATION  1998    Social History   Tobacco Use  . Smoking status: Never Smoker  . Smokeless tobacco: Never Used   Substance Use Topics  . Alcohol use: Yes    Alcohol/week: 2.0 standard drinks    Types: 2 Glasses of wine per week    Comment: rare glass of wine   . Drug use: No    Family History  Problem Relation Age of Onset  . Brain cancer Mother   . Hypertension Mother   . Diabetes Mother   . Diabetes Maternal Aunt   . Heart attack Maternal Grandmother   . Hypertension Maternal Grandmother   . Colon cancer Neg Hx   . Rectal cancer Neg Hx   . Stomach cancer Neg Hx     No Known Allergies  Medication list has been reviewed and updated.  Current Outpatient Medications on File Prior to Visit  Medication Sig Dispense Refill  . cyclobenzaprine (FLEXERIL) 10 MG tablet Take 1 tablet (10 mg total) by mouth 2 (two) times daily as needed for muscle spasms. Caution regarding sedation 30 tablet 0  . fluticasone (CUTIVATE) 0.05 % cream Apply topically 2 (two) times daily. 60 g 1  . fluticasone (CUTIVATE) 0.05 % cream Apply topically 2 (two) times daily. 30 g 0  . losartan-hydrochlorothiazide (HYZAAR) 50-12.5 MG tablet Take 1 tablet by mouth daily. 30 tablet 2  . metFORMIN (GLUCOPHAGE) 500 MG tablet Take 1 tablet (500 mg total) by mouth 2 (two) times daily with a meal. 180 tablet 3  .  pantoprazole (PROTONIX) 20 MG tablet Take 1 tablet (20 mg total) by mouth daily. 30 tablet 1  . pravastatin (PRAVACHOL) 20 MG tablet TAKE 1 TABLET(20 MG) BY MOUTH DAILY 90 tablet 3  . sertraline (ZOLOFT) 100 MG tablet Take 100 mg by mouth daily.     No current facility-administered medications on file prior to visit.    Review of Systems:  As per HPI- otherwise negative.   Physical Examination: There were no vitals filed for this visit. There were no vitals filed for this visit. There is no height or weight on file to calculate BMI. Ideal Body Weight:    GEN: WDWN, NAD, Non-toxic, A & O x 3 HEENT: Atraumatic, Normocephalic. Neck supple. No masses, No LAD. Ears and Nose: No external deformity. CV: RRR, No  M/G/R. No JVD. No thrill. No extra heart sounds. PULM: CTA B, no wheezes, crackles, rhonchi. No retractions. No resp. distress. No accessory muscle use. ABD: S, NT, ND, +BS. No rebound. No HSM. EXTR: No c/c/e NEURO Normal gait.  PSYCH: Normally interactive. Conversant. Not depressed or anxious appearing.  Calm demeanor.    Assessment and Plan: *** This visit occurred during the SARS-CoV-2 public health emergency.  Safety protocols were in place, including screening questions prior to the visit, additional usage of staff PPE, and extensive cleaning of exam room while observing appropriate contact time as indicated for disinfecting solutions.    Signed Lamar Blinks, MD

## 2019-05-01 ENCOUNTER — Ambulatory Visit: Payer: Medicare PPO | Admitting: Family Medicine

## 2019-05-03 DIAGNOSIS — F319 Bipolar disorder, unspecified: Secondary | ICD-10-CM | POA: Diagnosis not present

## 2019-05-06 NOTE — Progress Notes (Deleted)
[place] at [facility] [address] [place], [place] [address] [phone]  Date:  05/08/2019   Name:  Claudia Gregory   DOB:  Jul 13, 1960   MRN:  [id number]  PCP:  Darreld Mclean, MD    Chief Complaint: No chief complaint on file.   History of Present Illness:  Claudia Gregory is a 59 y.o. very pleasant female patient who presents with the following:  Here today for a follow-up visit Patient with history of hypertension, GERD, hyperlipidemia, mood disorder, deafness Last seen by myself in November 2020 We started her on Metformin in November due to 1 A1c reading greater than 6.5/prediabetic as only 1 reading over 6.5 Patient Active Problem List   Diagnosis Date Noted  . Generalized anxiety disorder 12/28/2015  . Elevated liver enzymes 12/28/2015  . Hyperglycemia 12/28/2015  . Plantar fasciitis of right foot 04/01/2015  . Neuropathic pain of both feet 10/27/2014  . Hx of adenomatous polyp of colon 08/08/2014  . Deafness 08/04/2014  . GERD (gastroesophageal reflux disease) 05/13/2014  . Hyperlipidemia 02/05/2013  . Hot flashes 11/24/2012  . OBESITY 10/23/2008  . Hypertension 10/23/2008  . BIPOLAR DISORDER UNSPECIFIED 09/02/2008    Past Medical History:  Diagnosis Date  . Anxiety   . Bipolar 1 disorder ([place])   . Carpal tunnel syndrome of left wrist   . Chest pain, atypical   . Deaf   . Depression   . FATIGUE   . Gallstones   . GERD (gastroesophageal reflux disease)   . Hx of adenomatous polyp of colon 08/08/2014  . Hypertension   . OBESITY   . Pneumonia     Past Surgical History:  Procedure Laterality Date  . CHOLECYSTECTOMY  2000  . COLONOSCOPY  2016   5 mm adenoma  . TUBAL LIGATION  1998    Social History   Tobacco Use  . Smoking status: Never Smoker  . Smokeless tobacco: Never Used  Substance Use Topics  . Alcohol use: Yes    Alcohol/week: 2.0 standard drinks    Types: 2 Glasses of wine per week     Comment: rare glass of wine   . Drug use: No    Family History  Problem Relation Age of Onset  . Brain cancer Mother   . Hypertension Mother   . Diabetes Mother   . Diabetes Maternal Aunt   . Heart attack Maternal Grandmother   . Hypertension Maternal Grandmother   . Colon cancer Neg Hx   . Rectal cancer Neg Hx   . Stomach cancer Neg Hx     No Known Allergies  Medication list has been reviewed and updated.  Current Outpatient Medications on File Prior to Visit  Medication Sig Dispense Refill  . cyclobenzaprine (FLEXERIL) 10 MG tablet Take 1 tablet (10 mg total) by mouth 2 (two) times daily as needed for muscle spasms. Caution regarding sedation 30 tablet 0  . fluticasone (CUTIVATE) 0.05 % cream Apply topically 2 (two) times daily. 60 g 1  . fluticasone (CUTIVATE) 0.05 % cream Apply topically 2 (two) times daily. 30 g 0  . losartan-hydrochlorothiazide (HYZAAR) 50-12.5 MG tablet Take 1 tablet by mouth daily. 30 tablet 2  . metFORMIN (GLUCOPHAGE) 500 MG tablet Take 1 tablet (500 mg total) by mouth 2 (two) times daily with a meal. 180 tablet 3  . pantoprazole (PROTONIX) 20 MG tablet Take 1 tablet (20 mg total) by mouth daily. 30 tablet 1  .  pravastatin (PRAVACHOL) 20 MG tablet TAKE 1 TABLET(20 MG) BY MOUTH DAILY 90 tablet 3  . sertraline (ZOLOFT) 100 MG tablet Take 100 mg by mouth daily.     No current facility-administered medications on file prior to visit.    Review of Systems:  As per HPI- otherwise negative.   Physical Examination: There were no vitals filed for this visit. There were no vitals filed for this visit. There is no height or weight on file to calculate BMI. Ideal Body Weight:    GEN: WDWN, NAD, Non-toxic, A & O x 3 HEENT: Atraumatic, Normocephalic. Neck supple. No masses, No LAD. Ears and Nose: No external deformity. CV: RRR, No M/G/R. No JVD. No thrill. No extra heart sounds. PULM: CTA B, no wheezes, crackles, rhonchi. No retractions. No resp.  distress. No accessory muscle use. ABD: S, NT, ND, +BS. No rebound. No HSM. EXTR: No c/c/e NEURO Normal gait.  PSYCH: Normally interactive. Conversant. Not depressed or anxious appearing.  Calm demeanor.    Assessment and Plan: *** Moderate medical decision making today  This visit occurred during the SARS-CoV-2 public health emergency.  Safety protocols were in place, including screening questions prior to the visit, additional usage of staff PPE, and extensive cleaning of exam room while observing appropriate contact time as indicated for disinfecting solutions.    Signed Lamar Blinks, MD

## 2019-05-08 ENCOUNTER — Ambulatory Visit: Payer: Medicare PPO | Admitting: Family Medicine

## 2019-05-17 DIAGNOSIS — F319 Bipolar disorder, unspecified: Secondary | ICD-10-CM | POA: Diagnosis not present

## 2019-06-12 DIAGNOSIS — F319 Bipolar disorder, unspecified: Secondary | ICD-10-CM | POA: Diagnosis not present

## 2019-07-10 ENCOUNTER — Other Ambulatory Visit: Payer: Self-pay

## 2019-07-11 ENCOUNTER — Ambulatory Visit (INDEPENDENT_AMBULATORY_CARE_PROVIDER_SITE_OTHER): Payer: Medicare PPO | Admitting: Family Medicine

## 2019-07-11 ENCOUNTER — Encounter: Payer: Self-pay | Admitting: Family Medicine

## 2019-07-11 VITALS — BP 132/82 | HR 70 | Temp 96.9°F | Resp 16 | Ht 66.0 in | Wt 188.0 lb

## 2019-07-11 DIAGNOSIS — Z0000 Encounter for general adult medical examination without abnormal findings: Secondary | ICD-10-CM | POA: Diagnosis not present

## 2019-07-11 DIAGNOSIS — E785 Hyperlipidemia, unspecified: Secondary | ICD-10-CM

## 2019-07-11 DIAGNOSIS — I10 Essential (primary) hypertension: Secondary | ICD-10-CM

## 2019-07-11 DIAGNOSIS — R7303 Prediabetes: Secondary | ICD-10-CM | POA: Insufficient documentation

## 2019-07-11 DIAGNOSIS — E119 Type 2 diabetes mellitus without complications: Secondary | ICD-10-CM | POA: Insufficient documentation

## 2019-07-11 MED ORDER — LOSARTAN POTASSIUM-HCTZ 50-12.5 MG PO TABS: 1.0000 | ORAL_TABLET | Freq: Every day | ORAL | 3 refills | Status: DC

## 2019-07-11 NOTE — Progress Notes (Addendum)
[place] at [facility] [place], [place], [place] [address] [phone]  Date:  07/11/2019   Name:  Claudia Gregory   DOB:  01-25-1961   MRN:  [id number]  PCP:  Darreld Mclean, MD    Chief Complaint: Annual Exam   History of Present Illness:  Claudia Gregory is a 59 y.o. very pleasant female patient who presents with the following:  Here today for routine physical Patient with history of deafness, hypertension, GERD, hyperlipidemia, obesity, peripheral neuropathy, prediabetes Her last A1c in November was 6.9%, previously had been in prediabetes range I started her on Metformin at that time She did not like taking it-she ran out and did not continue to take it due to side effects, GI upset  Colon cancer screening due this year- I will message Carlean Purl for her to get in touch  Pap is up-to-date Mammogram up-to-date Covid series-complete Shingrix-delay as she just finished Covid series Complete labs done in November-today needs c-Met and A1c, would also like to repeat lipids If still not at goal consider increasing intensity of statin therapy  Losartan/HCTZ Metformin- did not tolerate well Protonix Sertraline Pravachol-has run out, not currently taking  She is not getting a whole lot of exercise right now Never a smoker Wt Readings from Last 3 Encounters:  07/11/19 188 lb (85.3 kg)  02/13/19 193 lb (87.5 kg)  12/08/18 190 lb (86.2 kg)    Patient Active Problem List   Diagnosis Date Noted  . Prediabetes 07/11/2019  . Generalized anxiety disorder 12/28/2015  . Elevated liver enzymes 12/28/2015  . Plantar fasciitis of right foot 04/01/2015  . Neuropathic pain of both feet 10/27/2014  . Hx of adenomatous polyp of colon 08/08/2014  . Deafness 08/04/2014  . GERD (gastroesophageal reflux disease) 05/13/2014  . Hyperlipidemia 02/05/2013  . Hot flashes 11/24/2012  . OBESITY 10/23/2008  . Hypertension 10/23/2008  .  BIPOLAR DISORDER UNSPECIFIED 09/02/2008    Past Medical History:  Diagnosis Date  . Anxiety   . Bipolar 1 disorder ([place])   . Carpal tunnel syndrome of left wrist   . Chest pain, atypical   . Deaf   . Depression   . FATIGUE   . Gallstones   . GERD (gastroesophageal reflux disease)   . Hx of adenomatous polyp of colon 08/08/2014  . Hypertension   . OBESITY   . Pneumonia     Past Surgical History:  Procedure Laterality Date  . CHOLECYSTECTOMY  2000  . COLONOSCOPY  2016   5 mm adenoma  . TUBAL LIGATION  1998    Social History   Tobacco Use  . Smoking status: Never Smoker  . Smokeless tobacco: Never Used  Substance Use Topics  . Alcohol use: Yes    Alcohol/week: 2.0 standard drinks    Types: 2 Glasses of wine per week    Comment: rare glass of wine   . Drug use: No    Family History  Problem Relation Age of Onset  . Brain cancer Mother   . Hypertension Mother   . Diabetes Mother   . Diabetes Maternal Aunt   . Heart attack Maternal Grandmother   . Hypertension Maternal Grandmother   . Colon cancer Neg Hx   . Rectal cancer Neg Hx   . Stomach cancer Neg Hx     No Known Allergies  Medication list has been reviewed and updated.  Current Outpatient Medications on File Prior to Visit  Medication Sig Dispense Refill  . losartan-hydrochlorothiazide (HYZAAR) 50-12.5 MG tablet Take 1 tablet by mouth daily. 30 tablet 2  . metFORMIN (GLUCOPHAGE) 500 MG tablet Take 1 tablet (500 mg total) by mouth 2 (two) times daily with a meal. 180 tablet 3  . pantoprazole (PROTONIX) 20 MG tablet Take 1 tablet (20 mg total) by mouth daily. 30 tablet 1  . pravastatin (PRAVACHOL) 20 MG tablet TAKE 1 TABLET(20 MG) BY MOUTH DAILY 90 tablet 3  . sertraline (ZOLOFT) 100 MG tablet Take 100 mg by mouth daily.     No current facility-administered medications on file prior to visit.    Review of Systems:  As per HPI- otherwise negative.   Physical Examination: Vitals:   07/11/19 1402   BP: 132/82  Pulse: 70  Resp: 16  Temp: (!) 96.9 F (36.1 C)  SpO2: 94%   Vitals:   07/11/19 1402  Weight: 188 lb (85.3 kg)  Height: _0  (1.676 m)   Body mass index is 30.34 kg/m. Ideal Body Weight: Weight in (lb) to have BMI = 25: 154.6  GEN: no acute distress.  Overweight, otherwise looks well HEENT: Atraumatic, Normocephalic.   Bilateral TM wnl, oropharynx normal.  PEERL,EOMI.   Ears and Nose: No external deformity. CV: RRR, No M/G/R. No JVD. No thrill. No extra heart sounds. PULM: CTA B, no wheezes, crackles, rhonchi. No retractions. No resp. distress. No accessory muscle use. ABD: S, NT, ND, +BS. No rebound. No HSM. EXTR: No c/c/e PSYCH: Normally interactive. Conversant.    Assessment and Plan: Physical exam  Prediabetes - Plan: Hemoglobin A1c  Essential hypertension - Plan: Comprehensive metabolic panel, losartan-hydrochlorothiazide (HYZAAR) 50-12.5 MG tablet  Dyslipidemia - Plan: Lipid panel  Here today for complete physical A1c in diabetes range once previously, repeat today.  If elevated again will be diagnosed with diabetes She is not tolerating Metformin, plan to use another medication assuming something is indicated Await cholesterol, may need to step up therapy to Crestor Encourage weight loss, exercise Shingrix at a later date Colonoscopy due this year, patient is aware  Complete physical exam today, labs pending as above Counseled patient regarding exercise, diet, alcohol and tobacco avoidance Will plan further follow- up pending labs.  Signed Lamar Blinks, MD  Received her labs 4/10, message to patient  Results for orders placed or performed in visit on 07/11/19  Comprehensive metabolic panel  Result Value Ref Range   Sodium 136 135 - 145 mEq/L   Potassium 4.0 3.5 - 5.1 mEq/L   Chloride 97 96 - 112 mEq/L   CO2 28 19 - 32 mEq/L   Glucose, Bld 94 70 - 99 mg/dL   BUN 12 6 - 23 mg/dL   Creatinine, Ser 0.61 0.40 - 1.20 mg/dL   Total  Bilirubin 0.5 0.2 - 1.2 mg/dL   Alkaline Phosphatase 80 39 - 117 U/L   AST 47 (H) 0 - 37 U/L   ALT 78 (H) 0 - 35 U/L   Total Protein 7.7 6.0 - 8.3 g/dL   Albumin 4.9 3.5 - 5.2 g/dL   GFR 101.32 >60.00 mL/min   Calcium 11.0 (H) 8.4 - 10.5 mg/dL  Hemoglobin A1c  Result Value Ref Range   Hgb A1c MFr Bld 6.9 (H) 4.6 - 6.5 %  Lipid panel  Result Value Ref Range   Cholesterol 297 (H) 0 - 200 mg/dL   Triglycerides 233.0 (H) 0.0 - 149.0 mg/dL   HDL 59.30 >39.00 mg/dL   VLDL 46.6 (H) 0.0 -  40.0 mg/dL   Total CHOL/HDL Ratio 5    NonHDL 237.61   LDL cholesterol, direct  Result Value Ref Range   Direct LDL 188.0 mg/dL   Abdominal ultrasound in 2017-fatty liver Elevated LFTs for the last 6 months; basically stable A1c is stable

## 2019-07-11 NOTE — Patient Instructions (Addendum)
It was good to see you again today, I will be in touch with your labs as soon as possible It appears that your colon cancer screening is due this year-I will send Dr Carlean Purl a message for you  Assuming your labs are okay, we can plan to visit in 6 months We will plan your blood sugar and cholesterol medications pending your labs Please consider getting the shingles vaccine at your convenience    Health Maintenance, Female Adopting a healthy lifestyle and getting preventive care are important in promoting health and wellness. Ask your health care provider about:  The right schedule for you to have regular tests and exams.  Things you can do on your own to prevent diseases and keep yourself healthy. What should I know about diet, weight, and exercise? Eat a healthy diet   Eat a diet that includes plenty of vegetables, fruits, low-fat dairy products, and lean protein.  Do not eat a lot of foods that are high in solid fats, added sugars, or sodium. Maintain a healthy weight Body mass index (BMI) is used to identify weight problems. It estimates body fat based on height and weight. Your health care provider can help determine your BMI and help you achieve or maintain a healthy weight. Get regular exercise Get regular exercise. This is one of the most important things you can do for your health. Most adults should:  Exercise for at least 150 minutes each week. The exercise should increase your heart rate and make you sweat (moderate-intensity exercise).  Do strengthening exercises at least twice a week. This is in addition to the moderate-intensity exercise.  Spend less time sitting. Even light physical activity can be beneficial. Watch cholesterol and blood lipids Have your blood tested for lipids and cholesterol at 57 years of age, then have this test every 5 years. Have your cholesterol levels checked more often if:  Your lipid or cholesterol levels are high.  You are older than 57  years of age.  You are at high risk for heart disease. What should I know about cancer screening? Depending on your health history and family history, you may need to have cancer screening at various ages. This may include screening for:  Breast cancer.  Cervical cancer.  Colorectal cancer.  Skin cancer.  Lung cancer. What should I know about heart disease, diabetes, and high blood pressure? Blood pressure and heart disease  High blood pressure causes heart disease and increases the risk of stroke. This is more likely to develop in people who have high blood pressure readings, are of African descent, or are overweight.  Have your blood pressure checked: ? Every 3-5 years if you are 25-77 years of age. ? Every year if you are 35 years old or older. Diabetes Have regular diabetes screenings. This checks your fasting blood sugar level. Have the screening done:  Once every three years after age 12 if you are at a normal weight and have a low risk for diabetes.  More often and at a younger age if you are overweight or have a high risk for diabetes. What should I know about preventing infection? Hepatitis B If you have a higher risk for hepatitis B, you should be screened for this virus. Talk with your health care provider to find out if you are at risk for hepatitis B infection. Hepatitis C Testing is recommended for:  Everyone born from [birth year] through 1963.  Anyone with known risk factors for hepatitis C. Sexually transmitted infections (  STIs)  Get screened for STIs, including gonorrhea and chlamydia, if: ? You are sexually active and are younger than 57 years of age. ? You are older than 57 years of age and your health care provider tells you that you are at risk for this type of infection. ? Your sexual activity has changed since you were last screened, and you are at increased risk for chlamydia or gonorrhea. Ask your health care provider if you are at risk.  Ask your health  care provider about whether you are at high risk for HIV. Your health care provider may recommend a prescription medicine to help prevent HIV infection. If you choose to take medicine to prevent HIV, you should first get tested for HIV. You should then be tested every 3 months for as long as you are taking the medicine. Pregnancy  If you are about to stop having your period (premenopausal) and you may become pregnant, seek counseling before you get pregnant.  Take 400 to 800 micrograms (mcg) of folic acid every day if you become pregnant.  Ask for birth control (contraception) if you want to prevent pregnancy. Osteoporosis and menopause Osteoporosis is a disease in which the bones lose minerals and strength with aging. This can result in bone fractures. If you are 43 years old or older, or if you are at risk for osteoporosis and fractures, ask your health care provider if you should:  Be screened for bone loss.  Take a calcium or vitamin D supplement to lower your risk of fractures.  Be given hormone replacement therapy (HRT) to treat symptoms of menopause. Follow these instructions at home: Lifestyle  Do not use any products that contain nicotine or tobacco, such as cigarettes, e-cigarettes, and chewing tobacco. If you need help quitting, ask your health care provider.  Do not use street drugs.  Do not share needles.  Ask your health care provider for help if you need support or information about quitting drugs. Alcohol use  Do not drink alcohol if: ? Your health care provider tells you not to drink. ? You are pregnant, may be pregnant, or are planning to become pregnant.  If you drink alcohol: ? Limit how much you use to 0-1 drink a day. ? Limit intake if you are breastfeeding.  Be aware of how much alcohol is in your drink. In the [place] one drink equals one 12 oz bottle of beer (355 mL), one 5 oz glass of wine (148 mL), or one 1 oz glass of hard liquor (44 mL). General  instructions  Schedule regular health, dental, and eye exams.  Stay current with your vaccines.  Tell your health care provider if: ? You often feel depressed. ? You have ever been abused or do not feel safe at home. Summary  Adopting a healthy lifestyle and getting preventive care are important in promoting health and wellness.  Follow your health care provider's instructions about healthy diet, exercising, and getting tested or screened for diseases.  Follow your health care provider's instructions on monitoring your cholesterol and blood pressure. This information is not intended to replace advice given to you by your health care provider. Make sure you discuss any questions you have with your health care provider. Document Revised: 03/14/2018 Document Reviewed: 03/14/2018 Elsevier Patient Education  2020 [employer].

## 2019-07-12 LAB — COMPREHENSIVE METABOLIC PANEL
ALT: 78 U/L — ABNORMAL HIGH (ref 0–35)
AST: 47 U/L — ABNORMAL HIGH (ref 0–37)
Albumin: 4.9 g/dL (ref 3.5–5.2)
Alkaline Phosphatase: 80 U/L (ref 39–117)
BUN: 12 mg/dL (ref 6–23)
CO2: 28 mEq/L (ref 19–32)
Calcium: 11 mg/dL — ABNORMAL HIGH (ref 8.4–10.5)
Chloride: 97 mEq/L (ref 96–112)
Creatinine, Ser: 0.61 mg/dL (ref 0.40–1.20)
GFR: 101.32 mL/min (ref >60.00)
Glucose, Bld: 94 mg/dL (ref 70–99)
Potassium: 4 mEq/L (ref 3.5–5.1)
Sodium: 136 mEq/L (ref 135–145)
Total Bilirubin: 0.5 mg/dL (ref 0.2–1.2)
Total Protein: 7.7 g/dL (ref 6.0–8.3)

## 2019-07-12 LAB — LIPID PANEL
Cholesterol: 297 mg/dL — ABNORMAL HIGH (ref 0–200)
HDL: 59.3 mg/dL (ref >39.00)
NonHDL: 237.61
Total CHOL/HDL Ratio: 5
Triglycerides: 233 mg/dL — ABNORMAL HIGH (ref 0.0–149.0)
VLDL: 46.6 mg/dL — ABNORMAL HIGH (ref 0.0–40.0)

## 2019-07-12 LAB — LDL CHOLESTEROL, DIRECT: Direct LDL: 188 mg/dL

## 2019-07-12 LAB — HEMOGLOBIN A1C: Hgb A1c MFr Bld: 6.9 % — ABNORMAL HIGH (ref 4.6–6.5)

## 2019-07-13 ENCOUNTER — Encounter: Payer: Self-pay | Admitting: Family Medicine

## 2019-07-18 DIAGNOSIS — F319 Bipolar disorder, unspecified: Secondary | ICD-10-CM | POA: Diagnosis not present

## 2019-07-25 DIAGNOSIS — F319 Bipolar disorder, unspecified: Secondary | ICD-10-CM | POA: Diagnosis not present

## 2019-08-02 ENCOUNTER — Encounter: Payer: Self-pay | Admitting: Internal Medicine

## 2019-08-02 DIAGNOSIS — F319 Bipolar disorder, unspecified: Secondary | ICD-10-CM | POA: Diagnosis not present

## 2019-08-27 ENCOUNTER — Encounter: Payer: Self-pay | Admitting: Family Medicine

## 2019-09-10 DIAGNOSIS — F319 Bipolar disorder, unspecified: Secondary | ICD-10-CM | POA: Diagnosis not present

## 2019-09-23 ENCOUNTER — Other Ambulatory Visit: Payer: Self-pay

## 2019-09-23 ENCOUNTER — Ambulatory Visit (AMBULATORY_SURGERY_CENTER): Payer: Self-pay | Admitting: *Deleted

## 2019-09-23 ENCOUNTER — Encounter: Payer: Medicare PPO | Admitting: Internal Medicine

## 2019-09-23 VITALS — Ht 66.0 in | Wt 188.0 lb

## 2019-09-23 DIAGNOSIS — Z86010 Personal history of colonic polyps: Secondary | ICD-10-CM

## 2019-09-23 NOTE — Progress Notes (Signed)
Sign language interpreter in [place] with pt- both pt and interpreter aware of  Covid 19 PPE Guidelines in [place]    No egg or soy allergy known to patient  No issues with past sedation with any surgeries  or procedures, no intubation problems  No diet pills per patient No home 02 use per patient  No blood thinners per patient  Pt denies issues with constipation  No A fib or A flutter  EMMI video sent to pt's e mail   07-05-19 comp covid vaccines   Due to the COVID-19 pandemic we are asking patients to follow these guidelines. Please only bring one care partner. Please be aware that your care partner may wait in the car in the parking lot or if they feel like they will be too hot to wait in the car, they may wait in the lobby on the 4th floor. All care partners are required to wear a mask the entire time (we do not have any that we can provide them), they need to practice social distancing, and we will do a Covid check for all patient's and care partners when you arrive. Also we will check their temperature and your temperature. If the care partner waits in their car they need to stay in the parking lot the entire time and we will call them on their cell phone when the patient is ready for discharge so they can bring the car to the front of the building. Also all patient's will need to wear a mask into building.

## 2019-09-27 ENCOUNTER — Encounter: Payer: Self-pay | Admitting: Internal Medicine

## 2019-09-27 ENCOUNTER — Ambulatory Visit (AMBULATORY_SURGERY_CENTER): Payer: Medicare PPO | Admitting: Internal Medicine

## 2019-09-27 ENCOUNTER — Other Ambulatory Visit: Payer: Self-pay

## 2019-09-27 VITALS — BP 111/66 | HR 58 | Temp 96.9°F | Resp 12 | Ht 66.0 in | Wt 188.0 lb

## 2019-09-27 DIAGNOSIS — Z86010 Personal history of colonic polyps: Secondary | ICD-10-CM | POA: Diagnosis not present

## 2019-09-27 DIAGNOSIS — D123 Benign neoplasm of transverse colon: Secondary | ICD-10-CM

## 2019-09-27 MED ORDER — SODIUM CHLORIDE 0.9 % IV SOLN: 500.0000 mL | INTRAVENOUS | Status: DC

## 2019-09-27 NOTE — Progress Notes (Signed)
Called to room to assist during endoscopic procedure.  Patient ID and intended procedure confirmed with present staff. Received instructions for my participation in the procedure from the performing physician.  

## 2019-09-27 NOTE — Op Note (Signed)
[facility] Patient Name: Claudia Gregory Procedure Date: 09/27/2019 7:56 AM MRN: [id number] Endoscopist: Gatha Mayer , MD Age: 59 Referring MD:  Date of Birth: 1960-10-04 Gender: Female Account #: [id number] Procedure:                Colonoscopy Indications:              Surveillance: Personal history of adenomatous                            polyps on last colonoscopy 5 years ago Medicines:                Propofol per Anesthesia, Monitored Anesthesia Care Procedure:                Pre-Anesthesia Assessment:                           - Prior to the procedure, a History and Physical                            was performed, and patient medications and                            allergies were reviewed. The patient's tolerance of                            previous anesthesia was also reviewed. The risks                            and benefits of the procedure and the sedation                            options and risks were discussed with the patient.                            All questions were answered, and informed consent                            was obtained. Prior Anticoagulants: The patient has                            taken no previous anticoagulant or antiplatelet                            agents. ASA Grade Assessment: II - A patient with                            mild systemic disease. After reviewing the risks                            and benefits, the patient was deemed in                            satisfactory condition to undergo the procedure.  After obtaining informed consent, the colonoscope                            was passed under direct vision. Throughout the                            procedure, the patient's blood pressure, pulse, and                            oxygen saturations were monitored continuously. The                            Colonoscope was introduced through the anus and                             advanced to the the cecum, identified by                            appendiceal orifice and ileocecal valve. The                            colonoscopy was performed without difficulty. The                            patient tolerated the procedure well. The quality                            of the bowel preparation was good. The bowel                            preparation used was Miralax via split dose                            instruction. The ileocecal valve, appendiceal                            orifice, and rectum were photographed. Scope In: 8:07:41 AM Scope Out: 8:27:01 AM Scope Withdrawal Time: 0 hours 15 minutes 14 seconds  Total Procedure Duration: 0 hours 19 minutes 20 seconds  Findings:                 The perianal and digital rectal examinations were                            normal.                           A 6 mm polyp was found in the transverse colon. The                            polyp was sessile. The polyp was removed with a                            cold snare. Resection and retrieval were complete.  Verification of patient identification for the                            specimen was done. Estimated blood loss was minimal.                           Multiple diverticula were found in the sigmoid                            colon.                           The exam was otherwise without abnormality on                            direct and retroflexion views. Complications:            No immediate complications. Estimated Blood Loss:     Estimated blood loss was minimal. Impression:               - One 6 mm polyp in the transverse colon, removed                            with a cold snare. Resected and retrieved.                           - Diverticulosis in the sigmoid colon.                           - The examination was otherwise normal on direct                            and retroflexion views.                           -  Personal history of colonic polyp - diminutive                            adenoma 2016. Recommendation:           - Patient has a contact number available for                            emergencies. The signs and symptoms of potential                            delayed complications were discussed with the                            patient. Return to normal activities tomorrow.                            Written discharge instructions were provided to the                            patient.                           -  Resume previous diet.                           - Continue present medications.                           - Repeat colonoscopy is recommended for                            surveillance. The colonoscopy date will be                            determined after pathology results from today's                            exam become available for review. Gatha Mayer, MD 09/27/2019 8:32:35 AM This report has been signed electronically.

## 2019-09-27 NOTE — Progress Notes (Signed)
PT taken to PACU. Monitors in place. VSS. Report given to RN. 

## 2019-09-27 NOTE — Patient Instructions (Signed)
Handouts given:  Polyps X1, diverticulosis Resume previous diet  continue present medications  Await pathologytresults  YOU HAD AN ENDOSCOPIC PROCEDURE TODAY AT [facility]:   Refer to the procedure report that was given to you for any specific questions about what was found during the examination.  If the procedure report does not answer your questions, please call your gastroenterologist to clarify.  If you requested that your care partner not be given the details of your procedure findings, then the procedure report has been included in a sealed envelope for you to review at your convenience later.  YOU SHOULD EXPECT: Some feelings of bloating in the abdomen. Passage of more gas than usual.  Walking can help get rid of the air that was put into your GI tract during the procedure and reduce the bloating. If you had a lower endoscopy (such as a colonoscopy or flexible sigmoidoscopy) you may notice spotting of blood in your stool or on the toilet paper. If you underwent a bowel prep for your procedure, you may not have a normal bowel movement for a few days.  Please Note:  You might notice some irritation and congestion in your nose or some drainage.  This is from the oxygen used during your procedure.  There is no need for concern and it should clear up in a day or so.  SYMPTOMS TO REPORT IMMEDIATELY:   Following lower endoscopy (colonoscopy or flexible sigmoidoscopy):  Excessive amounts of blood in the stool  Significant tenderness or worsening of abdominal pains  Swelling of the abdomen that is new, acute  Fever of 100F or higher    For urgent or emergent issues, a gastroenterologist can be reached at any hour by calling [phone]. Do not use MyChart messaging for urgent concerns.    DIET:  We do recommend a small meal at first, but then you may proceed to your regular diet.  Drink plenty of fluids but you should avoid alcoholic beverages for 24 hours.  ACTIVITY:   You should plan to take it easy for the rest of today and you should NOT DRIVE or use heavy machinery until tomorrow (because of the sedation medicines used during the test).    FOLLOW UP: Our staff will call the number listed on your records 48-72 hours following your procedure to check on you and address any questions or concerns that you may have regarding the information given to you following your procedure. If we do not reach you, we will leave a message.  We will attempt to reach you two times.  During this call, we will ask if you have developed any symptoms of COVID 19. If you develop any symptoms (ie: fever, flu-like symptoms, shortness of breath, cough etc.) before then, please call [phone].  If you test positive for Covid 19 in the 2 weeks post procedure, please call and report this information to [place].    If any biopsies were taken you will be contacted by phone or by letter within the next 1-3 weeks.  Please call us at [phone] if you have not heard about the biopsies in 3 weeks.    SIGNATURES/CONFIDENTIALITY: You and/or your care partner have signed paperwork which will be entered into your electronic medical record.  These signatures attest to the fact that that the information above on your After Visit Summary has been reviewed and is understood.  Full responsibility of the confidentiality of this discharge information lies with you and/or your care-partner.

## 2019-09-27 NOTE — Progress Notes (Addendum)
Vitals by [employer] language interpreter Bethena Roys

## 2019-10-01 ENCOUNTER — Telehealth: Payer: Self-pay

## 2019-10-01 NOTE — Telephone Encounter (Signed)
°  Follow up Call-  Call back number 09/27/2019 02/01/2018  Post procedure Call Back phone  # [phone]  Permission to leave phone message Yes Yes  Some recent data might be hidden     Patient questions:  Do you have a fever, pain , or abdominal swelling? No. Pain Score  0 *  Have you tolerated food without any problems? Yes.    Have you been able to return to your normal activities? Yes.    Do you have any questions about your discharge instructions: Diet   No. Medications  No. Follow up visit  No.  Do you have questions or concerns about your Care? No.  Actions: * If pain score is 4 or above: No action needed, pain <4.  1. Have you developed a fever since your procedure? no  2.   Have you had an respiratory symptoms (SOB or cough) since your procedure? no  3.   Have you tested positive for COVID 19 since your procedure no  4.   Have you had any family members/close contacts diagnosed with the COVID 19 since your procedure?  No  Pt is deaf and she has a phone interpreting system.  This was used for the call back. maw   If yes to any of these questions please route to Joylene John, RN and Erenest Rasher, RN

## 2019-10-03 ENCOUNTER — Encounter: Payer: Self-pay | Admitting: Internal Medicine

## 2019-10-03 DIAGNOSIS — Z86010 Personal history of colonic polyps: Secondary | ICD-10-CM

## 2019-10-09 ENCOUNTER — Ambulatory Visit: Payer: Medicare PPO | Admitting: Family Medicine

## 2019-10-14 DIAGNOSIS — F319 Bipolar disorder, unspecified: Secondary | ICD-10-CM | POA: Diagnosis not present

## 2019-12-24 DIAGNOSIS — F319 Bipolar disorder, unspecified: Secondary | ICD-10-CM | POA: Diagnosis not present

## 2020-01-04 NOTE — Patient Instructions (Addendum)
It was great to see you again today, I will be in touch with your labs soon as possible You got your pneumonia vaccine today  I think seeing a chiropractor may be helpful for your neck pain- here are a few options.  Let me know if you need a referral   [facility], [place], [place]  [phone]  [facility] and [place], [place]  [phone]  [facility] Phone: [phone] [address] [place], [place] [address]  Assuming all is well please see me in about 6 months  If you would like to get a 6 month covid 19 booster I would support you!

## 2020-01-04 NOTE — Progress Notes (Addendum)
[facility] [place], [place], [place] [address] [phone]  Date:  01/06/2020   Name:  Claudia Gregory   DOB:  Jul 20, 1960   MRN:  [id number]  PCP:  Darreld Mclean, MD    Chief Complaint: Hypertension and Neck Pain (radiating down left arm, using hot compress)   History of Present Illness:  Claudia Gregory is a 59 y.o. very pleasant female patient who presents with the following:  Patient with deafness, here today for a follow-up visit-last seen by myself in April History of deafness, hypertension, GERD, hyperlipidemia, obesity, peripheral neuropathy, diabetes diabetes, fatty liver with elevation of LFT  She has noted some pain in her left neck and into her left arm for about 4 months The right arm is ok, the left arm only is bothersome It hurts some in the am when she gets up, stretching can help No known injury She also notes she is feeling tired-this is not new, but she wanted to mention She is not having chest pain or shortness of breath  We had started her on Metformin last year but she did not continue to take it due to side effects, most recent A1c 6.9% She is concerned that her glucose may be up recently  Foot exam- today  Eye exam; this was done in August  Flu vaccine; done last week Pneumonia vaccine- would like to receive today  Update A1c today Pap is up-to-date Mammogram November 2020 Covid vaccine original series done-we discussed the booster, 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 Controlled type 2 diabetes mellitus without complication, without long-term current use of insulin ([place]) 07/11/2019  . Generalized anxiety disorder 12/28/2015  . Elevated liver enzymes 12/28/2015  . Plantar fasciitis of right foot 04/01/2015  . Neuropathic pain of both feet 10/27/2014  . Hx of adenomatous polyp of colon 08/08/2014   . Deafness 08/04/2014  . GERD (gastroesophageal reflux disease) 05/13/2014  . Hyperlipidemia 02/05/2013  . Hot flashes 11/24/2012  . OBESITY 10/23/2008  . Hypertension 10/23/2008  . BIPOLAR DISORDER UNSPECIFIED 09/02/2008    Past Medical History:  Diagnosis Date  . Anxiety   . Bipolar 1 disorder ([place])   . Carpal tunnel syndrome of left wrist   . Chest pain, atypical   . Deaf   . Depression   . Diabetes mellitus without complication ([place])    past hx- off meds   . FATIGUE   . Gallstones   . GERD (gastroesophageal reflux disease)   . Hx of adenomatous polyp of colon 08/08/2014  . Hyperlipidemia   . Hypertension   . OBESITY   . Pneumonia     Past Surgical History:  Procedure Laterality Date  . CHOLECYSTECTOMY  2000  . COLONOSCOPY  2016   5 mm adenoma  . POLYPECTOMY    . TUBAL LIGATION  1998  . UPPER GASTROINTESTINAL ENDOSCOPY      Social History   Tobacco Use  . Smoking status: Never Smoker  . Smokeless tobacco: Never Used  Vaping Use  . Vaping Use: Never used  Substance Use Topics  . Alcohol use: Yes    Alcohol/week: 2.0 standard drinks    Types: 2 Glasses of wine per week    Comment: rare glass of wine   . Drug use: No    Family History  Problem Relation Age of Onset  .  Brain cancer Mother   . Hypertension Mother   . Diabetes Mother   . Diabetes Maternal Aunt   . Heart attack Maternal Grandmother   . Hypertension Maternal Grandmother   . Colon cancer Neg Hx   . Rectal cancer Neg Hx   . Stomach cancer Neg Hx   . Colon polyps Neg Hx   . Esophageal cancer Neg Hx     No Known Allergies  Medication list has been reviewed and updated.  Current Outpatient Medications on File Prior to Visit  Medication Sig Dispense Refill  . Black Cohosh 40 MG CAPS Take by mouth.    . losartan-hydrochlorothiazide (HYZAAR) 50-12.5 MG tablet Take 1 tablet by mouth daily. 90 tablet 3  . pantoprazole (PROTONIX) 20 MG tablet Take 1 tablet (20 mg total) by mouth daily.  (Patient not taking: Reported on 09/23/2019) 30 tablet 1  . pravastatin (PRAVACHOL) 20 MG tablet TAKE 1 TABLET(20 MG) BY MOUTH DAILY (Patient not taking: Reported on 09/23/2019) 90 tablet 3  . sertraline (ZOLOFT) 100 MG tablet Take 100 mg by mouth daily.     No current facility-administered medications on file prior to visit.    Review of Systems:  As per HPI- otherwise negative.   Physical Examination: Vitals:   01/06/20 1354  BP: 132/80  Pulse: 69  Resp: 17  SpO2: 99%   Vitals:   01/06/20 1354  Weight: 187 lb (84.8 kg)  Height: 5\' 6"  (1.676 m)   Body mass index is 30.18 kg/m. Ideal Body Weight: Weight in (lb) to have BMI = 25: 154.6  GEN: no acute distress.  Overweight, looks well and her normal self.  We are assisted today by some English interpreter HEENT: Atraumatic, Normocephalic.   Bilateral TM wnl, oropharynx normal.  PEERL,EOMI.   Ears and Nose: No external deformity. CV: RRR, No M/G/R. No JVD. No thrill. No extra heart sounds. PULM: CTA B, no wheezes, crackles, rhonchi. No retractions. No resp. distress. No accessory muscle use. ABD: S, NT, ND, +BS. No rebound. No HSM. EXTR: No c/c/e PSYCH: Normally interactive. Conversant.  She has some discomfort with manipulation and range of motion of the left shoulder.  Normal bilateral upper extremity strength.  She also has some tenderness in the left paracervical muscles She has normal cervical range of motion  Assessment and Plan: Essential hypertension  Primary hypertension - Plan: CBC, Comprehensive metabolic panel  Hyperlipidemia, unspecified hyperlipidemia type - Plan: Lipid panel  Controlled type 2 diabetes mellitus without complication, without long-term current use of insulin (H[facility]) - Plan: Comprehensive metabolic panel, Hemoglobin A1c, Pneumococcal polysaccharide vaccine 23-valent greater than or equal to 2yo subcutaneous/IM  Elevated liver enzymes - Plan: Comprehensive metabolic panel, Hepatitis,  Acute  Screening for thyroid disorder - Plan: TSH  Immunization due  Neck pain  Patient today with a couple of concerns Blood pressures under good control, continue current medication Routine labs pending as above Gave pneumonia vaccine Discussed her neck and shoulder pain.  She is interested in seeing a chiropractor for this concern, I gave her a few possible clinics close to her home.  She will let me know if a referral is necessary Follow-up on chronically elevated liver enzymes today Will plan further follow- up pending labs. Plan to visit in 6 months assuming all is well  This visit occurred during the SARS-CoV-2 public health emergency.  Safety protocols were in place, including screening questions prior to the visit, additional usage of staff PPE, and extensive cleaning of exam room  while observing appropriate contact time as indicated for disinfecting solutions.    Signed Lamar Blinks, MD  addnd 10/5- received labs  Results for orders placed or performed in visit on 01/06/20  CBC  Result Value Ref Range   WBC 7.4 3.8 - 10.8 Thousand/uL   RBC 4.65 3.80 - 5.10 Million/uL   Hemoglobin 14.2 11.7 - 15.5 g/dL   HCT 40.6 35 - 45 %   MCV 87.3 80.0 - 100.0 fL   MCH 30.5 27.0 - 33.0 pg   MCHC 35.0 32.0 - 36.0 g/dL   RDW 12.2 11.0 - 15.0 %   Platelets 238 140 - 400 Thousand/uL   MPV 10.6 7.5 - 12.5 fL  Comprehensive metabolic panel  Result Value Ref Range   Glucose, Bld 201 (H) 65 - 99 mg/dL   BUN 17 7 - 25 mg/dL   Creat 0.64 0.50 - 1.05 mg/dL   BUN/Creatinine Ratio NOT APPLICABLE 6 - 22 (calc)   Sodium 137 135 - 146 mmol/L   Potassium 3.7 3.5 - 5.3 mmol/L   Chloride 98 98 - 110 mmol/L   CO2 24 20 - 32 mmol/L   Calcium 10.6 (H) 8.6 - 10.4 mg/dL   Total Protein 7.7 6.1 - 8.1 g/dL   Albumin 4.7 3.6 - 5.1 g/dL   Globulin 3.0 1.9 - 3.7 g/dL (calc)   AG Ratio 1.6 1.0 - 2.5 (calc)   Total Bilirubin 0.4 0.2 - 1.2 mg/dL   Alkaline phosphatase (APISO) 77 37 - 153 U/L    AST 27 10 - 35 U/L   ALT 51 (H) 6 - 29 U/L  Hemoglobin A1c  Result Value Ref Range   Hgb A1c MFr Bld 6.4 (H) <5.7 % of total Hgb   Mean Plasma Glucose 137 (calc)   eAG (mmol/L) 7.6 (calc)  Lipid panel  Result Value Ref Range   Cholesterol 275 (H) <200 mg/dL   HDL 63 > OR = 50 mg/dL   Triglycerides 285 (H) <150 mg/dL   LDL Cholesterol (Calc) 165 (H) mg/dL (calc)   Total CHOL/HDL Ratio 4.4 <5.0 (calc)   Non-HDL Cholesterol (Calc) 212 (H) <130 mg/dL (calc)  TSH  Result Value Ref Range   TSH 3.22 0.40 - 4.50 mIU/L  Hepatitis, Acute  Result Value Ref Range   Hep A IgM NON-REACTIVE NON-REACTI   Hepatitis B Surface Ag NON-REACTIVE NON-REACTI   Hep B C IgM NON-REACTIVE NON-REACTI   Hepatitis C Ab NON-REACTIVE NON-REACTI   SIGNAL TO CUT-OFF 0.02 <1.00

## 2020-01-06 ENCOUNTER — Ambulatory Visit (INDEPENDENT_AMBULATORY_CARE_PROVIDER_SITE_OTHER): Payer: Medicare PPO | Admitting: Family Medicine

## 2020-01-06 ENCOUNTER — Other Ambulatory Visit: Payer: Self-pay

## 2020-01-06 ENCOUNTER — Encounter: Payer: Self-pay | Admitting: Family Medicine

## 2020-01-06 VITALS — BP 132/80 | HR 69 | Resp 17 | Ht 66.0 in | Wt 187.0 lb

## 2020-01-06 DIAGNOSIS — E785 Hyperlipidemia, unspecified: Secondary | ICD-10-CM | POA: Diagnosis not present

## 2020-01-06 DIAGNOSIS — I10 Essential (primary) hypertension: Secondary | ICD-10-CM

## 2020-01-06 DIAGNOSIS — E119 Type 2 diabetes mellitus without complications: Secondary | ICD-10-CM | POA: Diagnosis not present

## 2020-01-06 DIAGNOSIS — M542 Cervicalgia: Secondary | ICD-10-CM

## 2020-01-06 DIAGNOSIS — Z1329 Encounter for screening for other suspected endocrine disorder: Secondary | ICD-10-CM | POA: Diagnosis not present

## 2020-01-06 DIAGNOSIS — Z23 Encounter for immunization: Secondary | ICD-10-CM | POA: Diagnosis not present

## 2020-01-06 DIAGNOSIS — R748 Abnormal levels of other serum enzymes: Secondary | ICD-10-CM

## 2020-01-07 ENCOUNTER — Encounter: Payer: Self-pay | Admitting: Family Medicine

## 2020-01-07 LAB — LIPID PANEL
Cholesterol: 275 mg/dL — ABNORMAL HIGH (ref <200)
HDL: 63 mg/dL (ref >=50)
LDL Cholesterol (Calc): 165 mg/dL (calc) — ABNORMAL HIGH
Non-HDL Cholesterol (Calc): 212 mg/dL (calc) — ABNORMAL HIGH (ref <130)
Total CHOL/HDL Ratio: 4.4 (calc) (ref <5.0)
Triglycerides: 285 mg/dL — ABNORMAL HIGH (ref <150)

## 2020-01-07 LAB — COMPREHENSIVE METABOLIC PANEL
AG Ratio: 1.6 (calc) (ref 1.0–2.5)
ALT: 51 U/L — ABNORMAL HIGH (ref 6–29)
AST: 27 U/L (ref 10–35)
Albumin: 4.7 g/dL (ref 3.6–5.1)
Alkaline phosphatase (APISO): 77 U/L (ref 37–153)
BUN: 17 mg/dL (ref 7–25)
CO2: 24 mmol/L (ref 20–32)
Calcium: 10.6 mg/dL — ABNORMAL HIGH (ref 8.6–10.4)
Chloride: 98 mmol/L (ref 98–110)
Creat: 0.64 mg/dL (ref 0.50–1.05)
Globulin: 3 g/dL (calc) (ref 1.9–3.7)
Glucose, Bld: 201 mg/dL — ABNORMAL HIGH (ref 65–99)
Potassium: 3.7 mmol/L (ref 3.5–5.3)
Sodium: 137 mmol/L (ref 135–146)
Total Bilirubin: 0.4 mg/dL (ref 0.2–1.2)
Total Protein: 7.7 g/dL (ref 6.1–8.1)

## 2020-01-07 LAB — HEMOGLOBIN A1C
Hgb A1c MFr Bld: 6.4 % of total Hgb — ABNORMAL HIGH (ref <5.7)
Mean Plasma Glucose: 137 (calc)
eAG (mmol/L): 7.6 (calc)

## 2020-01-07 LAB — CBC
HCT: 40.6 % (ref 35.0–45.0)
Hemoglobin: 14.2 g/dL (ref 11.7–15.5)
MCH: 30.5 pg (ref 27.0–33.0)
MCHC: 35 g/dL (ref 32.0–36.0)
MCV: 87.3 fL (ref 80.0–100.0)
MPV: 10.6 fL (ref 7.5–12.5)
Platelets: 238 10*3/uL (ref 140–400)
RBC: 4.65 10*6/uL (ref 3.80–5.10)
RDW: 12.2 % (ref 11.0–15.0)
WBC: 7.4 10*3/uL (ref 3.8–10.8)

## 2020-01-07 LAB — HEPATITIS PANEL, ACUTE
Hep A IgM: NONREACTIVE
Hep B C IgM: NONREACTIVE
Hepatitis B Surface Ag: NONREACTIVE
Hepatitis C Ab: NONREACTIVE
SIGNAL TO CUT-OFF: 0.02 (ref <1.00)

## 2020-01-07 LAB — TSH: TSH: 3.22 mIU/L (ref 0.40–4.50)

## 2020-01-09 ENCOUNTER — Encounter: Payer: Self-pay | Admitting: Family Medicine

## 2020-01-09 ENCOUNTER — Other Ambulatory Visit: Payer: Self-pay | Admitting: Family Medicine

## 2020-01-13 ENCOUNTER — Emergency Department (HOSPITAL_BASED_OUTPATIENT_CLINIC_OR_DEPARTMENT_OTHER)
Admission: EM | Admit: 2020-01-13 | Discharge: 2020-01-13 | Disposition: A | Discharge status: Home / Self Care | Payer: Medicare PPO | Attending: Emergency Medicine | Admitting: Emergency Medicine

## 2020-01-13 ENCOUNTER — Other Ambulatory Visit: Payer: Self-pay

## 2020-01-13 ENCOUNTER — Other Ambulatory Visit (INDEPENDENT_AMBULATORY_CARE_PROVIDER_SITE_OTHER): Payer: Medicare PPO

## 2020-01-13 ENCOUNTER — Encounter (HOSPITAL_BASED_OUTPATIENT_CLINIC_OR_DEPARTMENT_OTHER): Payer: Self-pay | Admitting: Emergency Medicine

## 2020-01-13 DIAGNOSIS — I10 Essential (primary) hypertension: Secondary | ICD-10-CM | POA: Diagnosis not present

## 2020-01-13 DIAGNOSIS — Z79899 Other long term (current) drug therapy: Secondary | ICD-10-CM | POA: Insufficient documentation

## 2020-01-13 DIAGNOSIS — M25512 Pain in left shoulder: Secondary | ICD-10-CM | POA: Diagnosis not present

## 2020-01-13 DIAGNOSIS — E119 Type 2 diabetes mellitus without complications: Secondary | ICD-10-CM | POA: Diagnosis not present

## 2020-01-13 DIAGNOSIS — R0602 Shortness of breath: Secondary | ICD-10-CM | POA: Insufficient documentation

## 2020-01-13 LAB — COMPREHENSIVE METABOLIC PANEL
ALT: 52 U/L — ABNORMAL HIGH (ref 0–44)
AST: 35 U/L (ref 15–41)
Albumin: 4.7 g/dL (ref 3.5–5.0)
Alkaline Phosphatase: 69 U/L (ref 38–126)
Anion gap: 12 (ref 5–15)
BUN: 17 mg/dL (ref 6–20)
CO2: 25 mmol/L (ref 22–32)
Calcium: 10.2 mg/dL (ref 8.9–10.3)
Chloride: 102 mmol/L (ref 98–111)
Creatinine, Ser: 0.67 mg/dL (ref 0.44–1.00)
GFR, Estimated: >60 mL/min (ref >60)
Glucose, Bld: 120 mg/dL — ABNORMAL HIGH (ref 70–99)
Potassium: 3.7 mmol/L (ref 3.5–5.1)
Sodium: 139 mmol/L (ref 135–145)
Total Bilirubin: 0.5 mg/dL (ref 0.3–1.2)
Total Protein: 8.1 g/dL (ref 6.5–8.1)

## 2020-01-13 LAB — CBC
HCT: 41.1 % (ref 36.0–46.0)
Hemoglobin: 14.1 g/dL (ref 12.0–15.0)
MCH: 29.4 pg (ref 26.0–34.0)
MCHC: 34.3 g/dL (ref 30.0–36.0)
MCV: 85.8 fL (ref 80.0–100.0)
Platelets: 222 10*3/uL (ref 150–400)
RBC: 4.79 MIL/uL (ref 3.87–5.11)
RDW: 12.2 % (ref 11.5–15.5)
WBC: 7.1 10*3/uL (ref 4.0–10.5)
nRBC: 0 % (ref 0.0–0.2)

## 2020-01-13 LAB — TROPONIN I (HIGH SENSITIVITY): Troponin I (High Sensitivity): 5 ng/L (ref <18)

## 2020-01-13 LAB — LIPASE, BLOOD: Lipase: 34 U/L (ref 11–51)

## 2020-01-13 NOTE — ED Provider Notes (Signed)
[facility] EMERGENCY DEPARTMENT Provider Note   CSN: [id number] Arrival date & time: 01/13/20  0945     History Chief Complaint  Patient presents with  . Shoulder Pain    left   . Abdominal Pain    Claudia Gregory is a 59 y.o. female.  Patient is a 59 year old female with a history of bipolar disorder, deafness, diabetes, GERD, hypertension and hyperlipidemia who presents with shoulder pain.  She has been having some pain in her left upper shoulder for about the last week or so.  She describes a knot in her upper back.  She says the pain radiates down her left arm.  She denies any numbness or weakness to the arm.  It is not really worse with movement.  Its not worse with exertion.  When asked about shortness of breath, she does say that she gets short of breath with exertion but says that she feels like that is just from getting older and has been going on about a year.  She does not report any worsening of the symptoms.  She has no associated chest she had some nausea this morning and she says that occasionally she does get nauseated.  She denies any current nausea.  No associated abdominal pain.  No change in bowels.  No urinary symptoms.  She was concerned because her husband had a hard to take it age 68 and she is age 40.  She is concerned that her shoulder pain may be related to her heart.  History was obtained through an American sign language interpreter.        Past Medical History:  Diagnosis Date  . Anxiety   . Bipolar 1 disorder ([place])   . Carpal tunnel syndrome of left wrist   . Chest pain, atypical   . Deaf   . Depression   . Diabetes mellitus without complication ([place])    past hx- off meds   . FATIGUE   . Gallstones   . GERD (gastroesophageal reflux disease)   . Hx of adenomatous polyp of colon 08/08/2014  . Hyperlipidemia   . Hypertension   . OBESITY   . Pneumonia     Patient Active Problem List   Diagnosis Date Noted  . Controlled type 2 diabetes  mellitus without complication, without long-term current use of insulin ([place]) 07/11/2019  . Generalized anxiety disorder 12/28/2015  . Elevated liver enzymes 12/28/2015  . Plantar fasciitis of right foot 04/01/2015  . Neuropathic pain of both feet 10/27/2014  . Hx of adenomatous polyp of colon 08/08/2014  . Deafness 08/04/2014  . GERD (gastroesophageal reflux disease) 05/13/2014  . Hyperlipidemia 02/05/2013  . Hot flashes 11/24/2012  . OBESITY 10/23/2008  . Hypertension 10/23/2008  . BIPOLAR DISORDER UNSPECIFIED 09/02/2008    Past Surgical History:  Procedure Laterality Date  . CHOLECYSTECTOMY  2000  . COLONOSCOPY  2016   5 mm adenoma  . POLYPECTOMY    . TUBAL LIGATION  1998  . UPPER GASTROINTESTINAL ENDOSCOPY       OB History   No obstetric history on file.     Family History  Problem Relation Age of Onset  . Brain cancer Mother   . Hypertension Mother   . Diabetes Mother   . Diabetes Maternal Aunt   . Heart attack Maternal Grandmother   . Hypertension Maternal Grandmother   . Colon cancer Neg Hx   . Rectal cancer Neg Hx   . Stomach cancer Neg Hx   . Colon  polyps Neg Hx   . Esophageal cancer Neg Hx     Social History   Tobacco Use  . Smoking status: Never Smoker  . Smokeless tobacco: Never Used  Vaping Use  . Vaping Use: Never used  Substance Use Topics  . Alcohol use: Yes    Alcohol/week: 2.0 standard drinks    Types: 2 Glasses of wine per week    Comment: rare glass of wine   . Drug use: No    Home Medications Prior to Admission medications   Medication Sig Start Date End Date Taking? Authorizing Provider  Black Cohosh 40 MG CAPS Take by mouth.    [provider]  losartan-hydrochlorothiazide (HYZAAR) 50-12.5 MG tablet Take 1 tablet by mouth daily. 07/11/19   Copland, Gay Filler, MD  pantoprazole (PROTONIX) 20 MG tablet Take 1 tablet (20 mg total) by mouth daily. Patient not taking: Reported on 09/23/2019 12/08/18   Charlesetta Shanks, MD    pravastatin (PRAVACHOL) 20 MG tablet TAKE 1 TABLET(20 MG) BY MOUTH DAILY Patient not taking: Reported on 09/23/2019 04/25/18   Copland, Gay Filler, MD  sertraline (ZOLOFT) 100 MG tablet Take 100 mg by mouth daily.    [provider]    Allergies    Patient has no known allergies.  Review of Systems   Review of Systems  Constitutional: Negative for chills, diaphoresis, fatigue and fever.  HENT: Negative for congestion, rhinorrhea and sneezing.   Eyes: Negative.   Respiratory: Positive for shortness of breath. Negative for cough and chest tightness.   Cardiovascular: Negative for chest pain and leg swelling.  Gastrointestinal: Positive for nausea. Negative for abdominal pain, blood in stool, diarrhea and vomiting.  Genitourinary: Negative for difficulty urinating, flank pain, frequency and hematuria.  Musculoskeletal: Positive for arthralgias. Negative for back pain.  Skin: Negative for rash.  Neurological: Negative for dizziness, speech difficulty, weakness, numbness and headaches.    Physical Exam Updated Vital Signs BP (!) 149/85   Pulse 60   Temp 98 F (36.7 C) (Oral)   Resp 18   Ht 5\' 6"  (1.676 m)   Wt 83.9 kg   LMP 10/06/2015 (Approximate)   SpO2 99%   BMI 29.86 kg/m   Physical Exam Constitutional:      Appearance: She is well-developed.  HENT:     Head: Normocephalic and atraumatic.  Eyes:     Pupils: Pupils are equal, round, and reactive to light.  Cardiovascular:     Rate and Rhythm: Normal rate and regular rhythm.     Heart sounds: Normal heart sounds.  Pulmonary:     Effort: Pulmonary effort is normal. No respiratory distress.     Breath sounds: Normal breath sounds. No wheezing or rales.  Chest:     Chest wall: No tenderness.  Abdominal:     General: Bowel sounds are normal.     Palpations: Abdomen is soft.     Tenderness: There is no abdominal tenderness. There is no guarding or rebound.  Musculoskeletal:        General: Normal range of  motion.     Cervical back: Normal range of motion and neck supple.     Comments: She does have a left upper back just medial to the scapula.  There is no spinal tenderness.  No bony tenderness on palpation or range of motion of her shoulder.  There is some mild reproducible pain on abduction of her shoulder.  She has normal sensation and motor function distally.  Lymphadenopathy:  Cervical: No cervical adenopathy.  Skin:    General: Skin is warm and dry.     Findings: No rash.  Neurological:     Mental Status: She is alert and oriented to person, place, and time.     ED Results / Procedures / Treatments   Labs (all labs ordered are listed, but only abnormal results are displayed) Labs Reviewed  COMPREHENSIVE METABOLIC PANEL - Abnormal; Notable for the following components:      Result Value   Glucose, Bld 120 (*)    ALT 52 (*)    All other components within normal limits  LIPASE, BLOOD  CBC  TROPONIN I (HIGH SENSITIVITY)    EKG None ED ECG REPORT   Date: 01/13/2020  Rate: 61  Rhythm: normal sinus rhythm  QRS Axis: normal  Intervals: normal  ST/T Wave abnormalities: normal  Conduction Disutrbances:none  Narrative Interpretation:   Old EKG Reviewed: unchanged  I have personally reviewed the EKG tracing and agree with the computerized printout as noted.  Radiology No results found.  Procedures Procedures (including critical care time)  Medications Ordered in ED Medications - No data to display  ED Course  I have reviewed the triage vital signs and the nursing notes.  Pertinent labs & imaging results that were available during my care of the patient were reviewed by me and considered in my medical decision making (see chart for details).    MDM Rules/Calculators/A&P                          Patient is a 59 year old female who presents with pain in her left shoulder.  She has an area of reproducibility just medial to the scapula on the left side.  There is no  bony tenderness of the shoulder itself.  I did not feel that imaging studies were warranted.  No recent trauma.  She was concerned about ACS.  She reported some vague shortness of breath but it does not seem like her breathing is any different than her baseline.  She did have an EKG which showed no ischemic changes.  Her troponin is normal.  Her other labs are similar to prior values.  She was discharged home in good condition.  She is already discussed with her PCP getting chiropractic care for her shoulder pain.  I also advised her to use ibuprofen or Aleve for symptomatic treatment.  She will follow back up with her PCP if her symptoms are not improving.  Return precautions were given. Final Clinical Impression(s) / ED Diagnoses Final diagnoses:  Acute pain of left shoulder    Rx / DC Orders ED Discharge Orders    None       Malvin Johns, MD 01/13/20 1305

## 2020-01-13 NOTE — ED Triage Notes (Signed)
Reports left shoulder/ upper back pain x days. Also abd pain / N V this Am. Sts concerns about cardiac issues, have a grandmother died of heart attack ar 59 yrs old.

## 2020-01-13 NOTE — ED Notes (Signed)
AVS and DC instructions provided to pt using ASL Interpreter Pad for sign language, Rusty for services provided interpretation. Copy of AVS provided to pt as well, with opportunity for questions while using the Interpreter pad.

## 2020-01-14 ENCOUNTER — Encounter: Payer: Self-pay | Admitting: Family Medicine

## 2020-01-14 LAB — PTH, INTACT AND CALCIUM
Calcium: 10.1 mg/dL (ref 8.6–10.4)
PTH: 37 pg/mL (ref 14–64)

## 2020-03-31 ENCOUNTER — Telehealth: Payer: Self-pay | Admitting: Family Medicine

## 2020-03-31 NOTE — Telephone Encounter (Signed)
[facility] # [phone]  Verbal Order   Need authorization for outpatient mental health services along with NPI.  Ok to leave message

## 2020-03-31 NOTE — Telephone Encounter (Signed)
Verbal order given to [facility].

## 2020-05-19 DIAGNOSIS — F319 Bipolar disorder, unspecified: Secondary | ICD-10-CM | POA: Diagnosis not present

## 2020-07-18 NOTE — Patient Instructions (Addendum)
Great to see you again today!  Take care and please see me in 6 months assuming all is well Please stop by and schedule your mammogram on the ground floor at imaging  You can also get the shingles vaccine, and 4th dose of covid 19 at your convenience - Audry Pili can get the 4th covid dose as well    Health Maintenance, Female Adopting a healthy lifestyle and getting preventive care are important in promoting health and wellness. Ask your health care provider about:  The right schedule for you to have regular tests and exams.  Things you can do on your own to prevent diseases and keep yourself healthy. What should I know about diet, weight, and exercise? Eat a healthy diet  Eat a diet that includes plenty of vegetables, fruits, low-fat dairy products, and lean protein.  Do not eat a lot of foods that are high in solid fats, added sugars, or sodium.   Maintain a healthy weight Body mass index (BMI) is used to identify weight problems. It estimates body fat based on height and weight. Your health care provider can help determine your BMI and help you achieve or maintain a healthy weight. Get regular exercise Get regular exercise. This is one of the most important things you can do for your health. Most adults should:  Exercise for at least 150 minutes each week. The exercise should increase your heart rate and make you sweat (moderate-intensity exercise).  Do strengthening exercises at least twice a week. This is in addition to the moderate-intensity exercise.  Spend less time sitting. Even light physical activity can be beneficial. Watch cholesterol and blood lipids Have your blood tested for lipids and cholesterol at 58 years of age, then have this test every 5 years. Have your cholesterol levels checked more often if:  Your lipid or cholesterol levels are high.  You are older than 58 years of age.  You are at high risk for heart disease. What should I know about cancer  screening? Depending on your health history and family history, you may need to have cancer screening at various ages. This may include screening for:  Breast cancer.  Cervical cancer.  Colorectal cancer.  Skin cancer.  Lung cancer. What should I know about heart disease, diabetes, and high blood pressure? Blood pressure and heart disease  High blood pressure causes heart disease and increases the risk of stroke. This is more likely to develop in people who have high blood pressure readings, are of African descent, or are overweight.  Have your blood pressure checked: ? Every 3-5 years if you are 39-18 years of age. ? Every year if you are 71 years old or older. Diabetes Have regular diabetes screenings. This checks your fasting blood sugar level. Have the screening done:  Once every three years after age 45 if you are at a normal weight and have a low risk for diabetes.  More often and at a younger age if you are overweight or have a high risk for diabetes. What should I know about preventing infection? Hepatitis B If you have a higher risk for hepatitis B, you should be screened for this virus. Talk with your health care provider to find out if you are at risk for hepatitis B infection. Hepatitis C Testing is recommended for:  Everyone born from [birth year] through 1963.  Anyone with known risk factors for hepatitis C. Sexually transmitted infections (STIs)  Get screened for STIs, including gonorrhea and chlamydia, if: ? You  are sexually active and are younger than 58 years of age. ? You are older than 58 years of age and your health care provider tells you that you are at risk for this type of infection. ? Your sexual activity has changed since you were last screened, and you are at increased risk for chlamydia or gonorrhea. Ask your health care provider if you are at risk.  Ask your health care provider about whether you are at high risk for HIV. Your health care provider may  recommend a prescription medicine to help prevent HIV infection. If you choose to take medicine to prevent HIV, you should first get tested for HIV. You should then be tested every 3 months for as long as you are taking the medicine. Pregnancy  If you are about to stop having your period (premenopausal) and you may become pregnant, seek counseling before you get pregnant.  Take 400 to 800 micrograms (mcg) of folic acid every day if you become pregnant.  Ask for birth control (contraception) if you want to prevent pregnancy. Osteoporosis and menopause Osteoporosis is a disease in which the bones lose minerals and strength with aging. This can result in bone fractures. If you are 63 years old or older, or if you are at risk for osteoporosis and fractures, ask your health care provider if you should:  Be screened for bone loss.  Take a calcium or vitamin D supplement to lower your risk of fractures.  Be given hormone replacement therapy (HRT) to treat symptoms of menopause. Follow these instructions at home: Lifestyle  Do not use any products that contain nicotine or tobacco, such as cigarettes, e-cigarettes, and chewing tobacco. If you need help quitting, ask your health care provider.  Do not use street drugs.  Do not share needles.  Ask your health care provider for help if you need support or information about quitting drugs. Alcohol use  Do not drink alcohol if: ? Your health care provider tells you not to drink. ? You are pregnant, may be pregnant, or are planning to become pregnant.  If you drink alcohol: ? Limit how much you use to 0-1 drink a day. ? Limit intake if you are breastfeeding.  Be aware of how much alcohol is in your drink. In the [place] one drink equals one 12 oz bottle of beer (355 mL), one 5 oz glass of wine (148 mL), or one 1 oz glass of hard liquor (44 mL). General instructions  Schedule regular health, dental, and eye exams.  Stay current with your  vaccines.  Tell your health care provider if: ? You often feel depressed. ? You have ever been abused or do not feel safe at home. Summary  Adopting a healthy lifestyle and getting preventive care are important in promoting health and wellness.  Follow your health care provider's instructions about healthy diet, exercising, and getting tested or screened for diseases.  Follow your health care provider's instructions on monitoring your cholesterol and blood pressure. This information is not intended to replace advice given to you by your health care provider. Make sure you discuss any questions you have with your health care provider. Document Revised: 03/14/2018 Document Reviewed: 03/14/2018 Elsevier Patient Education  2021 [employer].

## 2020-07-18 NOTE — Progress Notes (Addendum)
[facility] at [facility] [place], [place], [place] [address] [phone]  Date:  07/22/2020   Name:  Claudia Gregory   DOB:  [birth year]   MRN:  [id number]  PCP:  Darreld Mclean, MD    Chief Complaint: Annual Exam and Fatigue Marland KitchenKalman Drape stress, shortness of breath, fatigue, no energy, unsure if hyperglycemia is causing the fatigue)   History of Present Illness:  Claudia Gregory is a 60 y.o. very pleasant female patient who presents with the following:  Here today for a CPE Last seen by myself in October History of deafness, hypertension, GERD, hyperlipidemia, obesity, peripheral neuropathy, diabetes, fatty liver with elevation of LFT Married to [place] who is also here today  Also seen with a ASL interpreter  She notes that she tends to feel tired, and she can get anxious sometimes  She is on zoloft- 100 mg; she would be interested in going up to 150 She notes that their adult son is living with them again, this is the main source of her anxiety.  Eye exam covid booster- done -can have fourth dose Can recheck A1c today mammo due-ordered for patient to Pap 2019- can update today if she likes  Shingirx: We plan to give today, the patient has Medicare.  She will get this done at her pharmacy  Lab Results  Component Value Date   HGBA1C 6.4 (H) 01/06/2020     Patient Active Problem List   Diagnosis Date Noted  . Controlled type 2 diabetes mellitus without complication, without long-term current use of insulin ([place]) 07/11/2019  . Generalized anxiety disorder 12/28/2015  . Elevated liver enzymes 12/28/2015  . Plantar fasciitis of right foot 04/01/2015  . Neuropathic pain of both feet 10/27/2014  . Hx of adenomatous polyp of colon 08/08/2014  . Deafness 08/04/2014  . GERD (gastroesophageal reflux disease) 05/13/2014  . Hyperlipidemia 02/05/2013  . Hot flashes 11/24/2012  . OBESITY 10/23/2008  . Hypertension 10/23/2008   . BIPOLAR DISORDER UNSPECIFIED 09/02/2008    Past Medical History:  Diagnosis Date  . Anxiety   . Bipolar 1 disorder ([place])   . Carpal tunnel syndrome of left wrist   . Chest pain, atypical   . Deaf   . Depression   . Diabetes mellitus without complication ([place])    past hx- off meds   . FATIGUE   . Gallstones   . GERD (gastroesophageal reflux disease)   . Hx of adenomatous polyp of colon 08/08/2014  . Hyperlipidemia   . Hypertension   . OBESITY   . Pneumonia     Past Surgical History:  Procedure Laterality Date  . CHOLECYSTECTOMY  2000  . COLONOSCOPY  2016   5 mm adenoma  . POLYPECTOMY    . TUBAL LIGATION  1998  . UPPER GASTROINTESTINAL ENDOSCOPY      Social History   Tobacco Use  . Smoking status: Never Smoker  . Smokeless tobacco: Never Used  Vaping Use  . Vaping Use: Never used  Substance Use Topics  . Alcohol use: Yes    Alcohol/week: 2.0 standard drinks    Types: 2 Glasses of wine per week    Comment: rare glass of wine   . Drug use: No    Family History  Problem Relation Age of Onset  . Brain cancer Mother   . Hypertension Mother   . Diabetes Mother   . Diabetes Maternal Aunt   . Heart attack Maternal Grandmother   .  Hypertension Maternal Grandmother   . Colon cancer Neg Hx   . Rectal cancer Neg Hx   . Stomach cancer Neg Hx   . Colon polyps Neg Hx   . Esophageal cancer Neg Hx     No Known Allergies  Medication list has been reviewed and updated.  Current Outpatient Medications on File Prior to Visit  Medication Sig Dispense Refill  . Black Cohosh 40 MG CAPS Take by mouth.    . losartan-hydrochlorothiazide (HYZAAR) 50-12.5 MG tablet Take 1 tablet by mouth daily. 90 tablet 3  . pravastatin (PRAVACHOL) 20 MG tablet TAKE 1 TABLET(20 MG) BY MOUTH DAILY 90 tablet 3   No current facility-administered medications on file prior to visit.    Review of Systems:  As per HPI- otherwise negative.   Physical Examination: Vitals:   07/22/20 1408   BP: 126/80  Pulse: 64  Resp: 18  Temp: 97.8 F (36.6 C)  SpO2: 98%   Vitals:   07/22/20 1408  Weight: 190 lb (86.2 kg)  Height: 5\' 6"  (1.676 m)   Body mass index is 30.67 kg/m. Ideal Body Weight: Weight in (lb) to have BMI = 25: 154.6  GEN: no acute distress.  Obese, otherwise looks well HEENT: Atraumatic, Normocephalic.  Ears and Nose: No external deformity. CV: RRR, No M/G/R. No JVD. No thrill. No extra heart sounds. PULM: CTA B, no wheezes, crackles, rhonchi. No retractions. No resp. distress. No accessory muscle use. ABD: S, NT, ND, +BS. No rebound. No HSM. EXTR: No c/c/e PSYCH: Normally interactive. Conversant.  Pelvic: normal, no vaginal lesions or discharge. Uterus normal, no CMT, no adnexal tendereness or masses    Assessment and Plan: Physical exam  Essential hypertension  Primary hypertension  Controlled type 2 diabetes mellitus without complication, without long-term current use of insulin ([place]) - Plan: Comprehensive metabolic panel, Hemoglobin A1c  Elevated liver enzymes - Plan: Comprehensive metabolic panel  Hyperlipidemia, unspecified hyperlipidemia type  Immunization due  Encounter for screening mammogram for malignant neoplasm of breast - Plan: MM 3D SCREEN BREAST BILATERAL  Generalized anxiety disorder - Plan: sertraline (ZOLOFT) 100 MG tablet  Screening for cervical cancer - Plan: Cytology - PAP  Here today for a physical exam.  Pap completed as above, routine labs are ordered Ordered mammogram Encouraged shingles vaccine and COVID fourth dose Blood pressure under good control We will try increasing her sertraline to 150 mg, I have asked her to let me know how this works for her Will plan further follow- up pending labs. Encouraged healthy diet and exercise routine This visit occurred during the SARS-CoV-2 public health emergency.  Safety protocols were in place, including screening questions prior to the visit, additional usage of staff  PPE, and extensive cleaning of exam room while observing appropriate contact time as indicated for disinfecting solutions.     Signed Lamar Blinks, MD   Addendum 4/21, received her labs as below-message to patient A1c has gone up by 1.4 points Results for orders placed or performed in visit on 07/22/20  Comprehensive metabolic panel  Result Value Ref Range   Sodium 135 135 - 145 mEq/L   Potassium 4.2 3.5 - 5.1 mEq/L   Chloride 97 96 - 112 mEq/L   CO2 30 19 - 32 mEq/L   Glucose, Bld 136 (H) 70 - 99 mg/dL   BUN 13 6 - 23 mg/dL   Creatinine, Ser 0.60 0.40 - 1.20 mg/dL   Total Bilirubin 0.4 0.2 - 1.2 mg/dL   Alkaline Phosphatase  74 39 - 117 U/L   AST 33 0 - 37 U/L   ALT 60 (H) 0 - 35 U/L   Total Protein 7.8 6.0 - 8.3 g/dL   Albumin 4.6 3.5 - 5.2 g/dL   GFR 99.65 >60.00 mL/min   Calcium 10.7 (H) 8.4 - 10.5 mg/dL  Hemoglobin A1c  Result Value Ref Range   Hgb A1c MFr Bld 7.8 (H) 4.6 - 6.5 %

## 2020-07-22 ENCOUNTER — Encounter: Payer: Medicare PPO | Admitting: Family Medicine

## 2020-07-22 ENCOUNTER — Other Ambulatory Visit (HOSPITAL_BASED_OUTPATIENT_CLINIC_OR_DEPARTMENT_OTHER): Payer: Self-pay

## 2020-07-22 ENCOUNTER — Encounter: Payer: Self-pay | Admitting: Family Medicine

## 2020-07-22 ENCOUNTER — Other Ambulatory Visit: Payer: Self-pay

## 2020-07-22 ENCOUNTER — Other Ambulatory Visit (HOSPITAL_COMMUNITY)
Admission: RE | Admit: 2020-07-22 | Discharge: 2020-07-22 | Disposition: A | Discharge status: Home / Self Care | Payer: Medicare PPO | Source: Ambulatory Visit | Attending: Family Medicine | Admitting: Family Medicine

## 2020-07-22 ENCOUNTER — Ambulatory Visit (INDEPENDENT_AMBULATORY_CARE_PROVIDER_SITE_OTHER): Payer: Medicare PPO | Admitting: Family Medicine

## 2020-07-22 VITALS — BP 126/80 | HR 64 | Temp 97.8°F | Resp 18 | Ht 66.0 in | Wt 190.0 lb

## 2020-07-22 DIAGNOSIS — E119 Type 2 diabetes mellitus without complications: Secondary | ICD-10-CM | POA: Diagnosis not present

## 2020-07-22 DIAGNOSIS — R748 Abnormal levels of other serum enzymes: Secondary | ICD-10-CM | POA: Diagnosis not present

## 2020-07-22 DIAGNOSIS — Z1231 Encounter for screening mammogram for malignant neoplasm of breast: Secondary | ICD-10-CM

## 2020-07-22 DIAGNOSIS — Z124 Encounter for screening for malignant neoplasm of cervix: Secondary | ICD-10-CM | POA: Insufficient documentation

## 2020-07-22 DIAGNOSIS — I10 Essential (primary) hypertension: Secondary | ICD-10-CM | POA: Diagnosis not present

## 2020-07-22 DIAGNOSIS — F411 Generalized anxiety disorder: Secondary | ICD-10-CM | POA: Diagnosis not present

## 2020-07-22 DIAGNOSIS — Z23 Encounter for immunization: Secondary | ICD-10-CM

## 2020-07-22 DIAGNOSIS — Z0000 Encounter for general adult medical examination without abnormal findings: Secondary | ICD-10-CM | POA: Diagnosis not present

## 2020-07-22 DIAGNOSIS — Z1151 Encounter for screening for human papillomavirus (HPV): Secondary | ICD-10-CM | POA: Insufficient documentation

## 2020-07-22 DIAGNOSIS — E785 Hyperlipidemia, unspecified: Secondary | ICD-10-CM

## 2020-07-22 MED ORDER — SHINGRIX 50 MCG/0.5ML IM SUSR
INTRAMUSCULAR | 0 refills | Status: DC
  Filled 2020-07-22: qty 0.5, 1d supply, fill #0

## 2020-07-22 MED ORDER — SERTRALINE HCL 100 MG PO TABS: 150.0000 mg | ORAL_TABLET | Freq: Every day | ORAL | 3 refills | Status: DC

## 2020-07-23 ENCOUNTER — Other Ambulatory Visit (HOSPITAL_COMMUNITY): Payer: Self-pay

## 2020-07-23 ENCOUNTER — Encounter: Payer: Self-pay | Admitting: Family Medicine

## 2020-07-23 DIAGNOSIS — R87610 Atypical squamous cells of undetermined significance on cytologic smear of cervix (ASC-US): Secondary | ICD-10-CM | POA: Diagnosis not present

## 2020-07-23 LAB — COMPREHENSIVE METABOLIC PANEL
ALT: 60 U/L — ABNORMAL HIGH (ref 0–35)
AST: 33 U/L (ref 0–37)
Albumin: 4.6 g/dL (ref 3.5–5.2)
Alkaline Phosphatase: 74 U/L (ref 39–117)
BUN: 13 mg/dL (ref 6–23)
CO2: 30 mEq/L (ref 19–32)
Calcium: 10.7 mg/dL — ABNORMAL HIGH (ref 8.4–10.5)
Chloride: 97 mEq/L (ref 96–112)
Creatinine, Ser: 0.6 mg/dL (ref 0.40–1.20)
GFR: 99.65 mL/min (ref >60.00)
Glucose, Bld: 136 mg/dL — ABNORMAL HIGH (ref 70–99)
Potassium: 4.2 mEq/L (ref 3.5–5.1)
Sodium: 135 mEq/L (ref 135–145)
Total Bilirubin: 0.4 mg/dL (ref 0.2–1.2)
Total Protein: 7.8 g/dL (ref 6.0–8.3)

## 2020-07-23 LAB — HEMOGLOBIN A1C: Hgb A1c MFr Bld: 7.8 % — ABNORMAL HIGH (ref 4.6–6.5)

## 2020-07-23 NOTE — Addendum Note (Signed)
Addended by: Lamar Blinks C on: 07/23/2020 12:52 PM   Modules accepted: Orders

## 2020-07-27 ENCOUNTER — Other Ambulatory Visit: Payer: Self-pay

## 2020-07-27 ENCOUNTER — Encounter: Payer: Self-pay | Admitting: Family Medicine

## 2020-07-27 ENCOUNTER — Ambulatory Visit (HOSPITAL_BASED_OUTPATIENT_CLINIC_OR_DEPARTMENT_OTHER)
Admission: RE | Admit: 2020-07-27 | Discharge: 2020-07-27 | Disposition: A | Discharge status: Home / Self Care | Payer: Medicare PPO | Source: Ambulatory Visit | Attending: Family Medicine | Admitting: Family Medicine

## 2020-07-27 DIAGNOSIS — R748 Abnormal levels of other serum enzymes: Secondary | ICD-10-CM

## 2020-07-27 DIAGNOSIS — Z1231 Encounter for screening mammogram for malignant neoplasm of breast: Secondary | ICD-10-CM | POA: Insufficient documentation

## 2020-07-27 DIAGNOSIS — R945 Abnormal results of liver function studies: Secondary | ICD-10-CM | POA: Diagnosis not present

## 2020-07-27 LAB — CYTOLOGY - PAP
Comment: NEGATIVE
Diagnosis: UNDETERMINED — AB
High risk HPV: NEGATIVE

## 2020-07-28 ENCOUNTER — Encounter: Payer: Self-pay | Admitting: Family Medicine

## 2020-08-05 IMAGING — MG DIGITAL SCREENING BILAT W/ TOMO W/ CAD
8 series · 8 of 24 positions shown · non-contrast
Comparison: Previous exam(s).

CLINICAL DATA: Screening.

EXAM:
DIGITAL SCREENING BILATERAL MAMMOGRAM WITH TOMO AND CAD

[R MLO synth-2D]
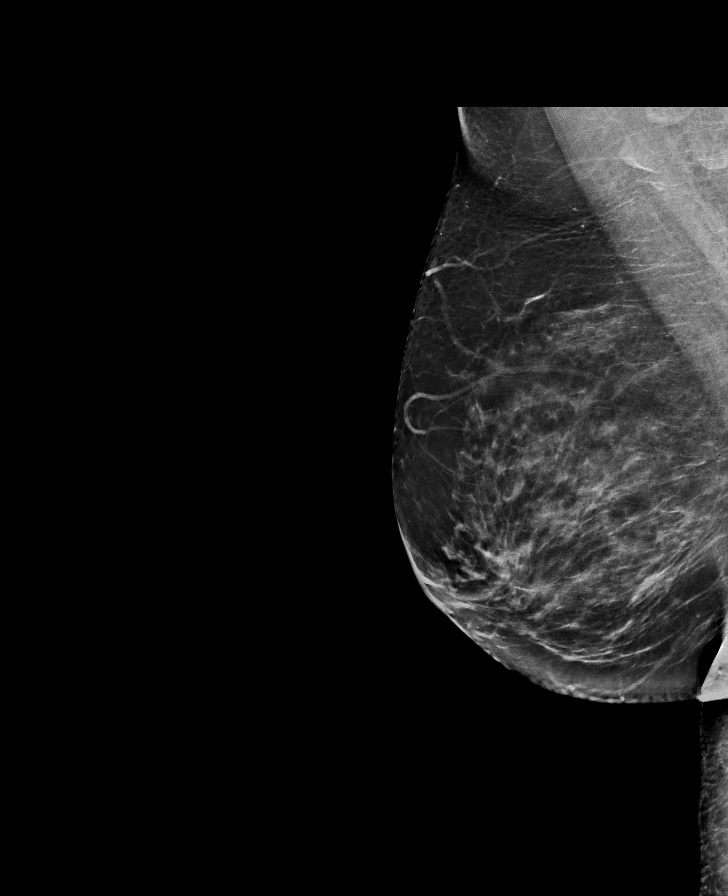

[L MLO synth-2D]
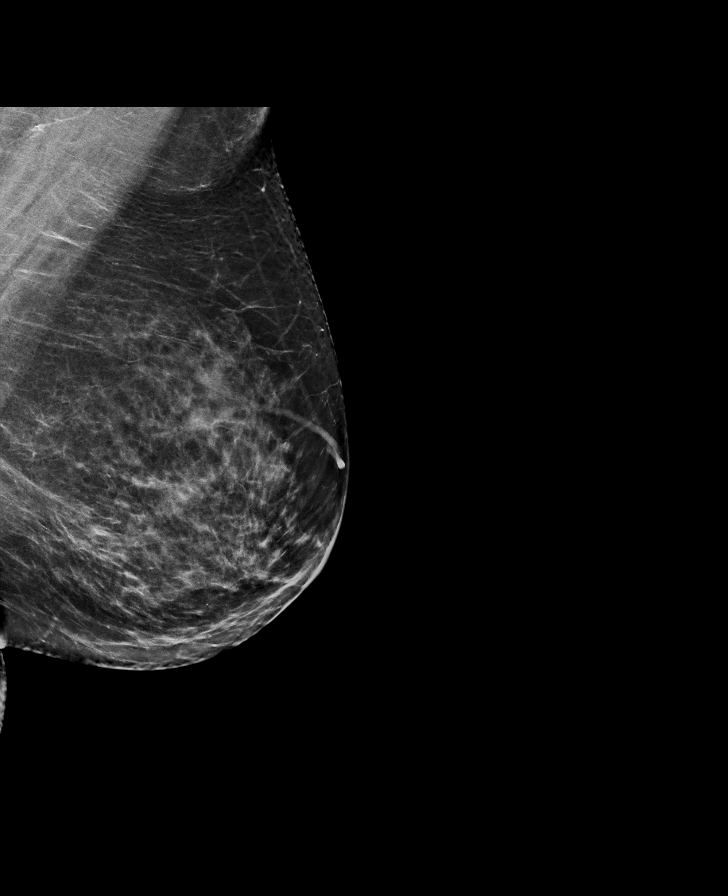

[L CC synth-2D]
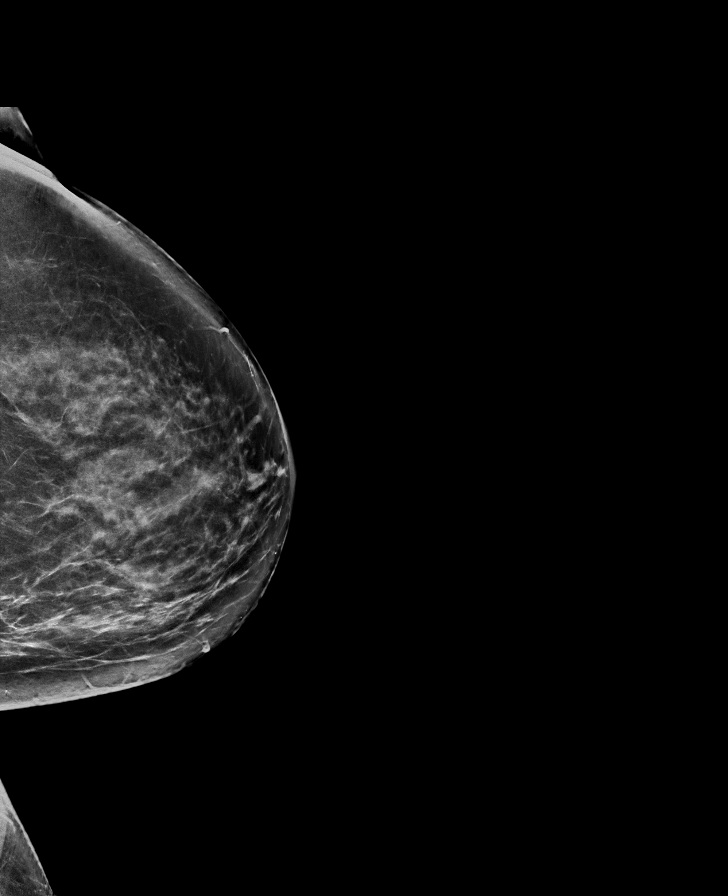

[R CC synth-2D]
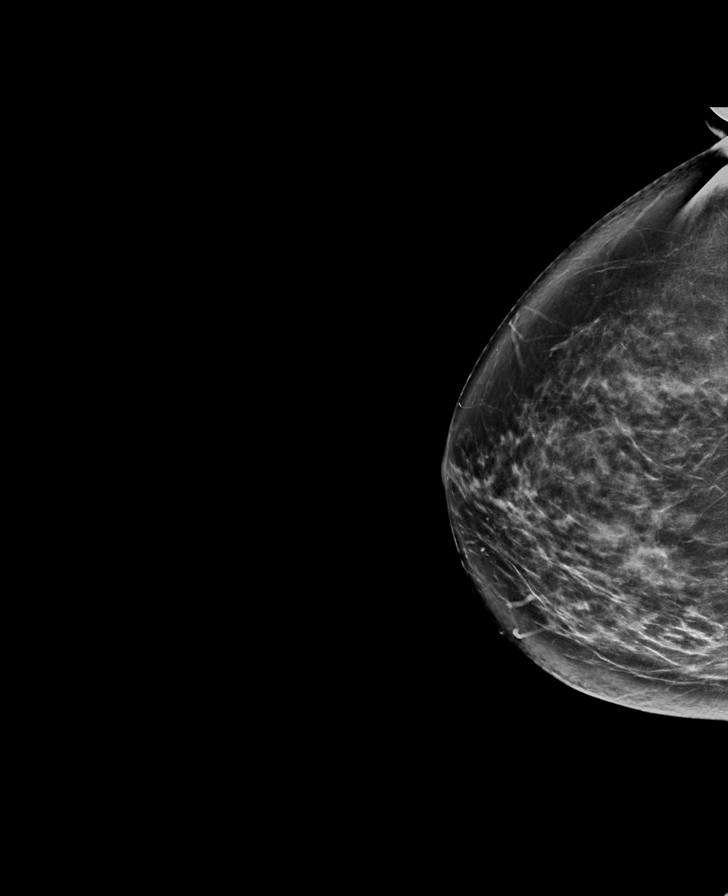

[L MLO tomo · tomo slice 41/82.0]
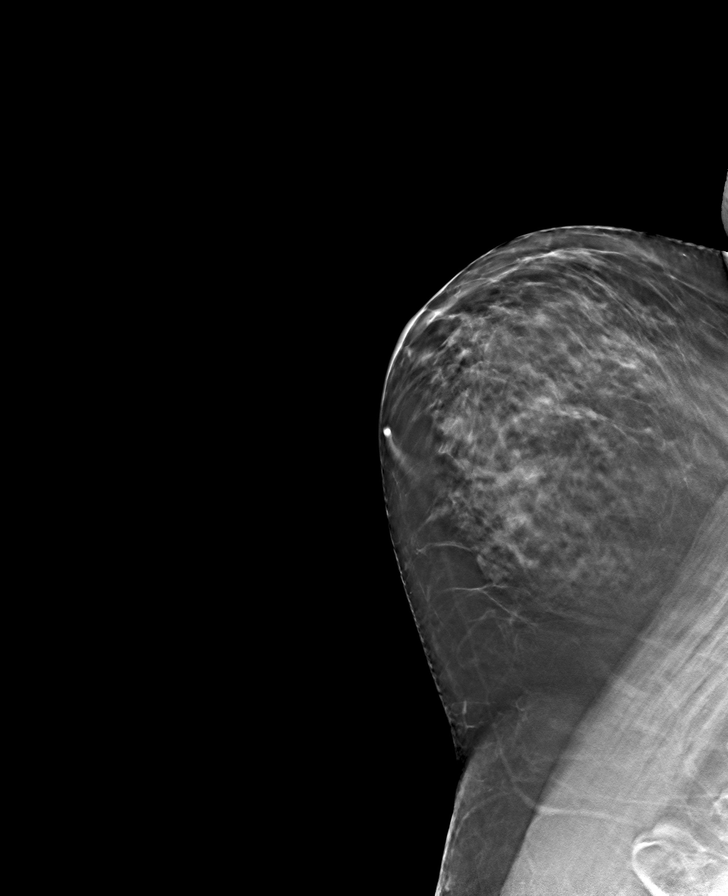

[R MLO tomo · tomo slice 41/82.0]
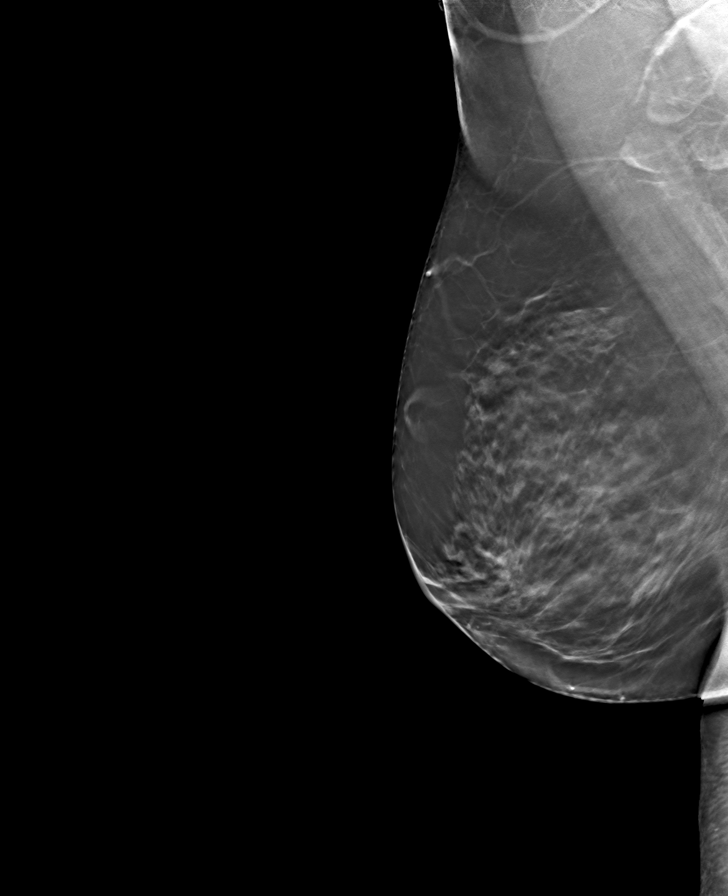

[L CC tomo · tomo slice 39/78.0]
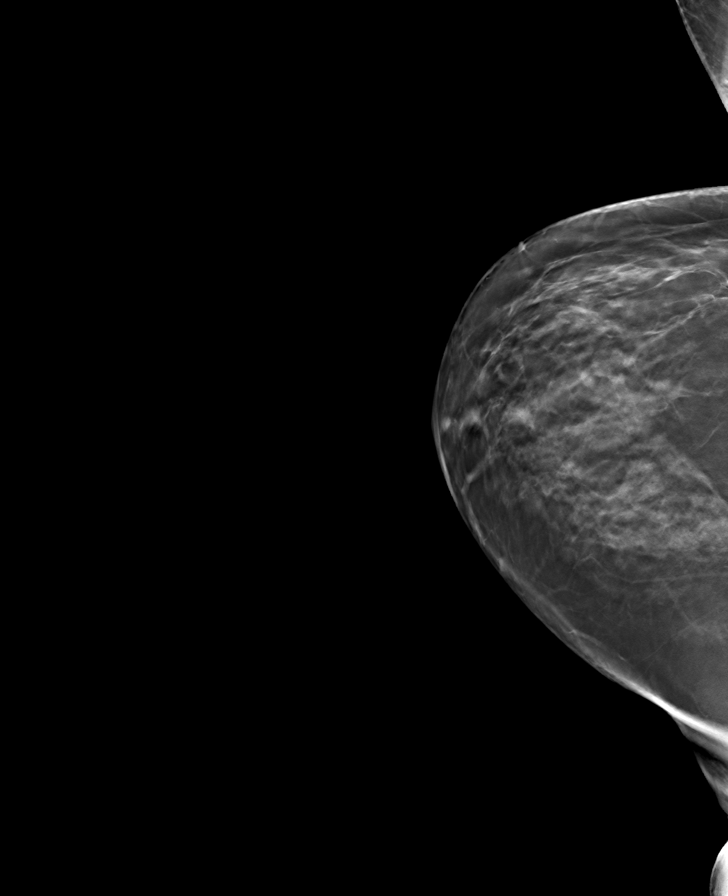

[R CC tomo · tomo slice 40/79.0]
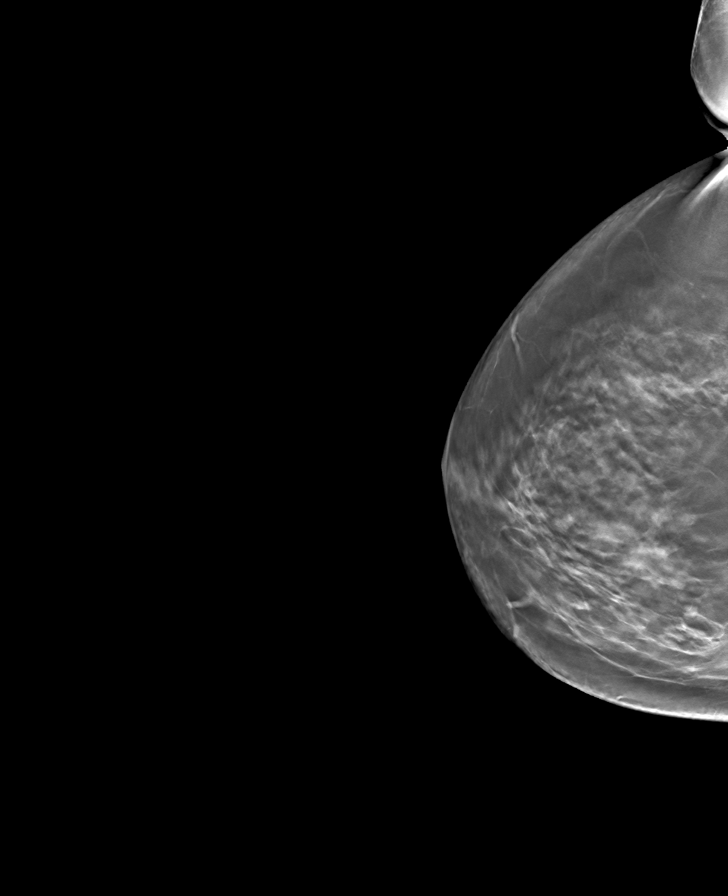

[8 of 24 positions shown; findings below may reference images not displayed]

ACR Breast Density Category c: The breast tissue is heterogeneously
dense, which may obscure small masses.
FINDINGS: There are no findings suspicious for malignancy. Images were
processed with CAD.
IMPRESSION: No mammographic evidence of malignancy. A result letter of this
screening mammogram will be mailed directly to the patient.

RECOMMENDATION:
Screening mammogram in one year. (Code:FT-U-LHB)

BI-RADS CATEGORY  1: Negative.

## 2020-09-02 ENCOUNTER — Other Ambulatory Visit: Payer: Medicare PPO

## 2020-09-03 ENCOUNTER — Other Ambulatory Visit (INDEPENDENT_AMBULATORY_CARE_PROVIDER_SITE_OTHER): Payer: Medicare PPO

## 2020-09-03 ENCOUNTER — Other Ambulatory Visit: Payer: Self-pay

## 2020-09-04 ENCOUNTER — Encounter: Payer: Self-pay | Admitting: Family Medicine

## 2020-09-04 LAB — PTH, INTACT AND CALCIUM
Calcium: 10.6 mg/dL — ABNORMAL HIGH (ref 8.6–10.4)
PTH: 28 pg/mL (ref 16–77)

## 2020-09-05 ENCOUNTER — Encounter: Payer: Self-pay | Admitting: Family Medicine

## 2020-09-30 ENCOUNTER — Encounter: Payer: Self-pay | Admitting: Family Medicine

## 2020-09-30 DIAGNOSIS — I10 Essential (primary) hypertension: Secondary | ICD-10-CM

## 2020-09-30 MED ORDER — LOSARTAN POTASSIUM-HCTZ 50-12.5 MG PO TABS: 1.0000 | ORAL_TABLET | Freq: Every day | ORAL | 3 refills | Status: DC

## 2020-12-08 NOTE — Progress Notes (Deleted)
[facility] at [facility] [address], [place], [place] [address] [phone]  Date:  12/10/2020   Name:  Claudia Gregory   DOB:  1961/02/11   MRN:  [id number]  PCP:  Darreld Mclean, MD    Chief Complaint: No chief complaint on file.   History of Present Illness:  Claudia Gregory is a 60 y.o. very pleasant female patient who presents with the following:  Patient seen today for periodic follow-up visit.  Most recently seen by myself in April for physical That time she was having some increase of her anxiety, perhaps related to their adult son moving back in.  We increased her sertraline to 150 mg  History of deafness, hypertension, GERD, hyperlipidemia, obesity, peripheral neuropathy, diabetes, fatty liver with elevation of LFT Married to [place] who is also here today  Also seen with a ASL interpreter  At last visit I have been a bit concerned about her calcium level.  However hematology noted it was okay when corrected for albumin, nothing further needed at that time  BP Readings from Last 3 Encounters:  07/22/20 126/80  01/13/20 137/80  01/06/20 132/80   Shingles vaccine-patient has Medicare COVID booster fourth dose Can Give flu shot Eye exam is due  Lab Results  Component Value Date   HGBA1C 7.8 (H) 07/22/2020      Patient Active Problem List   Diagnosis Date Noted   Controlled type 2 diabetes mellitus without complication, without long-term current use of insulin ([place]) 07/11/2019   Generalized anxiety disorder 12/28/2015   Elevated liver enzymes 12/28/2015   Plantar fasciitis of right foot 04/01/2015   Neuropathic pain of both feet 10/27/2014   Hx of adenomatous polyp of colon 08/08/2014   Deafness 08/04/2014   GERD (gastroesophageal reflux disease) 05/13/2014   Hyperlipidemia 02/05/2013   Hot flashes 11/24/2012   OBESITY 10/23/2008   Hypertension 10/23/2008   BIPOLAR DISORDER UNSPECIFIED 09/02/2008     Past Medical History:  Diagnosis Date   Anxiety    Bipolar 1 disorder ([place])    Carpal tunnel syndrome of left wrist    Chest pain, atypical    Deaf    Depression    Diabetes mellitus without complication ([place])    past hx- off meds    FATIGUE    Gallstones    GERD (gastroesophageal reflux disease)    Hx of adenomatous polyp of colon 08/08/2014   Hyperlipidemia    Hypertension    OBESITY    Pneumonia     Past Surgical History:  Procedure Laterality Date   CHOLECYSTECTOMY  2000   COLONOSCOPY  2016   5 mm adenoma   POLYPECTOMY     TUBAL LIGATION  1998   UPPER GASTROINTESTINAL ENDOSCOPY      Social History   Tobacco Use   Smoking status: Never   Smokeless tobacco: Never  Vaping Use   Vaping Use: Never used  Substance Use Topics   Alcohol use: Yes    Alcohol/week: 2.0 standard drinks    Types: 2 Glasses of wine per week    Comment: rare glass of wine    Drug use: No    Family History  Problem Relation Age of Onset   Brain cancer Mother    Hypertension Mother    Diabetes Mother    Diabetes Maternal Aunt    Heart attack Maternal Grandmother    Hypertension Maternal Grandmother    Colon cancer Neg Hx  Rectal cancer Neg Hx    Stomach cancer Neg Hx    Colon polyps Neg Hx    Esophageal cancer Neg Hx     No Known Allergies  Medication list has been reviewed and updated.  Current Outpatient Medications on File Prior to Visit  Medication Sig Dispense Refill   Black Cohosh 40 MG CAPS Take by mouth.     losartan-hydrochlorothiazide (HYZAAR) 50-12.5 MG tablet Take 1 tablet by mouth daily. 90 tablet 3   pravastatin (PRAVACHOL) 20 MG tablet TAKE 1 TABLET(20 MG) BY MOUTH DAILY 90 tablet 3   sertraline (ZOLOFT) 100 MG tablet Take 1.5 tablets (150 mg total) by mouth daily. 135 tablet 3   Zoster Vaccine Adjuvanted [facility]) injection Inject into the muscle. 0.5 mL 0   No current facility-administered medications on file prior to visit.    Review of  Systems:  As per HPI- otherwise negative.   Physical Examination: There were no vitals filed for this visit. There were no vitals filed for this visit. There is no height or weight on file to calculate BMI. Ideal Body Weight:    GEN: no acute distress. HEENT: Atraumatic, Normocephalic.  Ears and Nose: No external deformity. CV: RRR, No M/G/R. No JVD. No thrill. No extra heart sounds. PULM: CTA B, no wheezes, crackles, rhonchi. No retractions. No resp. distress. No accessory muscle use. ABD: S, NT, ND, +BS. No rebound. No HSM. EXTR: No c/c/e PSYCH: Normally interactive. Conversant.    Assessment and Plan: ***  This visit occurred during the SARS-CoV-2 public health emergency.  Safety protocols were in place, including screening questions prior to the visit, additional usage of staff PPE, and extensive cleaning of exam room while observing appropriate contact time as indicated for disinfecting solutions.   Signed Lamar Blinks, MD

## 2020-12-10 ENCOUNTER — Ambulatory Visit: Payer: Medicare PPO | Admitting: Family Medicine

## 2020-12-16 ENCOUNTER — Other Ambulatory Visit (HOSPITAL_BASED_OUTPATIENT_CLINIC_OR_DEPARTMENT_OTHER): Payer: Self-pay

## 2021-01-05 NOTE — Progress Notes (Addendum)
Claudia Gregory at [employer] Claudia Gregory, Claudia Gregory, Claudia Gregory [phone]  Date:  01/07/2021   Name:  Claudia Gregory   DOB:  [birth year]   MRN:  [id number]  PCP:  Claudia Mclean, MD    Chief Complaint: Blood Pressure Check (Concerns/ questions: more fatigue and weight- pt thinks this might be related to DM/Flu shot today: yes, inquiring about another booster/)   History of Present Illness:  Claudia Gregory is a 60 y.o. very pleasant female patient who presents with the following:  Pt seen today for follow-up Last seen by myself in April for CPE  History of deafness, hypertension, GERD, hyperlipidemia, obesity, peripheral neuropathy, diabetes, fatty liver with elevation of LFT Married to Claudia Gregory who is also here today  Also seen with a ASL interpreter At our last visit we increased sertraline to 150- she was feeling more anxious, adult son had moved back in with them.  At this time she notes her anxiety is improved, wishes to continue current sertraline dose  At last labs her calcium was a bit high- I touched base with hematology who did not feel any cause for alarm  Shingrix-patient reports this is complete Covid new booster- they plan to do prior to a plan cruise in December   Flu shot- give today  Eye exam- she is due for this, reminded Foot exam due- will do today   Lab Results  Component Value Date   HGBA1C 7.8 (H) 07/22/2020   Today she also notes a LEFT sided pain in her arm that tends to occur when she is more stressed She has noted this for a few months The pain seems to run from her left shoulder down her left arm, and also is in her left superior trapezius area Not bothering her right now; most recently occurred 3 or 4 days ago No CP or SOB It may also cause some soreness in her left upper back- they will feel a knot in her upper back that her husband will massage for her The pain may last for a long time unless  she uses heating pad or ice on the area It may occur a few times a week Symptoms are Non -exertional  She denies any other chest pain She did do a stress test about a decade ago   Patient Active Problem List   Diagnosis Date Noted   Controlled type 2 diabetes mellitus without complication, without long-term current use of insulin ([place]) 07/11/2019   Generalized anxiety disorder 12/28/2015   Elevated liver enzymes 12/28/2015   Plantar fasciitis of right foot 04/01/2015   Neuropathic pain of both feet 10/27/2014   Hx of adenomatous polyp of colon 08/08/2014   Deafness 08/04/2014   GERD (gastroesophageal reflux disease) 05/13/2014   Hyperlipidemia 02/05/2013   Hot flashes 11/24/2012   OBESITY 10/23/2008   Hypertension 10/23/2008   BIPOLAR DISORDER UNSPECIFIED 09/02/2008    Past Medical History:  Diagnosis Date   Anxiety    Bipolar 1 disorder ([place])    Carpal tunnel syndrome of left wrist    Chest pain, atypical    Deaf    Depression    Diabetes mellitus without complication ([place])    past hx- off meds    FATIGUE    Gallstones    GERD (gastroesophageal reflux disease)    Hx of adenomatous polyp of colon 08/08/2014   Hyperlipidemia    Hypertension    OBESITY  Pneumonia     Past Surgical History:  Procedure Laterality Date   CHOLECYSTECTOMY  2000   COLONOSCOPY  2016   5 mm adenoma   POLYPECTOMY     TUBAL LIGATION  1998   UPPER GASTROINTESTINAL ENDOSCOPY      Social History   Tobacco Use   Smoking status: Never   Smokeless tobacco: Never  Vaping Use   Vaping Use: Never used  Substance Use Topics   Alcohol use: Yes    Alcohol/week: 2.0 standard drinks    Types: 2 Glasses of wine per week    Comment: rare glass of wine    Drug use: No    Family History  Problem Relation Age of Onset   Brain cancer Mother    Hypertension Mother    Diabetes Mother    Diabetes Maternal Aunt    Heart attack Maternal Grandmother    Hypertension Maternal Grandmother    Colon  cancer Neg Hx    Rectal cancer Neg Hx    Stomach cancer Neg Hx    Colon polyps Neg Hx    Esophageal cancer Neg Hx     No Known Allergies  Medication list has been reviewed and updated.  Current Outpatient Medications on File Prior to Visit  Medication Sig Dispense Refill   Black Cohosh 40 MG CAPS Take by mouth.     sertraline (ZOLOFT) 100 MG tablet Take 1.5 tablets (150 mg total) by mouth daily. 135 tablet 3   No current facility-administered medications on file prior to visit.    Review of Systems:  As per HPI- otherwise negative.   Physical Examination: Vitals:   01/07/21 1402  BP: 120/82  Pulse: 65  Resp: 18  Temp: 97.7 F (36.5 C)  SpO2: 96%   Vitals:   01/07/21 1402  Weight: 189 lb (85.7 kg)  Height: 5\' 6"  (1.676 m)   Body mass index is 30.51 kg/m. Ideal Body Weight: Weight in (lb) to have BMI = 25: 154.6  GEN: no acute distress.  Obese, looks well.  Patient has deafness, cochlear implant HEENT: Atraumatic, Normocephalic.  Ears and Nose: No external deformity. CV: RRR, No M/G/R. No JVD. No thrill. No extra heart sounds. PULM: CTA B, no wheezes, crackles, rhonchi. No retractions. No resp. distress. No accessory muscle use. ABD: S, NT, ND, +BS. No rebound. No HSM. EXTR: No c/c/e PSYCH: Normally interactive. Conversant.  Foot exam completed today, normal I am somewhat able to reproduce the pain she has noted by pressing in the left trapezius area Left shoulder range of motion is normal, pain-free EKG: SR with new downgoing T wave in V2 -this is different from prior tracing 10/21 Assessment and Plan: Essential hypertension - Plan: Comprehensive metabolic panel, CBC  Hypercalcemia - Plan: VITAMIN D 25 Hydroxy (Vit-D Deficiency, Fractures), Comprehensive metabolic panel  Primary hypertension - Plan: TSH, Comprehensive metabolic panel  Controlled type 2 diabetes mellitus without complication, without long-term current use of insulin ([place]) - Plan: Hemoglobin  A1c, Comprehensive metabolic panel  Elevated liver enzymes - Plan: Comprehensive metabolic panel  Hyperlipidemia, unspecified hyperlipidemia type - Plan: Lipid panel  Need for influenza vaccination - Plan: Flu Vaccine QUAD 6+ mos PF IM (Fluarix Quad PF)  Chronic left shoulder pain - Plan: EKG 12-Lead, methocarbamol (ROBAXIN) 500 MG tablet, Exercise Tolerance Test, Cardiac Stress Test: Informed Consent Details: Physician/Practitioner Attestation; Transcribe to consent form and obtain patient signature  Patient seen today for a follow-up visit.  Blood pressure under good control, other  labs are pending as above Flu shot given, she plans to get COVID booster next month Claudia Gregory notes a pain in her left shoulder and left arm which tends to occur when she is feeling very stressed.  She notes that massage or using heat will cause it to go away.  The pain is nonexertional.  However, offered further restratification with a stress test and she would like to set this up.  She is able to walk on a treadmill, ordered exercise treadmill test.  Prescribed Robaxin to use as needed in the meantime.  She is asked to seek immediate care if her symptoms change or worsen     Signed Lamar Blinks, MD  Addendum 10/7, received labs as below.  Message to patient Cholesterol is above goal A1c has gone up Results for orders placed or performed in visit on 01/07/21  Hemoglobin A1c  Result Value Ref Range   Hgb A1c MFr Bld 8.1 (H) 4.6 - 6.5 %  Lipid panel  Result Value Ref Range   Cholesterol 276 (H) 0 - 200 mg/dL   Triglycerides 196.0 (H) 0.0 - 149.0 mg/dL   HDL 66.20 >39.00 mg/dL   VLDL 39.2 0.0 - 40.0 mg/dL   LDL Cholesterol 170 (H) 0 - 99 mg/dL   Total CHOL/HDL Ratio 4    NonHDL 209.48   VITAMIN D 25 Hydroxy (Vit-D Deficiency, Fractures)  Result Value Ref Range   VITD 22.71 (L) 30.00 - 100.00 ng/mL  TSH  Result Value Ref Range   TSH 4.47 0.35 - 5.50 uIU/mL  Comprehensive metabolic panel  Result  Value Ref Range   Sodium 136 135 - 145 mEq/L   Potassium 3.9 3.5 - 5.1 mEq/L   Chloride 96 96 - 112 mEq/L   CO2 30 19 - 32 mEq/L   Glucose, Bld 116 (H) 70 - 99 mg/dL   BUN 11 6 - 23 mg/dL   Creatinine, Ser 0.54 0.40 - 1.20 mg/dL   Total Bilirubin 0.5 0.2 - 1.2 mg/dL   Alkaline Phosphatase 78 39 - 117 U/L   AST 35 0 - 37 U/L   ALT 58 (H) 0 - 35 U/L   Total Protein 7.7 6.0 - 8.3 g/dL   Albumin 4.8 3.5 - 5.2 g/dL   GFR 101.88 >60.00 mL/min   Calcium 10.8 (H) 8.4 - 10.5 mg/dL  CBC  Result Value Ref Range   WBC 7.7 4.0 - 10.5 K/uL   RBC 4.85 3.87 - 5.11 Mil/uL   Platelets 227.0 150.0 - 400.0 K/uL   Hemoglobin 14.3 12.0 - 15.0 g/dL   HCT 41.8 36.0 - 46.0 %   MCV 86.3 78.0 - 100.0 fl   MCHC 34.2 30.0 - 36.0 g/dL   RDW 12.8 11.5 - 15.5 %

## 2021-01-05 NOTE — Patient Instructions (Addendum)
Good to see you again today, I will be in touch with your labs asap  Please get your covid vaccine this fall prior to your cruise Flu shot given today We will set you up for a stress test of your heart asap- if anything is changing or getting worse in the meantime please alert me Use the robaxin as needed for muscle pain in your shoulder- however watch for feeling sleepy!

## 2021-01-07 ENCOUNTER — Other Ambulatory Visit: Payer: Self-pay

## 2021-01-07 ENCOUNTER — Ambulatory Visit: Payer: Medicare PPO | Admitting: Family Medicine

## 2021-01-07 VITALS — BP 120/82 | HR 65 | Temp 97.7°F | Resp 18 | Ht 66.0 in | Wt 189.0 lb

## 2021-01-07 DIAGNOSIS — R748 Abnormal levels of other serum enzymes: Secondary | ICD-10-CM | POA: Diagnosis not present

## 2021-01-07 DIAGNOSIS — E785 Hyperlipidemia, unspecified: Secondary | ICD-10-CM | POA: Diagnosis not present

## 2021-01-07 DIAGNOSIS — M25512 Pain in left shoulder: Secondary | ICD-10-CM

## 2021-01-07 DIAGNOSIS — E119 Type 2 diabetes mellitus without complications: Secondary | ICD-10-CM

## 2021-01-07 DIAGNOSIS — I10 Essential (primary) hypertension: Secondary | ICD-10-CM | POA: Diagnosis not present

## 2021-01-07 DIAGNOSIS — Z23 Encounter for immunization: Secondary | ICD-10-CM | POA: Diagnosis not present

## 2021-01-07 DIAGNOSIS — G8929 Other chronic pain: Secondary | ICD-10-CM

## 2021-01-07 MED ORDER — METHOCARBAMOL 500 MG PO TABS: 500.0000 mg | ORAL_TABLET | Freq: Three times a day (TID) | ORAL | 1 refills | Status: DC | PRN

## 2021-01-08 ENCOUNTER — Encounter: Payer: Self-pay | Admitting: Family Medicine

## 2021-01-08 DIAGNOSIS — E785 Hyperlipidemia, unspecified: Secondary | ICD-10-CM

## 2021-01-08 DIAGNOSIS — E119 Type 2 diabetes mellitus without complications: Secondary | ICD-10-CM

## 2021-01-08 LAB — COMPREHENSIVE METABOLIC PANEL
ALT: 58 U/L — ABNORMAL HIGH (ref 0–35)
AST: 35 U/L (ref 0–37)
Albumin: 4.8 g/dL (ref 3.5–5.2)
Alkaline Phosphatase: 78 U/L (ref 39–117)
BUN: 11 mg/dL (ref 6–23)
CO2: 30 mEq/L (ref 19–32)
Calcium: 10.8 mg/dL — ABNORMAL HIGH (ref 8.4–10.5)
Chloride: 96 mEq/L (ref 96–112)
Creatinine, Ser: 0.54 mg/dL (ref 0.40–1.20)
GFR: 101.88 mL/min (ref >60.00)
Glucose, Bld: 116 mg/dL — ABNORMAL HIGH (ref 70–99)
Potassium: 3.9 mEq/L (ref 3.5–5.1)
Sodium: 136 mEq/L (ref 135–145)
Total Bilirubin: 0.5 mg/dL (ref 0.2–1.2)
Total Protein: 7.7 g/dL (ref 6.0–8.3)

## 2021-01-08 LAB — CBC
HCT: 41.8 % (ref 36.0–46.0)
Hemoglobin: 14.3 g/dL (ref 12.0–15.0)
MCHC: 34.2 g/dL (ref 30.0–36.0)
MCV: 86.3 fl (ref 78.0–100.0)
Platelets: 227 10*3/uL (ref 150.0–400.0)
RBC: 4.85 Mil/uL (ref 3.87–5.11)
RDW: 12.8 % (ref 11.5–15.5)
WBC: 7.7 10*3/uL (ref 4.0–10.5)

## 2021-01-08 LAB — LIPID PANEL
Cholesterol: 276 mg/dL — ABNORMAL HIGH (ref 0–200)
HDL: 66.2 mg/dL (ref >39.00)
LDL Cholesterol: 170 mg/dL — ABNORMAL HIGH (ref 0–99)
NonHDL: 209.48
Total CHOL/HDL Ratio: 4
Triglycerides: 196 mg/dL — ABNORMAL HIGH (ref 0.0–149.0)
VLDL: 39.2 mg/dL (ref 0.0–40.0)

## 2021-01-08 LAB — VITAMIN D 25 HYDROXY (VIT D DEFICIENCY, FRACTURES): VITD: 22.71 ng/mL — ABNORMAL LOW (ref 30.00–100.00)

## 2021-01-08 LAB — TSH: TSH: 4.47 u[IU]/mL (ref 0.35–5.50)

## 2021-01-08 LAB — HEMOGLOBIN A1C: Hgb A1c MFr Bld: 8.1 % — ABNORMAL HIGH (ref 4.6–6.5)

## 2021-01-08 MED ORDER — METFORMIN HCL 500 MG PO TABS: 500.0000 mg | ORAL_TABLET | Freq: Two times a day (BID) | ORAL | 3 refills | Status: DC

## 2021-01-08 MED ORDER — ROSUVASTATIN CALCIUM 20 MG PO TABS: 20.0000 mg | ORAL_TABLET | Freq: Every day | ORAL | 3 refills | Status: DC

## 2021-01-13 ENCOUNTER — Encounter: Payer: Self-pay | Admitting: Cardiovascular Disease

## 2021-01-13 ENCOUNTER — Ambulatory Visit: Payer: Medicare PPO | Admitting: Cardiovascular Disease

## 2021-01-13 ENCOUNTER — Other Ambulatory Visit: Payer: Self-pay

## 2021-01-13 DIAGNOSIS — M79602 Pain in left arm: Secondary | ICD-10-CM | POA: Diagnosis not present

## 2021-01-13 DIAGNOSIS — E119 Type 2 diabetes mellitus without complications: Secondary | ICD-10-CM | POA: Diagnosis not present

## 2021-01-13 DIAGNOSIS — E782 Mixed hyperlipidemia: Secondary | ICD-10-CM | POA: Diagnosis not present

## 2021-01-13 DIAGNOSIS — R0602 Shortness of breath: Secondary | ICD-10-CM

## 2021-01-13 NOTE — Patient Instructions (Addendum)
Medication Instructions:  No Changes In Medications at this time.  *If you need a refill on your cardiac medications before your next appointment, please call your pharmacy*  Testing/Procedures: Your physician has requested that you have an echocardiogram. Echocardiography is a painless test that uses sound waves to create images of your heart. It provides your doctor with information about the size and shape of your heart and how well your heart's chambers and valves are working. You may receive an ultrasound enhancing agent through an IV if needed to better visualize your heart during the echo.This procedure takes approximately one hour. There are no restrictions for this procedure. This will take place at the [address], [address].    Your physician has requested that you have an exercise tolerance test. For further information please visit [email or web address]. Please also follow instruction sheet, as given.  Follow-Up: At [facility], you and your health needs are our priority.  As part of our continuing mission to provide you with exceptional heart care, we have created designated Provider Care Teams.  These Care Teams include your primary Cardiologist (physician) and Advanced Practice Providers (APPs -  Physician Assistants and Nurse Practitioners) who all work together to provide you with the care you need, when you need it.  Your next appointment:   AS NEEDED   The format for your next appointment:   In Person  Provider:   Shelva Majestic, MD

## 2021-01-13 NOTE — Progress Notes (Signed)
Cardiology Office Note    Date:  01/15/2021   ID:  Claudia Gregory, DOB Jun 05, 1960, MRN [id number]  PCP:  Darreld Mclean, MD  Cardiologist:  Claudia Majestic, MD   New cardiology evaluation who was referred by Dr. Lamar Gregory for evaluation of stress mediated pain in her left shoulder and left arm.   History of Present Illness:  Claudia Gregory is a 60 y.o. female who has a history of congenital deafness.  She is here in the office today with a guest with sign language.  Claudia Gregory has a history of type 2 diabetes mellitus on metformin 500 mg twice a day in addition to hyperlipidemia for which she is on rosuvastatin 20 mg.  She has also been on Zoloft 150 mg daily. She was evaluated by Dr. Johnsie Gregory with atypical chest pain.  At that time she was taking hydrochlorothiazide for blood pressure.  Her chest pain was not felt to be ischemic mediated.  Recently, she has had issues with increasing stress.  She admits that with stress she has noticed left shoulder and arm discomfort.  She denies any precipitation of this discomfort during periods of walking.  She states the discomfort is only in the left arm.  At times she does also have increased tension in the left trapezius region.  She denies any palpitations.  Her discomfort is improved with ice packs as well as Advil.  She was recently seen by Dr. Margaretmary Gregory.  She had given an order for the patient to undergo a routine treadmill test for further evaluation but this has not yet been scheduled.  She presents for evaluation.  She had undergone recent laboratory on January 07, 2021 which showed a normal CBC.  Lipid studies were significantly increased with total cholesterol 276, triglycerides 196, and LDL cholesterol 1 7.  Of note, in October 2021, total cholesterol was 275 and triglycerides 285.  TSH was normal at 4.47.  Chemistry revealed an elevated calcium at 10.8, glucose was 116, AST was normal at 35 but ALT was mildly increased at 58.  She presents  for evaluation.  Past Medical History:  Diagnosis Date   Anxiety    Bipolar 1 disorder ([place])    Carpal tunnel syndrome of left wrist    Chest pain, atypical    Deaf    Depression    Diabetes mellitus without complication ([place])    past hx- off meds    FATIGUE    Gallstones    GERD (gastroesophageal reflux disease)    Hx of adenomatous polyp of colon 08/08/2014   Hyperlipidemia    Hypertension    OBESITY    Pneumonia     Past Surgical History:  Procedure Laterality Date   CHOLECYSTECTOMY  2000   COLONOSCOPY  2016   5 mm adenoma   POLYPECTOMY     TUBAL LIGATION  1998   UPPER GASTROINTESTINAL ENDOSCOPY      Current Medications: Outpatient Medications Prior to Visit  Medication Sig Dispense Refill   Black Cohosh 40 MG CAPS Take by mouth.     metFORMIN (GLUCOPHAGE) 500 MG tablet Take 1 tablet (500 mg total) by mouth 2 (two) times daily with a meal. 180 tablet 3   methocarbamol (ROBAXIN) 500 MG tablet Take 1 tablet (500 mg total) by mouth every 8 (eight) hours as needed for muscle spasms. 30 tablet 1   rosuvastatin (CRESTOR) 20 MG tablet Take 1 tablet (20 mg total) by mouth daily. 90 tablet 3   sertraline (  ZOLOFT) 100 MG tablet Take 1.5 tablets (150 mg total) by mouth daily. 135 tablet 3   No facility-administered medications prior to visit.     Allergies:   Patient has no known allergies.   Social History   Socioeconomic History   Marital status: Married    Spouse name: Not on file   Number of children: 2   Years of education: Not on file   Highest education level: Not on file  Occupational History   Occupation: laundry prep  Tobacco Use   Smoking status: Never   Smokeless tobacco: Never  Vaping Use   Vaping Use: Never used  Substance and Sexual Activity   Alcohol use: Yes    Alcohol/week: 2.0 standard drinks    Types: 2 Glasses of wine per week    Comment: rare glass of wine    Drug use: No   Sexual activity: Not on file  Other Topics Concern   Not on  file  Social History Narrative   Married 2 sons she works Oceanographer at [employer] eats 2 caffeinated beverages daily  2 alcoholic drinks daily   3/84/6659          Social Determinants of Health   Financial Resource Strain: Not on file  Food Insecurity: Not on file  Transportation Needs: Not on file  Physical Activity: Not on file  Stress: Not on file  Social Connections: Not on file    Socially she is married.  She has 2 children ages 34 and 50.  Family History:  The patient's family history includes Brain cancer in her mother; Diabetes in her maternal aunt and mother; Heart attack in her maternal grandmother; Hypertension in her maternal grandmother and mother.   Family history is notable that a grandmother and her father had suffered myocardial infarctions at age 66.  ROS General: Negative; No fevers, chills, or night sweats;  HEENT: Negative; No changes in vision or hearing, sinus congestion, difficulty swallowing Pulmonary: Negative; No cough, wheezing, shortness of breath, hemoptysis Cardiovascular: Negative; No chest pain, presyncope, syncope, palpitations GI: Negative; No nausea, vomiting, diarrhea, or abdominal pain GU: Negative; No dysuria, hematuria, or difficulty voiding Musculoskeletal: Negative; no myalgias, joint pain, or weakness Hematologic/Oncology: Negative; no easy bruising, bleeding Endocrine: Negative; no heat/cold intolerance; no diabetes Neuro: Negative; no changes in balance, headaches Skin: Negative; No rashes or skin lesions Psychiatric: Negative; No behavioral problems, depression Sleep: Negative; No snoring, daytime sleepiness, hypersomnolence, bruxism, restless legs, hypnogognic hallucinations, no cataplexy Other comprehensive 14 point system review is negative.   PHYSICAL EXAM:   VS:  BP 130/70 (BP Location: Left Arm, Patient Position: Sitting, Cuff Size: Normal)   Pulse 64   Ht _0  (1.676 m)   Wt 189 lb 9.6 oz (86 kg)   LMP 10/06/2015  (Approximate)   SpO2 98%   BMI 30.60 kg/m     Repeat blood pressure by me was 128/74  Wt Readings from Last 3 Encounters:  01/13/21 189 lb 9.6 oz (86 kg)  01/07/21 189 lb (85.7 kg)  07/22/20 190 lb (86.2 kg)    General: Alert, oriented, no distress.  Skin: normal turgor, no rashes, warm and dry HEENT: Normocephalic, atraumatic. Pupils equal round and reactive to light; sclera anicteric; extraocular muscles intact;  Nose without nasal septal hypertrophy Mouth/Parynx benign; Mallinpatti scale 3 Neck: No JVD, no carotid bruits; normal carotid upstroke Lungs: clear to ausculatation and percussion; no wheezing or rales Chest wall: No chest wall tenderness over the costochondral region  or over the bicipital tendon region  Heart: PMI not displaced, RRR, s1 s2 normal, 1/6 systolic murmur, no diastolic murmur, no rubs, gallops, thrills, or heaves Abdomen: soft, nontender; no hepatosplenomehaly, BS+; abdominal aorta nontender and not dilated by palpation. Back: no CVA tenderness Pulses 2+ Musculoskeletal: There was mild increased muscle tension in the left trapezius muscle and she also had some vague palpable left arm sensation. Extremities: no clubbing cyanosis or edema, Homan's sign negative  Neurologic: grossly nonfocal; Cranial nerves grossly wnl Psychologic: Normal mood and affect   Studies/Labs Reviewed:   EKG:  EKG is ordered today.  ECG (independently read by me): NSR at 64, no ST changes, no ectopy  Recent Labs: BMP Latest Ref Rng & Units 01/07/2021 09/03/2020 07/22/2020  Glucose 70 - 99 mg/dL 116(H) - 136(H)  BUN 6 - 23 mg/dL 11 - 13  Creatinine 0.40 - 1.20 mg/dL 0.54 - 0.60  BUN/Creat Ratio 6 - 22 (calc) - - -  Sodium 135 - 145 mEq/L 136 - 135  Potassium 3.5 - 5.1 mEq/L 3.9 - 4.2  Chloride 96 - 112 mEq/L 96 - 97  CO2 19 - 32 mEq/L 30 - 30  Calcium 8.4 - 10.5 mg/dL 10.8(H) 10.6(H) 10.7(H)     Hepatic Function Latest Ref Rng & Units 01/07/2021 07/22/2020 01/13/2020  Total  Protein 6.0 - 8.3 g/dL 7.7 7.8 8.1  Albumin 3.5 - 5.2 g/dL 4.8 4.6 4.7  AST 0 - 37 U/L 35 33 35  ALT 0 - 35 U/L 58(H) 60(H) 52(H)  Alk Phosphatase 39 - 117 U/L 78 74 69  Total Bilirubin 0.2 - 1.2 mg/dL 0.5 0.4 0.5    CBC Latest Ref Rng & Units 01/07/2021 01/13/2020 01/06/2020  WBC 4.0 - 10.5 K/uL 7.7 7.1 7.4  Hemoglobin 12.0 - 15.0 g/dL 14.3 14.1 14.2  Hematocrit 36.0 - 46.0 % 41.8 41.1 40.6  Platelets 150.0 - 400.0 K/uL 227.0 222 238   Lab Results  Component Value Date   MCV 86.3 01/07/2021   MCV 85.8 01/13/2020   MCV 87.3 01/06/2020   Lab Results  Component Value Date   TSH 4.47 01/07/2021   Lab Results  Component Value Date   HGBA1C 8.1 (H) 01/07/2021     BNP No results found for: BNP  ProBNP No results found for: PROBNP   Lipid Panel     Component Value Date/Time   CHOL 276 (H) 01/07/2021 1521   TRIG 196.0 (H) 01/07/2021 1521   HDL 66.20 01/07/2021 1521   CHOLHDL 4 01/07/2021 1521   VLDL 39.2 01/07/2021 1521   LDLCALC 170 (H) 01/07/2021 1521   LDLCALC 165 (H) 01/06/2020 1429   LDLDIRECT 188.0 07/11/2019 1455     RADIOLOGY: No results found.   Additional studies/ records that were reviewed today include:  I reviewed the September 06, 2011 evaluation of Dr. Jenkins Rouge.  Records of Dr.Copland were reviewed.   ASSESSMENT:    1. Left arm pain   2. Shortness of breath   3. Mixed hyperlipidemia   4. Serum calcium elevated   5. Controlled type 2 diabetes mellitus without complication, without long-term current use of insulin [facility])     PLAN:  Claudia Gregory is a very pleasant 60 year old female who has a history of congenital deafness as well as a reported prior history of hypertension, hyperlipidemia, diabetes mellitus, and fatty liver.  She was evaluated for atypical chest pain in 2013 which was felt to be nonischemic.  Her present discomfort also is  atypical for ischemic heart disease.  Specifically, she denies any exertional component.  She denies any  chest discomfort.  Her pain seems to be precipitated by significant increase stress where she may tighten up her muscles, there is some tenderness and fullness over the left trapezius muscle.  Ultimately her arm discomfort improves with ice packs and Advil.  She specifically denies any difficulty with walking.  At times there is some mild shortness of breath with increased activity.  I suspect her arm discomfort is musculoskeletal.  She is to have a routine graded exercise treadmill ordered by Dr. Lorelei Pont and I will schedule her to undergo this evaluation.  In addition I will also schedule her to undergo a 2D echo Doppler assessment.  She may ultimately benefit from x-rays of her cervical spine but I will defer this to her primary physician.  In addition, laboratory has shown significant increased lipids.  She is now on rosuvastatin.  Her lipid studies are suggestive of possible familial hyperlipidemia with her significant elevation of LDL cholesterol particularly of this is still elevated will rosuvastatin therapy.  She has been found to have mild LFT elevation which is felt to be due to fatty liver but can also be contributed by statins.  At present I would not initiate new therapy but improve diet is essential.  She may benefit from the addition of  zetia 10 mg.  In addition, it may be worthwhile in the future consider coronary calcium evaluation and potential candidacy for PCSK9 inhibition.  Her most recent laboratory also demonstrated mildly increased calcium which is being followed by Dr. Lorelei Pont.  I will see her in follow-up of the above studies as needed.  Medication Adjustments/Labs and Tests Ordered: Current medicines are reviewed at length with the patient today.  Concerns regarding medicines are outlined above.  Medication changes, Labs and Tests ordered today are listed in the Patient Instructions below. Patient Instructions  Medication Instructions:  No Changes In Medications at this time.  *If  you need a refill on your cardiac medications before your next appointment, please call your pharmacy*  Testing/Procedures: Your physician has requested that you have an echocardiogram. Echocardiography is a painless test that uses sound waves to create images of your heart. It provides your doctor with information about the size and shape of your heart and how well your heart's chambers and valves are working. You may receive an ultrasound enhancing agent through an IV if needed to better visualize your heart during the echo.This procedure takes approximately one hour. There are no restrictions for this procedure. This will take place at the [address].    Your physician has requested that you have an exercise tolerance test. For further information please visit [email or web address]. Please also follow instruction sheet, as given.  Follow-Up: At [facility], you and your health needs are our priority.  As part of our continuing mission to provide you with exceptional heart care, we have created designated Provider Care Teams.  These Care Teams include your primary Cardiologist (physician) and Advanced Practice Providers (APPs -  Physician Assistants and Nurse Practitioners) who all work together to provide you with the care you need, when you need it.  Your next appointment:   AS NEEDED   The format for your next appointment:   In Person  Provider:   Shelva Majestic, MD     Signed, Claudia Majestic, MD  01/15/2021 10:43 AM    [facility] [address]  [address], [place], [place],   [address] Phone: [phone]

## 2021-01-15 ENCOUNTER — Encounter: Payer: Self-pay | Admitting: Cardiovascular Disease

## 2021-01-22 ENCOUNTER — Telehealth (HOSPITAL_COMMUNITY): Payer: Self-pay | Admitting: *Deleted

## 2021-01-22 NOTE — Telephone Encounter (Signed)
Close encounter 

## 2021-01-26 ENCOUNTER — Ambulatory Visit (HOSPITAL_COMMUNITY)
Admission: RE | Admit: 2021-01-26 | Payer: Medicare PPO | Source: Ambulatory Visit | Attending: Family Medicine | Admitting: Family Medicine

## 2021-01-26 ENCOUNTER — Telehealth (HOSPITAL_COMMUNITY): Payer: Self-pay | Admitting: Cardiovascular Disease

## 2021-01-26 NOTE — Telephone Encounter (Signed)
Sent to primary nurse as FYI 

## 2021-01-26 NOTE — Telephone Encounter (Signed)
Patient cancelled echocardiogram for reason below:  01/26/2021 8:31 AM GG:PCWT, [name]  Cancel Rsn: Patient (canceling with sign langauge interpreter. pt states she will callback to reschedule  Order will be removed from the active ECHO WQ and when patient calls back to reschedule we will reinstate the order.

## 2021-01-26 NOTE — Telephone Encounter (Signed)
Noted. Thanks.

## 2021-01-27 ENCOUNTER — Ambulatory Visit (HOSPITAL_COMMUNITY): Payer: Medicare PPO

## 2021-02-04 ENCOUNTER — Ambulatory Visit (INDEPENDENT_AMBULATORY_CARE_PROVIDER_SITE_OTHER): Payer: Medicare PPO

## 2021-02-04 ENCOUNTER — Encounter: Payer: Self-pay | Admitting: Family Medicine

## 2021-02-04 ENCOUNTER — Other Ambulatory Visit: Payer: Self-pay

## 2021-02-04 DIAGNOSIS — G8929 Other chronic pain: Secondary | ICD-10-CM

## 2021-02-04 DIAGNOSIS — M25512 Pain in left shoulder: Secondary | ICD-10-CM | POA: Diagnosis not present

## 2021-02-04 LAB — EXERCISE TOLERANCE TEST
Angina Index: 0
Duke Treadmill Score: 8
Estimated workload: 8.5
Exercise duration (min): 8 min
Exercise duration (sec): 0 s
MPHR: 163 {beats}/min
Peak HR: 131 {beats}/min
Percent HR: 80 %
RPE: 17
Rest HR: 67 {beats}/min
ST Depression (mm): 0 mm

## 2021-02-17 ENCOUNTER — Telehealth (HOSPITAL_COMMUNITY): Payer: Self-pay | Admitting: Cardiovascular Disease

## 2021-02-17 ENCOUNTER — Ambulatory Visit (HOSPITAL_COMMUNITY): Payer: Medicare PPO

## 2021-02-17 ENCOUNTER — Encounter (HOSPITAL_COMMUNITY): Payer: Self-pay

## 2021-02-17 NOTE — Telephone Encounter (Signed)
Routed to primary nurse as Juluis Rainier

## 2021-02-17 NOTE — Telephone Encounter (Signed)
Patient called and cancelled echocardiogram for reason below:  02/17/2021 8:41 AM MB:PJPETK, [name]  Cancel Rsn: Patient (pt woke up feeling sick, will cb to r/s)  01/26/2021 8:31 AM KO:ECXF, [name]  Cancel Rsn: Patient (canceling with sign langauge interpreter. pt states she will callback to reschedule  Order will be removed from the [facility] and when patient calls back to reschedule we will reinstate the order.

## 2021-04-07 ENCOUNTER — Telehealth: Payer: Self-pay | Admitting: Family Medicine

## 2021-04-07 ENCOUNTER — Encounter: Payer: Self-pay | Admitting: Family Medicine

## 2021-04-07 ENCOUNTER — Telehealth (INDEPENDENT_AMBULATORY_CARE_PROVIDER_SITE_OTHER): Payer: Medicare PPO | Admitting: Family Medicine

## 2021-04-07 VITALS — Ht 66.0 in | Wt 187.0 lb

## 2021-04-07 DIAGNOSIS — U071 COVID-19: Secondary | ICD-10-CM

## 2021-04-07 MED ORDER — MOLNUPIRAVIR EUA 200MG CAPSULE: 4.0000 | ORAL_CAPSULE | Freq: Two times a day (BID) | ORAL | 0 refills | Status: AC

## 2021-04-07 NOTE — Progress Notes (Signed)
[place] [address], [place], [place] [address] [phone]  Date:  04/07/2021   Name:  Claudia Gregory   DOB:  1961-02-25   MRN:  [id number]  PCP:  Darreld Mclean, MD    Chief Complaint: Medical Management of Chronic Issues   History of Present Illness:  Claudia Gregory is a 61 y.o. very pleasant female patient who presents with the following:  Reason for visit today- positive for  COVID-19 Patient with history of diabetes, hyperlipidemia, hypertension, deafness She follows up with me regularly for health maintenance  She has been vaccinated against COVID-19  Patient location is home, my location is office.  Patient identity confirmed with 2 factors, she gives consent for virtual visit today.  The patient, something which interpreter and myself are present on the call today  Pt notes she tested + for covid yesterday and again this am at home Her sx began last week; she thought she maybe had the flu She estimates she has had sx for about 5 days-she cannot remember the exact date, but notes that they skipped NYE festivities due to her sx  No fever noted She does have a cough as of the last 2 days Taste and smell ok She feels tired out, appetite is decreased  No diarrhea or vomiting- she did have some loose stools but nothing major  She has noted a ST No SOB noted   Most recent covid booster about 14 months ago She would like to try antivirals -she understands she is toward the outer end of treatment window    Patient Active Problem List   Diagnosis Date Noted   Controlled type 2 diabetes mellitus without complication, without long-term current use of insulin ([place]) 07/11/2019   Generalized anxiety disorder 12/28/2015   Elevated liver enzymes 12/28/2015   Plantar fasciitis of right foot 04/01/2015   Neuropathic pain of both feet 10/27/2014   Hx of adenomatous polyp of colon 08/08/2014   Deafness  08/04/2014   GERD (gastroesophageal reflux disease) 05/13/2014   Hyperlipidemia 02/05/2013   Hot flashes 11/24/2012   OBESITY 10/23/2008   Hypertension 10/23/2008   BIPOLAR DISORDER UNSPECIFIED 09/02/2008    Past Medical History:  Diagnosis Date   Anxiety    Bipolar 1 disorder ([place])    Carpal tunnel syndrome of left wrist    Chest pain, atypical    Deaf    Depression    Diabetes mellitus without complication ([place])    past hx- off meds    FATIGUE    Gallstones    GERD (gastroesophageal reflux disease)    Hx of adenomatous polyp of colon 08/08/2014   Hyperlipidemia    Hypertension    OBESITY    Pneumonia     Past Surgical History:  Procedure Laterality Date   CHOLECYSTECTOMY  2000   COLONOSCOPY  2016   5 mm adenoma   POLYPECTOMY     TUBAL LIGATION  1998   UPPER GASTROINTESTINAL ENDOSCOPY      Social History   Tobacco Use   Smoking status: Never   Smokeless tobacco: Never  Vaping Use   Vaping Use: Never used  Substance Use Topics   Alcohol use: Yes    Alcohol/week: 2.0 standard drinks    Types: 2 Glasses of wine per week    Comment: rare glass of wine    Drug use: No    Family History  Problem Relation Age of  Onset   Brain cancer Mother    Hypertension Mother    Diabetes Mother    Diabetes Maternal Aunt    Heart attack Maternal Grandmother    Hypertension Maternal Grandmother    Colon cancer Neg Hx    Rectal cancer Neg Hx    Stomach cancer Neg Hx    Colon polyps Neg Hx    Esophageal cancer Neg Hx     No Known Allergies  Medication list has been reviewed and updated.  Current Outpatient Medications on File Prior to Visit  Medication Sig Dispense Refill   Black Cohosh 40 MG CAPS Take by mouth.     metFORMIN (GLUCOPHAGE) 500 MG tablet Take 1 tablet (500 mg total) by mouth 2 (two) times daily with a meal. 180 tablet 3   methocarbamol (ROBAXIN) 500 MG tablet Take 1 tablet (500 mg total) by mouth every 8 (eight) hours as needed for muscle spasms. 30  tablet 1   rosuvastatin (CRESTOR) 20 MG tablet Take 1 tablet (20 mg total) by mouth daily. 90 tablet 3   sertraline (ZOLOFT) 100 MG tablet Take 1.5 tablets (150 mg total) by mouth daily. 135 tablet 3   No current facility-administered medications on file prior to visit.    Review of Systems:  As per HPI- otherwise negative.   Physical Examination: There were no vitals filed for this visit. There were no vitals filed for this visit. There is no height or weight on file to calculate BMI. Ideal Body Weight:    Spoke with patient over telephone today through interpreter  Assessment and Plan: COVID-19 - Plan: molnupiravir EUA (LAGEVRIO) 200 mg CAPS capsule  Virtual visit today for concern of COVID-19 positive test.  Patient notes moderate symptoms as described above.  She would like to use antivirals, I have called in a prescription.  Discussed supportive care and quarantine protocol.  I have asked her to let me know if not improving in the next few days  Spoke to pt for 11 minutes today  Used sign language interpreter over the phone due to deafness Signed Lamar Blinks, MD

## 2021-04-07 NOTE — Telephone Encounter (Signed)
Called pt and got her triaged. We overbook with [facility] today. At 920 since her original pt no showed.

## 2021-04-07 NOTE — Telephone Encounter (Signed)
Pt called in and tested positive for COVID. We have no openings today, was able to schedule her for tomorrow at 11am vv with Mickel Baas, pt wants to know what can she do until then

## 2021-04-08 ENCOUNTER — Telehealth: Payer: Medicare PPO | Admitting: Family

## 2021-04-08 DIAGNOSIS — Z20828 Contact with and (suspected) exposure to other viral communicable diseases: Secondary | ICD-10-CM | POA: Diagnosis not present

## 2021-05-01 DIAGNOSIS — Z6830 Body mass index (BMI) 30.0-30.9, adult: Secondary | ICD-10-CM | POA: Diagnosis not present

## 2021-05-01 DIAGNOSIS — I10 Essential (primary) hypertension: Secondary | ICD-10-CM | POA: Diagnosis not present

## 2021-05-01 DIAGNOSIS — K1121 Acute sialoadenitis: Secondary | ICD-10-CM | POA: Diagnosis not present

## 2021-05-02 ENCOUNTER — Encounter (HOSPITAL_BASED_OUTPATIENT_CLINIC_OR_DEPARTMENT_OTHER): Payer: Self-pay | Admitting: Emergency Medicine

## 2021-05-02 ENCOUNTER — Other Ambulatory Visit: Payer: Self-pay

## 2021-05-02 ENCOUNTER — Emergency Department (HOSPITAL_BASED_OUTPATIENT_CLINIC_OR_DEPARTMENT_OTHER)
Admission: EM | Admit: 2021-05-02 | Discharge: 2021-05-02 | Disposition: A | Discharge status: Home / Self Care | Payer: Medicare PPO | Attending: Emergency Medicine | Admitting: Emergency Medicine

## 2021-05-02 DIAGNOSIS — K0889 Other specified disorders of teeth and supporting structures: Secondary | ICD-10-CM | POA: Insufficient documentation

## 2021-05-02 DIAGNOSIS — K047 Periapical abscess without sinus: Secondary | ICD-10-CM

## 2021-05-02 MED ORDER — DEXAMETHASONE SODIUM PHOSPHATE 10 MG/ML IJ SOLN
10.0000 mg | Freq: Once | INTRAMUSCULAR | Status: AC
Start: 2021-05-02 — End: 2021-05-02
  Administered 2021-05-02: 10 mg via INTRAMUSCULAR
  Filled 2021-05-02: qty 1

## 2021-05-02 MED ORDER — CHLORHEXIDINE GLUCONATE 0.12 % MT SOLN: 15.0000 mL | Freq: Two times a day (BID) | OROMUCOSAL | 0 refills | Status: DC

## 2021-05-02 MED ORDER — KETOROLAC TROMETHAMINE 60 MG/2ML IM SOLN
60.0000 mg | Freq: Once | INTRAMUSCULAR | Status: AC
  Administered 2021-05-02: 60 mg via INTRAMUSCULAR
  Filled 2021-05-02: qty 2

## 2021-05-02 MED ORDER — PENICILLIN V POTASSIUM 250 MG PO TABS
500.0000 mg | ORAL_TABLET | Freq: Once | ORAL | Status: AC
Start: 2021-05-02 — End: 2021-05-02
  Administered 2021-05-02: 500 mg via ORAL
  Filled 2021-05-02: qty 2

## 2021-05-02 MED ORDER — ACETAMINOPHEN 500 MG PO TABS
1000.0000 mg | ORAL_TABLET | Freq: Once | ORAL | Status: AC
  Administered 2021-05-02: 1000 mg via ORAL
  Filled 2021-05-02: qty 2

## 2021-05-02 MED ORDER — PENICILLIN V POTASSIUM 500 MG PO TABS: 500.0000 mg | ORAL_TABLET | Freq: Four times a day (QID) | ORAL | 0 refills | Status: AC

## 2021-05-02 NOTE — ED Triage Notes (Signed)
ASL interpreter : [id number] Raquel Sarna

## 2021-05-02 NOTE — ED Provider Notes (Signed)
[facility] EMERGENCY DEPARTMENT Provider Note   CSN: [id number] Arrival date & time: 05/02/21  3614     History  No chief complaint on file.   Claudia Gregory is a 61 y.o. female.  The history is provided by the patient. The history is limited by a language barrier. A language interpreter was used.  Dental Pain Location:  Lower Quality:  Aching Severity:  Severe Onset quality:  Gradual Duration: days. Timing:  Constant Progression:  Unchanged Chronicity:  New Context: poor dentition   Context: not recent dental surgery and not trauma   Previous work-up:  Dental exam and filled cavity Relieved by:  NSAIDs Worsened by:  Nothing Ineffective treatments:  NSAIDs Associated symptoms: no congestion and no neck pain   Associated symptoms comment:  Small swelling of lower cheek on left  Risk factors: no alcohol problem       Home Medications Prior to Admission medications   Medication Sig Start Date End Date Taking? Authorizing Provider  Black Cohosh 40 MG CAPS Take by mouth. Patient not taking: Reported on 04/07/2021    [provider]  metFORMIN (GLUCOPHAGE) 500 MG tablet Take 1 tablet (500 mg total) by mouth 2 (two) times daily with a meal. 01/08/21   [place], Gay Filler, MD  methocarbamol (ROBAXIN) 500 MG tablet Take 1 tablet (500 mg total) by mouth every 8 (eight) hours as needed for muscle spasms. Patient not taking: Reported on 04/07/2021 01/07/21   [facility], Gay Filler, MD  rosuvastatin (CRESTOR) 20 MG tablet Take 1 tablet (20 mg total) by mouth daily. Patient not taking: Reported on 04/07/2021 01/08/21   [facility], Gay Filler, MD  sertraline (ZOLOFT) 100 MG tablet Take 1.5 tablets (150 mg total) by mouth daily. 07/22/20   [facility], Gay Filler, MD      Allergies    Patient has no known allergies.    Review of Systems   Review of Systems  HENT:  Positive for dental problem. Negative for congestion.   Eyes:  Negative for redness.  Respiratory:  Negative for  cough.   Cardiovascular:  Negative for leg swelling.  Gastrointestinal:  Negative for vomiting.  Musculoskeletal:  Negative for neck pain.  Skin:  Negative for rash.  Psychiatric/Behavioral:  Negative for agitation.   All other systems reviewed and are negative.  Physical Exam Updated Vital Signs LMP 10/06/2015 (Approximate)  Physical Exam Vitals and nursing note reviewed.  Constitutional:      General: She is not in acute distress.    Appearance: Normal appearance.  HENT:     Head: Normocephalic and atraumatic.     Nose: Nose normal.     Mouth/Throat:     Mouth: Mucous membranes are moist.     Pharynx: Oropharynx is clear.     Comments: No trismus multiple teeth with caries on left premolar back to molars.  No warmth, no erythema no fluctuance  Eyes:     Conjunctiva/sclera: Conjunctivae normal.     Pupils: Pupils are equal, round, and reactive to light.  Cardiovascular:     Rate and Rhythm: Normal rate and regular rhythm.     Pulses: Normal pulses.     Heart sounds: Normal heart sounds.  Pulmonary:     Effort: Pulmonary effort is normal.     Breath sounds: Normal breath sounds.  Abdominal:     General: Bowel sounds are normal.     Palpations: Abdomen is soft.     Tenderness: There is no abdominal tenderness. There is  no guarding.  Musculoskeletal:        General: Normal range of motion.     Cervical back: Normal range of motion and neck supple.  Skin:    General: Skin is warm and dry.     Capillary Refill: Capillary refill takes less than 2 seconds.  Neurological:     General: No focal deficit present.     Mental Status: She is alert and oriented to person, place, and time.     Deep Tendon Reflexes: Reflexes normal.  Psychiatric:        Mood and Affect: Mood normal.        Behavior: Behavior normal.    ED Results / Procedures / Treatments   Labs (all labs ordered are listed, but only abnormal results are displayed) Labs Reviewed - No data to  display  EKG None  Radiology No results found.  Procedures Procedures    Medications Ordered in ED Medications  dexamethasone (DECADRON) injection 10 mg (has no administration in time range)  ketorolac (TORADOL) injection 60 mg (has no administration in time range)  penicillin v potassium (VEETID) tablet 500 mg (500 mg Oral Given 05/02/21 0642)  acetaminophen (TYLENOL) tablet 1,000 mg (1,000 mg Oral Given 05/02/21 1245)    ED Course/ Medical Decision Making/ A&P                           Medical Decision Making Swlling lateral to the lip in lower cheek.  Started on doxy one dose taken.  Patient feels swelling worse.   Risk OTC drugs. Prescription drug management. Risk Details: Minimal cheek swelling.  No trismus, no floor of the mouth swelling.  No signs of cheek abscess.  Patient is not on the correct antibiotics for dental infection.  No PCN allergy.  Will given IM toradol and steroids to take down swelling and pain.  Patient has multiple teeth in need of evaluation by a dentist.  Patient informed to add tylenol to her pain regimen and to call dentist on Monday am.  Strict return precautions given.      Final Clinical Impression(s) / ED Diagnoses Final diagnoses:  None   Return for intractable cough, coughing up blood, fevers > 100.4 unrelieved by medication, shortness of breath, intractable vomiting, chest pain, shortness of breath, weakness, numbness, changes in speech, facial asymmetry, abdominal pain, passing out, Inability to tolerate liquids or food, cough, altered mental status or any concerns. No signs of systemic illness or infection. The patient is nontoxic-appearing on exam and vital signs are within normal limits.  I have reviewed the triage vital signs and the nursing notes. Pertinent labs & imaging results that were available during my care of the patient were reviewed by me and considered in my medical decision making (see chart for details). After history, exam,  and medical workup I feel the patient has been appropriately medically screened and is safe for discharge home. Pertinent diagnoses were discussed with the patient. Patient was given return precautions.       Vasily Fedewa, MD 05/02/21 [phone]

## 2021-05-02 NOTE — ED Triage Notes (Signed)
Seen at [facility] yesterday for L sided facial swelling, was medicated, but states her swelling is worse. Pt states lip swelling is new, and facial swelling is worse. Pt also mentions a HA. Denies fever. Unsure of dental issue to the area but states she does have gum sensitivity in the area.

## 2021-07-04 NOTE — Patient Instructions (Addendum)
It was good to see you again today- I will be in touch with your labs and pap ?Please see me in about 6 months and take care - unless your labs indicate otherwise ?Ok to use clonazepam as needed for anxiety- limit use as this can be habit forming, but ok to use when you have severe anxiety!  It may cause you to feel drowsy  ?

## 2021-07-04 NOTE — Progress Notes (Addendum)
Therapist, music at [facility] ?[address], [address] ?[place], [place] [address] ?336 [phone] ?Fax [phone] ? ?Date:  07/08/2021  ? ?Name:  Claudia Gregory   DOB:  03/06/1961   MRN:  [id number] ? ?PCP:  Darreld Mclean, MD  ? ? ?Chief Complaint: Medical Management of Chronic Issues (6 m f/u ) ? ? ?History of Present Illness: ? ?Claudia Gregory is a 61 y.o. very pleasant female patient who presents with the following: ? ?Here today for a 6 month visit  ?Last seen by myself in October, but we did a virtual visit in January when she had covid ?She was in the ER late January for dental infection  ?History of deafness, hypertension, GERD, hyperlipidemia, obesity, peripheral neuropathy, diabetes, fatty liver with elevation of LFT ?Married to [place] who is also here today  ?Also seen with a ASL interpreter ? ?She has started taking some CBD for anxiety and thinks it helps some  ?She went back on sertraline 100 mg as she felt forgetful on the 150 ?She has noted more anxiety for the last 2 months approx due to some issues iwht her son and other concerns  ?She is not interested in therapy right now ?She is not struggling with depression or any SI at this time   ? ?She has lost some weight- not really trying but might be anxiety or perhaps diabetes might be worse ?Her last pap was ascus with negative HPV- recheck in 3 years but she would like to repeat today which is fine  ?We have noted hypercalcemia in the past but normal when corrected for her albumin    ? ?She is trying to walk for exercise ? ?Wt Readings from Last 3 Encounters:  ?07/08/21 179 lb 9.6 oz (81.5 kg)  ?04/07/21 187 lb (84.8 kg)  ?01/13/21 189 lb 9.6 oz (86 kg)  ? ? ?Lab Results  ?Component Value Date  ? HGBA1C 8.1 (H) 01/07/2021  ? ?Shingrix- she just did one due to a reaction ?Covid booster ?Eye exam- next month  ?Urine micro  ? ?Just taking metformin BID right now  ?Patient Active Problem List  ? Diagnosis Date Noted  ? Controlled type 2  diabetes mellitus without complication, without long-term current use of insulin ([place]) 07/11/2019  ? Generalized anxiety disorder 12/28/2015  ? Elevated liver enzymes 12/28/2015  ? Plantar fasciitis of right foot 04/01/2015  ? Neuropathic pain of both feet 10/27/2014  ? Hx of adenomatous polyp of colon 08/08/2014  ? Deafness 08/04/2014  ? GERD (gastroesophageal reflux disease) 05/13/2014  ? Hyperlipidemia 02/05/2013  ? Hot flashes 11/24/2012  ? OBESITY 10/23/2008  ? Hypertension 10/23/2008  ? BIPOLAR DISORDER UNSPECIFIED 09/02/2008  ? ? ?Past Medical History:  ?Diagnosis Date  ? Anxiety   ? Bipolar 1 disorder ([place])   ? Carpal tunnel syndrome of left wrist   ? Chest pain, atypical   ? Deaf   ? Depression   ? Diabetes mellitus without complication ([place])   ? past hx- off meds   ? FATIGUE   ? Gallstones   ? GERD (gastroesophageal reflux disease)   ? Hx of adenomatous polyp of colon 08/08/2014  ? Hyperlipidemia   ? Hypertension   ? OBESITY   ? Pneumonia   ? ? ?Past Surgical History:  ?Procedure Laterality Date  ? CHOLECYSTECTOMY  2000  ? COLONOSCOPY  2016  ? 5 mm adenoma  ? POLYPECTOMY    ? TUBAL LIGATION  1998  ?  UPPER GASTROINTESTINAL ENDOSCOPY    ? ? ?Social History  ? ?Tobacco Use  ? Smoking status: Never  ? Smokeless tobacco: Never  ?Vaping Use  ? Vaping Use: Never used  ?Substance Use Topics  ? Alcohol use: Yes  ?  Alcohol/week: 2.0 standard drinks  ?  Types: 2 Glasses of wine per week  ?  Comment: rare glass of wine   ? Drug use: No  ? ? ?Family History  ?Problem Relation Age of Onset  ? Brain cancer Mother   ? Hypertension Mother   ? Diabetes Mother   ? Diabetes Maternal Aunt   ? Heart attack Maternal Grandmother   ? Hypertension Maternal Grandmother   ? Colon cancer Neg Hx   ? Rectal cancer Neg Hx   ? Stomach cancer Neg Hx   ? Colon polyps Neg Hx   ? Esophageal cancer Neg Hx   ? ? ?No Known Allergies ? ?Medication list has been reviewed and updated. ? ?Current Outpatient Medications on File Prior to Visit   ?Medication Sig Dispense Refill  ? losartan-hydrochlorothiazide (HYZAAR) 50-12.5 MG tablet Take 1 tablet by mouth daily.    ? Melatonin 10 MG TABS Take by mouth. 2 tabs nightly    ? metFORMIN (GLUCOPHAGE) 500 MG tablet Take 1 tablet (500 mg total) by mouth 2 (two) times daily with a meal. 180 tablet 3  ? rosuvastatin (CRESTOR) 20 MG tablet Take 1 tablet (20 mg total) by mouth daily. 90 tablet 3  ? sertraline (ZOLOFT) 100 MG tablet Take 1.5 tablets (150 mg total) by mouth daily. 135 tablet 3  ? ?No current facility-administered medications on file prior to visit.  ? ? ?Review of Systems: ? ?As per HPI- otherwise negative. ? ? ?Physical Examination: ?Vitals:  ? 07/08/21 1409  ?BP: 136/70  ?Pulse: 70  ?Temp: (!) 97.5 ?F (36.4 ?C)  ?SpO2: 98%  ? ?Vitals:  ? 07/08/21 1409  ?Weight: 179 lb 9.6 oz (81.5 kg)  ? ?Body mass index is 28.99 kg/m?. ?Ideal Body Weight:   ? ?GEN: no acute distress. Overweight, looks well  ?HEENT: Atraumatic, Normocephalic.  Bilateral TM wnl, oropharynx normal.  PEERL,EOMI.   ?Ears and Nose: No external deformity. ?CV: RRR, No M/G/R. No JVD. No thrill. No extra heart sounds. ?PULM: CTA B, no wheezes, crackles, rhonchi. No retractions. No resp. distress. No accessory muscle use. ?ABD: S, NT, ND, +BS. No rebound. No HSM. ?EXTR: No c/c/e ?PSYCH: Normally interactive. Conversant.  ?Pelvic: normal, no vaginal lesions or discharge. Uterus normal, no CMT, no adnexal tendereness or masses ? ? ?Assessment and Plan: ?Essential hypertension - Plan: Comprehensive metabolic panel, CANCELED: Comprehensive metabolic panel ? ?Elevated liver enzymes - Plan: Comprehensive metabolic panel, CANCELED: Comprehensive metabolic panel ? ?Controlled type 2 diabetes mellitus without complication, without long-term current use of insulin ([place]) - Plan: Hemoglobin A1c, CANCELED: Hemoglobin A1c ? ?Generalized anxiety disorder - Plan: clonazePAM (KLONOPIN) 0.5 MG tablet ? ?Screening for cervical cancer - Plan: Cytology - PAP ?Pt  seen today for follow-up ?She notes more anxiety recently ?She has tried several different SSRI, etc- she has been overall happy with sertraline and does not wish to stop use ?Will have her use low dose klonopin as needed for acute anxiety  ?Will plan further follow- up pending labs. ? ? ?Signed ?Lamar Blinks, MD ? ?Received labs as below 4/7- message to pt ? ?Results for orders placed or performed in visit on 07/08/21  ?Comprehensive metabolic panel  ?Result Value Ref Range  ?  Glucose, Bld 233 (H) 65 - 99 mg/dL  ? BUN 15 7 - 25 mg/dL  ? Creat 0.55 0.50 - 1.03 mg/dL  ? BUN/Creatinine Ratio NOT APPLICABLE 6 - 22 (calc)  ? Sodium 137 135 - 146 mmol/L  ? Potassium 3.7 3.5 - 5.3 mmol/L  ? Chloride 99 98 - 110 mmol/L  ? CO2 25 20 - 32 mmol/L  ? Calcium 10.6 (H) 8.6 - 10.4 mg/dL  ? Total Protein 7.8 6.1 - 8.1 g/dL  ? Albumin 5.0 3.6 - 5.1 g/dL  ? Globulin 2.8 1.9 - 3.7 g/dL (calc)  ? AG Ratio 1.8 1.0 - 2.5 (calc)  ? Total Bilirubin 0.4 0.2 - 1.2 mg/dL  ? Alkaline phosphatase (APISO) 86 37 - 153 U/L  ? AST 33 10 - 35 U/L  ? ALT 46 (H) 6 - 29 U/L  ?Hemoglobin A1c  ?Result Value Ref Range  ? Hgb A1c MFr Bld 10.7 (H) <5.7 % of total Hgb  ? Mean Plasma Glucose 260 mg/dL  ? eAG (mmol/L) 14.4 mmol/L  ? ? ? ?

## 2021-07-08 ENCOUNTER — Encounter: Payer: Self-pay | Admitting: Family Medicine

## 2021-07-08 ENCOUNTER — Other Ambulatory Visit (HOSPITAL_COMMUNITY)
Admission: RE | Admit: 2021-07-08 | Discharge: 2021-07-08 | Disposition: A | Discharge status: Home / Self Care | Payer: BC Managed Care – PPO | Source: Ambulatory Visit | Attending: Family Medicine | Admitting: Family Medicine

## 2021-07-08 ENCOUNTER — Ambulatory Visit: Payer: BC Managed Care – PPO | Admitting: Family Medicine

## 2021-07-08 VITALS — BP 136/70 | HR 70 | Temp 97.5°F | Wt 179.6 lb

## 2021-07-08 DIAGNOSIS — Z124 Encounter for screening for malignant neoplasm of cervix: Secondary | ICD-10-CM

## 2021-07-08 DIAGNOSIS — I10 Essential (primary) hypertension: Secondary | ICD-10-CM

## 2021-07-08 DIAGNOSIS — E119 Type 2 diabetes mellitus without complications: Secondary | ICD-10-CM

## 2021-07-08 DIAGNOSIS — R748 Abnormal levels of other serum enzymes: Secondary | ICD-10-CM

## 2021-07-08 DIAGNOSIS — F411 Generalized anxiety disorder: Secondary | ICD-10-CM | POA: Diagnosis not present

## 2021-07-08 MED ORDER — CLONAZEPAM 0.5 MG PO TABS: 0.2500 mg | ORAL_TABLET | Freq: Two times a day (BID) | ORAL | 1 refills | Status: DC | PRN

## 2021-07-09 ENCOUNTER — Encounter: Payer: Self-pay | Admitting: Family Medicine

## 2021-07-09 DIAGNOSIS — E119 Type 2 diabetes mellitus without complications: Secondary | ICD-10-CM

## 2021-07-09 LAB — COMPREHENSIVE METABOLIC PANEL
AG Ratio: 1.8 (calc) (ref 1.0–2.5)
ALT: 46 U/L — ABNORMAL HIGH (ref 6–29)
AST: 33 U/L (ref 10–35)
Albumin: 5 g/dL (ref 3.6–5.1)
Alkaline phosphatase (APISO): 86 U/L (ref 37–153)
BUN: 15 mg/dL (ref 7–25)
CO2: 25 mmol/L (ref 20–32)
Calcium: 10.6 mg/dL — ABNORMAL HIGH (ref 8.6–10.4)
Chloride: 99 mmol/L (ref 98–110)
Creat: 0.55 mg/dL (ref 0.50–1.03)
Globulin: 2.8 g/dL (calc) (ref 1.9–3.7)
Glucose, Bld: 233 mg/dL — ABNORMAL HIGH (ref 65–99)
Potassium: 3.7 mmol/L (ref 3.5–5.3)
Sodium: 137 mmol/L (ref 135–146)
Total Bilirubin: 0.4 mg/dL (ref 0.2–1.2)
Total Protein: 7.8 g/dL (ref 6.1–8.1)

## 2021-07-09 LAB — HEMOGLOBIN A1C
Hgb A1c MFr Bld: 10.7 % of total Hgb — ABNORMAL HIGH (ref <5.7)
Mean Plasma Glucose: 260 mg/dL
eAG (mmol/L): 14.4 mmol/L

## 2021-07-13 LAB — CYTOLOGY - PAP
Adequacy: ABSENT
Comment: NEGATIVE
Diagnosis: NEGATIVE
High risk HPV: NEGATIVE

## 2021-07-14 ENCOUNTER — Other Ambulatory Visit (HOSPITAL_COMMUNITY): Payer: Self-pay

## 2021-07-14 ENCOUNTER — Encounter: Payer: Self-pay | Admitting: Family Medicine

## 2021-07-14 MED ORDER — DAPAGLIFLOZIN PROPANEDIOL 10 MG PO TABS: 10.0000 mg | ORAL_TABLET | Freq: Every day | ORAL | 6 refills | Status: DC

## 2021-08-24 ENCOUNTER — Encounter: Payer: Self-pay | Admitting: Family Medicine

## 2021-08-24 DIAGNOSIS — E119 Type 2 diabetes mellitus without complications: Secondary | ICD-10-CM

## 2021-08-24 MED ORDER — METFORMIN HCL 500 MG PO TABS: ORAL_TABLET | ORAL | 3 refills | Status: DC

## 2021-09-30 ENCOUNTER — Other Ambulatory Visit: Payer: Self-pay | Admitting: Family Medicine

## 2021-09-30 DIAGNOSIS — F411 Generalized anxiety disorder: Secondary | ICD-10-CM

## 2021-10-12 ENCOUNTER — Other Ambulatory Visit: Payer: Self-pay | Admitting: Family Medicine

## 2021-10-12 DIAGNOSIS — E119 Type 2 diabetes mellitus without complications: Secondary | ICD-10-CM

## 2021-10-12 MED ORDER — METFORMIN HCL 500 MG PO TABS: ORAL_TABLET | ORAL | 3 refills | Status: DC

## 2021-10-14 ENCOUNTER — Other Ambulatory Visit: Payer: Self-pay | Admitting: Family Medicine

## 2021-10-14 DIAGNOSIS — E119 Type 2 diabetes mellitus without complications: Secondary | ICD-10-CM

## 2021-10-14 MED ORDER — METFORMIN HCL 500 MG PO TABS: ORAL_TABLET | ORAL | 3 refills | Status: DC

## 2021-11-05 ENCOUNTER — Encounter: Payer: Self-pay | Admitting: Family Medicine

## 2021-11-05 MED ORDER — LOSARTAN POTASSIUM-HCTZ 50-12.5 MG PO TABS: 1.0000 | ORAL_TABLET | Freq: Every day | ORAL | 1 refills | Status: DC

## 2021-12-09 ENCOUNTER — Encounter: Payer: BC Managed Care – PPO | Admitting: Family Medicine

## 2021-12-10 ENCOUNTER — Telehealth: Payer: Self-pay | Admitting: Family Medicine

## 2021-12-10 NOTE — Telephone Encounter (Signed)
Patient calling about medication and states that insurance will not cover 30 days & are for some reason sending in 90 days for her. She cannot pay the $180 for the 90 days.  She likes this medication and would like to continue with it but she cannot pay for that.  Patient only has a 2 pills left of this medication and is not sure what to do   Please advise

## 2021-12-13 NOTE — Telephone Encounter (Signed)
Do you recall what medication this was about?

## 2021-12-14 ENCOUNTER — Encounter: Payer: Self-pay | Admitting: Pharmacist

## 2021-12-14 NOTE — Telephone Encounter (Signed)
Oops! I apologize I didn't list it! I believe it was   dapagliflozin propanediol (FARXIGA) 10 MG TABS tablet [[id number]]

## 2021-12-14 NOTE — Telephone Encounter (Signed)
Call patient and spoke with her thru interupter service. She states her Rx insurance [employer] will only allow 90 days and cost is $181 for 90 days.  She does not want to get 90 days because she has follow up in 30 days and is concerned that medication will change.  Offer coupon care for 30 days free trial. Called to [facility]. Cost for 30 days of [place]ade will be $0.   When patient comes in next month can provide another coupon card that should lower cost for 90 days supply by up to $150

## 2021-12-14 NOTE — Telephone Encounter (Signed)
Please see pts issue obtaining Farxiga. Do you have any suggestions? Should we ask Tammy?

## 2022-01-02 NOTE — Progress Notes (Deleted)
[place] at [facility] [address] [place], [place] [address] [phone]  Date:  01/06/2022   Name:  Claudia Gregory   DOB:  07-13-1960   MRN:  [id number]  PCP:  Darreld Mclean, MD    Chief Complaint: No chief complaint on file.   History of Present Illness:  Claudia Gregory is a 61 y.o. very pleasant female patient who presents with the following:  Patient seen today for physical exam Most recent visit with myself was in April History of deafness, hypertension, GERD, hyperlipidemia, obesity, peripheral neuropathy, diabetes, fatty liver with elevation of LFT Married to [place] who is also here today  Also seen with a ASL interpreter  She enjoys walking for exercise  Urine microalbumin is due Eye exam Recommend COVID-vaccine Flu shot Can do foot exam today ?  Second dose of Shingrix Mammogram appears to be due Pap smear up-to-date Colonoscopy 2021  Clonazepam as needed Farxiga 10 Metformin [phone] a.m., 500 p.m. Crestor 20 Sertraline Losartan/HCTZ  Most recent A1c was significantly elevated in April-we increased her dose of metformin and started Farxiga Lab Results  Component Value Date   HGBA1C 10.7 (H) 07/08/2021    Patient Active Problem List   Diagnosis Date Noted   Controlled type 2 diabetes mellitus without complication, without long-term current use of insulin ([place]) 07/11/2019   Generalized anxiety disorder 12/28/2015   Elevated liver enzymes 12/28/2015   Plantar fasciitis of right foot 04/01/2015   Neuropathic pain of both feet 10/27/2014   Hx of adenomatous polyp of colon 08/08/2014   Deafness 08/04/2014   GERD (gastroesophageal reflux disease) 05/13/2014   Hyperlipidemia 02/05/2013   Hot flashes 11/24/2012   OBESITY 10/23/2008   Hypertension 10/23/2008   BIPOLAR DISORDER UNSPECIFIED 09/02/2008    Past Medical History:  Diagnosis Date   Anxiety    Bipolar 1 disorder ([place])    Carpal  tunnel syndrome of left wrist    Chest pain, atypical    Deaf    Depression    Diabetes mellitus without complication ([place])    past hx- off meds    FATIGUE    Gallstones    GERD (gastroesophageal reflux disease)    Hx of adenomatous polyp of colon 08/08/2014   Hyperlipidemia    Hypertension    OBESITY    Pneumonia     Past Surgical History:  Procedure Laterality Date   CHOLECYSTECTOMY  2000   COLONOSCOPY  2016   5 mm adenoma   POLYPECTOMY     TUBAL LIGATION  1998   UPPER GASTROINTESTINAL ENDOSCOPY      Social History   Tobacco Use   Smoking status: Never   Smokeless tobacco: Never  Vaping Use   Vaping Use: Never used  Substance Use Topics   Alcohol use: Yes    Alcohol/week: 2.0 standard drinks of alcohol    Types: 2 Glasses of wine per week    Comment: rare glass of wine    Drug use: No    Family History  Problem Relation Age of Onset   Brain cancer Mother    Hypertension Mother    Diabetes Mother    Diabetes Maternal Aunt    Heart attack Maternal Grandmother    Hypertension Maternal Grandmother    Colon cancer Neg Hx    Rectal cancer Neg Hx    Stomach cancer Neg Hx    Colon polyps Neg Hx    Esophageal cancer Neg  Hx     No Known Allergies  Medication list has been reviewed and updated.  Current Outpatient Medications on File Prior to Visit  Medication Sig Dispense Refill   clonazePAM (KLONOPIN) 0.5 MG tablet Take 0.5-1 tablets (0.25-0.5 mg total) by mouth 2 (two) times daily as needed for anxiety. 30 tablet 1   dapagliflozin propanediol (FARXIGA) 10 MG TABS tablet Take 1 tablet (10 mg total) by mouth daily before breakfast. 30 tablet 6   losartan-hydrochlorothiazide (HYZAAR) 50-12.5 MG tablet Take 1 tablet by mouth daily. 90 tablet 1   Melatonin 10 MG TABS Take by mouth. 2 tabs nightly     metFORMIN (GLUCOPHAGE) 500 MG tablet Take 1000 mg by mouth each morning and '500mg'$  by mouth each evening 270 tablet 3   rosuvastatin (CRESTOR) 20 MG tablet Take 1  tablet (20 mg total) by mouth daily. 90 tablet 3   sertraline (ZOLOFT) 100 MG tablet TAKE 1 AND 1/2 TABLETS(150 MG) BY MOUTH DAILY 135 tablet 3   No current facility-administered medications on file prior to visit.    Review of Systems:  As per HPI- otherwise negative.   Physical Examination: There were no vitals filed for this visit. There were no vitals filed for this visit. There is no height or weight on file to calculate BMI. Ideal Body Weight:    GEN: no acute distress. HEENT: Atraumatic, Normocephalic.  Ears and Nose: No external deformity. CV: RRR, No M/G/R. No JVD. No thrill. No extra heart sounds. PULM: CTA B, no wheezes, crackles, rhonchi. No retractions. No resp. distress. No accessory muscle use. ABD: S, NT, ND, +BS. No rebound. No HSM. EXTR: No c/c/e PSYCH: Normally interactive. Conversant.  Foot exam  Assessment and Plan: *** Physical exam today.  Encouraged healthy diet and exercise routine Will plan further follow- up pending labs.  Signed Lamar Blinks, MD

## 2022-01-06 ENCOUNTER — Encounter: Payer: BC Managed Care – PPO | Admitting: Family Medicine

## 2022-01-06 DIAGNOSIS — Z0000 Encounter for general adult medical examination without abnormal findings: Secondary | ICD-10-CM

## 2022-01-06 DIAGNOSIS — F411 Generalized anxiety disorder: Secondary | ICD-10-CM

## 2022-01-06 DIAGNOSIS — I10 Essential (primary) hypertension: Secondary | ICD-10-CM

## 2022-01-06 DIAGNOSIS — R748 Abnormal levels of other serum enzymes: Secondary | ICD-10-CM

## 2022-01-06 DIAGNOSIS — E785 Hyperlipidemia, unspecified: Secondary | ICD-10-CM

## 2022-01-06 DIAGNOSIS — E119 Type 2 diabetes mellitus without complications: Secondary | ICD-10-CM

## 2022-01-07 NOTE — Progress Notes (Signed)
[place] at [facility] [address], [place], [place] [address] [phone]  Date:  01/10/2022   Name:  Claudia Gregory   DOB:  26-Nov-1960   MRN:  [id number]  PCP:  Darreld Mclean, MD    Chief Complaint: Annual Exam (Here Cpe)   History of Present Illness:  Claudia Gregory is a 61 y.o. very pleasant female patient who presents with the following:  Patient seen today for physical exam Most recent visit with myself was in April History of deafness, hypertension, GERD, hyperlipidemia, obesity, peripheral neuropathy, diabetes, fatty liver with elevation of LFT Married to Claudia Gregory Patient is seen today with sign language interpreter  In April her A1c was high, 10.7% We had her increase metformin and added Farxiga Pap was normal  Needs microalbumin Eye exam- done in June Mammogram, colonoscopy up-to-date Flu shot- give today  Foot exam A1c can be updated She has not really been trying to lose weight but is down several lbs-this may be due to [place].  She does not typically check her blood sugars but wonders if she is having some low sugars.  She may have times that she feels a bit shaky and wants to eat something  Wt Readings from Last 3 Encounters:  01/10/22 172 lb 9.6 oz (78.3 kg)  07/08/21 179 lb 9.6 oz (81.5 kg)  04/07/21 187 lb (84.8 kg)   She does not check her blood sugar generally   Patient Active Problem List   Diagnosis Date Noted   Controlled type 2 diabetes mellitus without complication, without long-term current use of insulin ([place]) 07/11/2019   Generalized anxiety disorder 12/28/2015   Elevated liver enzymes 12/28/2015   Plantar fasciitis of right foot 04/01/2015   Neuropathic pain of both feet 10/27/2014   Hx of adenomatous polyp of colon 08/08/2014   Deafness 08/04/2014   GERD (gastroesophageal reflux disease) 05/13/2014   Hyperlipidemia 02/05/2013   Hot flashes 11/24/2012   OBESITY 10/23/2008    Hypertension 10/23/2008   BIPOLAR DISORDER UNSPECIFIED 09/02/2008    Past Medical History:  Diagnosis Date   Anxiety    Bipolar 1 disorder ([place])    Carpal tunnel syndrome of left wrist    Chest pain, atypical    Deaf    Depression    Diabetes mellitus without complication ([place])    past hx- off meds    FATIGUE    Gallstones    GERD (gastroesophageal reflux disease)    Hx of adenomatous polyp of colon 08/08/2014   Hyperlipidemia    Hypertension    OBESITY    Pneumonia     Past Surgical History:  Procedure Laterality Date   CHOLECYSTECTOMY  2000   COLONOSCOPY  2016   5 mm adenoma   POLYPECTOMY     TUBAL LIGATION  1998   UPPER GASTROINTESTINAL ENDOSCOPY      Social History   Tobacco Use   Smoking status: Never   Smokeless tobacco: Never  Vaping Use   Vaping Use: Never used  Substance Use Topics   Alcohol use: Yes    Alcohol/week: 2.0 standard drinks of alcohol    Types: 2 Glasses of wine per week    Comment: rare glass of wine    Drug use: No    Family History  Problem Relation Age of Onset   Brain cancer Mother    Hypertension Mother    Diabetes Mother    Diabetes Maternal Aunt  Heart attack Maternal Grandmother    Hypertension Maternal Grandmother    Colon cancer Neg Hx    Rectal cancer Neg Hx    Stomach cancer Neg Hx    Colon polyps Neg Hx    Esophageal cancer Neg Hx     No Known Allergies  Medication list has been reviewed and updated.  Current Outpatient Medications on File Prior to Visit  Medication Sig Dispense Refill   clonazePAM (KLONOPIN) 0.5 MG tablet Take 0.5-1 tablets (0.25-0.5 mg total) by mouth 2 (two) times daily as needed for anxiety. 30 tablet 1   dapagliflozin propanediol (FARXIGA) 10 MG TABS tablet Take 1 tablet (10 mg total) by mouth daily before breakfast. 30 tablet 6   losartan-hydrochlorothiazide (HYZAAR) 50-12.5 MG tablet Take 1 tablet by mouth daily. 90 tablet 1   Melatonin 10 MG TABS Take by mouth. 2 tabs nightly      metFORMIN (GLUCOPHAGE) 500 MG tablet Take 1000 mg by mouth each morning and '500mg'$  by mouth each evening 270 tablet 3   rosuvastatin (CRESTOR) 20 MG tablet Take 1 tablet (20 mg total) by mouth daily. 90 tablet 3   sertraline (ZOLOFT) 100 MG tablet TAKE 1 AND 1/2 TABLETS(150 MG) BY MOUTH DAILY 135 tablet 3   No current facility-administered medications on file prior to visit.    Review of Systems:  As per HPI- otherwise negative.   Physical Examination: Vitals:   01/10/22 1419  BP: 134/72  Pulse: (!) 57  Resp: 16  Temp: 97.8 F (36.6 C)  SpO2: 97%   Vitals:   01/10/22 1419  Weight: 172 lb 9.6 oz (78.3 kg)  Height: '5\' 6"'$  (1.676 m)   Body mass index is 27.86 kg/m. Ideal Body Weight: Weight in (lb) to have BMI = 25: 154.6  GEN: no acute distress.  Overweight, looks well HEENT: Atraumatic, Normocephalic.  Ears and Nose: No external deformity. CV: RRR, No M/G/R. No JVD. No thrill. No extra heart sounds. PULM: CTA B, no wheezes, crackles, rhonchi. No retractions. No resp. distress. No accessory muscle use. ABD: S, NT, ND, +BS. No rebound. No HSM. EXTR: No c/c/e PSYCH: Normally interactive. Conversant.  Foot exam done today   Assessment and Plan: Physical exam  Controlled type 2 diabetes mellitus without complication, without long-term current use of insulin ([place]) - Plan: Comprehensive metabolic panel, Hemoglobin A1c, Microalbumin / creatinine urine ratio, dapagliflozin propanediol (FARXIGA) 10 MG TABS tablet  Essential hypertension - Plan: CBC, Comprehensive metabolic panel  Elevated liver enzymes - Plan: Comprehensive metabolic panel  Generalized anxiety disorder  Hyperlipidemia, unspecified hyperlipidemia type - Plan: Lipid panel  Screening for thyroid disorder - Plan: TSH  Need for influenza vaccination - Plan: Flu Vaccine QUAD 6+ mos PF IM (Fluarix Quad PF)  Physical exam today.  Encouraged healthy diet and exercise routine  She is considering a CT coronary but  would like to see how her lipids look first  We added Farxiga earlier this year, will check A1c result Blood pressure under good control Will plan further follow- up pending labs.  Signed Lamar Blinks, MD  Addendum 10/10, received labs as below.  Message to patient  Results for orders placed or performed in visit on 01/10/22  CBC  Result Value Ref Range   WBC 7.3 4.0 - 10.5 K/uL   RBC 4.75 3.87 - 5.11 Mil/uL   Platelets 215.0 150.0 - 400.0 K/uL   Hemoglobin 13.8 12.0 - 15.0 g/dL   HCT 40.2 36.0 - 46.0 %  MCV 84.6 78.0 - 100.0 fl   MCHC 34.4 30.0 - 36.0 g/dL   RDW 13.7 11.5 - 15.5 %  Comprehensive metabolic panel  Result Value Ref Range   Sodium 140 135 - 145 mEq/L   Potassium 4.3 3.5 - 5.1 mEq/L   Chloride 103 96 - 112 mEq/L   CO2 29 19 - 32 mEq/L   Glucose, Bld 101 (H) 70 - 99 mg/dL   BUN 11 6 - 23 mg/dL   Creatinine, Ser 0.54 0.40 - 1.20 mg/dL   Total Bilirubin 0.4 0.2 - 1.2 mg/dL   Alkaline Phosphatase 62 39 - 117 U/L   AST 18 0 - 37 U/L   ALT 27 0 - 35 U/L   Total Protein 7.3 6.0 - 8.3 g/dL   Albumin 4.7 3.5 - 5.2 g/dL   GFR 101.16 >60.00 mL/min   Calcium 10.3 8.4 - 10.5 mg/dL  Hemoglobin A1c  Result Value Ref Range   Hgb A1c MFr Bld 6.7 (H) 4.6 - 6.5 %  Lipid panel  Result Value Ref Range   Cholesterol 145 0 - 200 mg/dL   Triglycerides 161.0 (H) 0.0 - 149.0 mg/dL   HDL 54.60 >39.00 mg/dL   VLDL 32.2 0.0 - 40.0 mg/dL   LDL Cholesterol 58 0 - 99 mg/dL   Total CHOL/HDL Ratio 3    NonHDL 90.27   Microalbumin / creatinine urine ratio  Result Value Ref Range   Microalb, Ur <0.7 0.0 - 1.9 mg/dL   Creatinine,U 45.5 mg/dL   Microalb Creat Ratio 1.5 0.0 - 30.0 mg/g  TSH  Result Value Ref Range   TSH 3.31 0.35 - 5.50 uIU/mL

## 2022-01-10 ENCOUNTER — Encounter: Payer: Self-pay | Admitting: Family Medicine

## 2022-01-10 ENCOUNTER — Ambulatory Visit (INDEPENDENT_AMBULATORY_CARE_PROVIDER_SITE_OTHER): Payer: BC Managed Care – PPO | Admitting: Family Medicine

## 2022-01-10 VITALS — BP 134/72 | HR 57 | Temp 97.8°F | Resp 16 | Ht 66.0 in | Wt 172.6 lb

## 2022-01-10 DIAGNOSIS — Z0000 Encounter for general adult medical examination without abnormal findings: Secondary | ICD-10-CM

## 2022-01-10 DIAGNOSIS — I10 Essential (primary) hypertension: Secondary | ICD-10-CM | POA: Diagnosis not present

## 2022-01-10 DIAGNOSIS — R748 Abnormal levels of other serum enzymes: Secondary | ICD-10-CM | POA: Diagnosis not present

## 2022-01-10 DIAGNOSIS — E119 Type 2 diabetes mellitus without complications: Secondary | ICD-10-CM | POA: Diagnosis not present

## 2022-01-10 DIAGNOSIS — Z1329 Encounter for screening for other suspected endocrine disorder: Secondary | ICD-10-CM

## 2022-01-10 DIAGNOSIS — E785 Hyperlipidemia, unspecified: Secondary | ICD-10-CM

## 2022-01-10 DIAGNOSIS — Z23 Encounter for immunization: Secondary | ICD-10-CM | POA: Diagnosis not present

## 2022-01-10 DIAGNOSIS — F411 Generalized anxiety disorder: Secondary | ICD-10-CM

## 2022-01-10 MED ORDER — DAPAGLIFLOZIN PROPANEDIOL 10 MG PO TABS: 10.0000 mg | ORAL_TABLET | Freq: Every day | ORAL | 3 refills | Status: DC

## 2022-01-10 NOTE — Patient Instructions (Signed)
Great to see you today- I will be in touch with your labs Flu shot today Recommend covid shot this fall We will see how your A1c looks- if needed I will get you set up for glucose monitoring! We can also consider getting a CT coronary calcium score to check for early heart disease if you like   Assuming all is well please see me in 6 months

## 2022-01-11 ENCOUNTER — Encounter: Payer: Self-pay | Admitting: Family Medicine

## 2022-01-11 LAB — COMPREHENSIVE METABOLIC PANEL
ALT: 27 U/L (ref 0–35)
AST: 18 U/L (ref 0–37)
Albumin: 4.7 g/dL (ref 3.5–5.2)
Alkaline Phosphatase: 62 U/L (ref 39–117)
BUN: 11 mg/dL (ref 6–23)
CO2: 29 mEq/L (ref 19–32)
Calcium: 10.3 mg/dL (ref 8.4–10.5)
Chloride: 103 mEq/L (ref 96–112)
Creatinine, Ser: 0.54 mg/dL (ref 0.40–1.20)
GFR: 101.16 mL/min (ref >60.00)
Glucose, Bld: 101 mg/dL — ABNORMAL HIGH (ref 70–99)
Potassium: 4.3 mEq/L (ref 3.5–5.1)
Sodium: 140 mEq/L (ref 135–145)
Total Bilirubin: 0.4 mg/dL (ref 0.2–1.2)
Total Protein: 7.3 g/dL (ref 6.0–8.3)

## 2022-01-11 LAB — CBC
HCT: 40.2 % (ref 36.0–46.0)
Hemoglobin: 13.8 g/dL (ref 12.0–15.0)
MCHC: 34.4 g/dL (ref 30.0–36.0)
MCV: 84.6 fl (ref 78.0–100.0)
Platelets: 215 10*3/uL (ref 150.0–400.0)
RBC: 4.75 Mil/uL (ref 3.87–5.11)
RDW: 13.7 % (ref 11.5–15.5)
WBC: 7.3 10*3/uL (ref 4.0–10.5)

## 2022-01-11 LAB — HEMOGLOBIN A1C: Hgb A1c MFr Bld: 6.7 % — ABNORMAL HIGH (ref 4.6–6.5)

## 2022-01-11 LAB — MICROALBUMIN / CREATININE URINE RATIO
Creatinine,U: 45.5 mg/dL
Microalb Creat Ratio: 1.5 mg/g (ref 0.0–30.0)
Microalb, Ur: <0.7 mg/dL (ref 0.0–1.9)

## 2022-01-11 LAB — LIPID PANEL
Cholesterol: 145 mg/dL (ref 0–200)
HDL: 54.6 mg/dL (ref >39.00)
LDL Cholesterol: 58 mg/dL (ref 0–99)
NonHDL: 90.27
Total CHOL/HDL Ratio: 3
Triglycerides: 161 mg/dL — ABNORMAL HIGH (ref 0.0–149.0)
VLDL: 32.2 mg/dL (ref 0.0–40.0)

## 2022-01-11 LAB — TSH: TSH: 3.31 u[IU]/mL (ref 0.35–5.50)

## 2022-01-18 MED ORDER — FLUCONAZOLE 150 MG PO TABS: 150.0000 mg | ORAL_TABLET | Freq: Once | ORAL | 0 refills | Status: AC

## 2022-04-18 ENCOUNTER — Encounter: Payer: Self-pay | Admitting: Family Medicine

## 2022-04-18 ENCOUNTER — Telehealth: Payer: Self-pay | Admitting: Family Medicine

## 2022-04-18 ENCOUNTER — Ambulatory Visit: Payer: BC Managed Care – PPO | Admitting: Family Medicine

## 2022-04-18 VITALS — BP 124/76 | HR 60 | Temp 97.6°F | Resp 18 | Ht 66.0 in | Wt 172.2 lb

## 2022-04-18 DIAGNOSIS — N12 Tubulo-interstitial nephritis, not specified as acute or chronic: Secondary | ICD-10-CM

## 2022-04-18 DIAGNOSIS — R319 Hematuria, unspecified: Secondary | ICD-10-CM

## 2022-04-18 DIAGNOSIS — R300 Dysuria: Secondary | ICD-10-CM | POA: Diagnosis not present

## 2022-04-18 DIAGNOSIS — E119 Type 2 diabetes mellitus without complications: Secondary | ICD-10-CM

## 2022-04-18 LAB — POCT URINALYSIS DIP (MANUAL ENTRY)
Bilirubin, UA: NEGATIVE
Glucose, UA: >=1000 mg/dL — AB
Ketones, POC UA: NEGATIVE mg/dL
Nitrite, UA: NEGATIVE
Spec Grav, UA: 1.015 (ref 1.010–1.025)
Urobilinogen, UA: 0.2 E.U./dL
pH, UA: 6 (ref 5.0–8.0)

## 2022-04-18 MED ORDER — LEVOFLOXACIN 750 MG PO TABS: 750.0000 mg | ORAL_TABLET | Freq: Every day | ORAL | 0 refills | Status: DC

## 2022-04-18 MED ORDER — CEFTRIAXONE SODIUM 1 G IJ SOLR
1.0000 g | Freq: Once | INTRAMUSCULAR | Status: AC
  Administered 2022-04-18: 1 g via INTRAMUSCULAR

## 2022-04-18 NOTE — Patient Instructions (Signed)
Good to see you today but I am sorry you are sick We will be in touch with your urine culture and other results asap Shot of antibiotics and start on oral pill today (tomorrow also ok if you can't make it to the pharmacy today) Drink plenty of liquids and please let me know if not improving in 1-2 days- Sooner if worse.   I think we can continue the farxiga for now, but if you have more UTI infections we may need to stop it  Take care! Claudia Gregory

## 2022-04-18 NOTE — Telephone Encounter (Signed)
Okay for pt to bring a urine sample?

## 2022-04-18 NOTE — Progress Notes (Addendum)
[place] at [facility], [place], [place] [address] [phone]  Date:  04/18/2022   Name:  Claudia Gregory   DOB:  Aug 26, 1960   MRN:  [id number]  PCP:  Darreld Mclean, MD    Chief Complaint: Hematuria (Sxs started on Thursday: hematuria, chills, L flank pain.)   History of Present Illness:  Claudia Gregory is a 62 y.o. very pleasant female patient who presents with the following:  Patient seen today for concern of UTI symptoms Most recent visit with myself was in October for her physical Patient seen today with aid of a deaf interpreter  History of deafness, hypertension, GERD, hyperlipidemia, obesity, peripheral neuropathy, diabetes, fatty liver with elevation of LFT  Her diabetes has been well-controlled Renal function typically normal  Today is Monday- on Saturday she was not feeling well, she had chills She went to urinate and had dysuria She had to urinate frequently, will feel like she had to go again when she had just urinated Yesterday she saw some blood in her urine in the morning She had blood in her urine this am and had chills again She felt like she had  She has not noted a fever Gregory vomiting She feels ok now For some reason she only seems to have the sx in the am Gregory back pain She notes a bit of LLQ pain  We did start her on Farxiga a few months ago  Lab Results  Component Value Date   HGBA1C 6.8 (H) 04/18/2022     Patient Active Problem List   Diagnosis Date Noted   Controlled type 2 diabetes mellitus without complication, without long-term current use of insulin ([place]) 07/11/2019   Generalized anxiety disorder 12/28/2015   Elevated liver enzymes 12/28/2015   Plantar fasciitis of right foot 04/01/2015   Neuropathic pain of both feet 10/27/2014   Hx of adenomatous polyp of colon 08/08/2014   Deafness 08/04/2014   GERD (gastroesophageal reflux disease) 05/13/2014   Hyperlipidemia  02/05/2013   Hot flashes 11/24/2012   OBESITY 10/23/2008   Hypertension 10/23/2008   BIPOLAR DISORDER UNSPECIFIED 09/02/2008    Past Medical History:  Diagnosis Date   Anxiety    Bipolar 1 disorder ([place])    Carpal tunnel syndrome of left wrist    Chest pain, atypical    Deaf    Depression    Diabetes mellitus without complication ([place])    past hx- off meds    FATIGUE    Gallstones    GERD (gastroesophageal reflux disease)    Hx of adenomatous polyp of colon 08/08/2014   Hyperlipidemia    Hypertension    OBESITY    Pneumonia     Past Surgical History:  Procedure Laterality Date   CHOLECYSTECTOMY  2000   COLONOSCOPY  2016   5 mm adenoma   POLYPECTOMY     TUBAL LIGATION  1998   UPPER GASTROINTESTINAL ENDOSCOPY      Social History   Tobacco Use   Smoking status: Never   Smokeless tobacco: Never  Vaping Use   Vaping Use: Never used  Substance Use Topics   Alcohol use: Yes    Alcohol/week: 2.0 standard drinks of alcohol    Types: 2 Glasses of wine per week    Comment: rare glass of wine    Drug use: Gregory    Family History  Problem Relation Age of Onset   Brain cancer Mother  Hypertension Mother    Diabetes Mother    Diabetes Maternal Aunt    Heart attack Maternal Grandmother    Hypertension Maternal Grandmother    Colon cancer Neg Hx    Rectal cancer Neg Hx    Stomach cancer Neg Hx    Colon polyps Neg Hx    Esophageal cancer Neg Hx     Gregory Known Allergies  Medication list has been reviewed and updated.  Current Outpatient Medications on File Prior to Visit  Medication Sig Dispense Refill   clonazePAM (KLONOPIN) 0.5 MG tablet Take 0.5-1 tablets (0.25-0.5 mg total) by mouth 2 (two) times daily as needed for anxiety. 30 tablet 1   dapagliflozin propanediol (FARXIGA) 10 MG TABS tablet Take 1 tablet (10 mg total) by mouth daily before breakfast. 90 tablet 3   losartan-hydrochlorothiazide (HYZAAR) 50-12.5 MG tablet Take 1 tablet by mouth daily. 90 tablet 1    Melatonin 10 MG TABS Take by mouth. 2 tabs nightly     metFORMIN (GLUCOPHAGE) 500 MG tablet Take 1000 mg by mouth each morning and '500mg'$  by mouth each evening 270 tablet 3   rosuvastatin (CRESTOR) 20 MG tablet Take 1 tablet (20 mg total) by mouth daily. 90 tablet 3   sertraline (ZOLOFT) 100 MG tablet TAKE 1 AND 1/2 TABLETS(150 MG) BY MOUTH DAILY 135 tablet 3   Gregory current facility-administered medications on file prior to visit.    Review of Systems:  As per HPI- otherwise negative.   Physical Examination: Vitals:   04/18/22 1602  BP: 124/76  Pulse: 60  Resp: 18  Temp: 97.6 F (36.4 C)  SpO2: 97%   Vitals:   04/18/22 1602  Weight: 172 lb 3.2 oz (78.1 kg)  Height: '5\' 6"'$  (1.676 m)   Body mass index is 27.79 kg/m. Ideal Body Weight: Weight in (lb) to have BMI = 25: 154.6  GEN: Gregory acute distress.  Minimal overweight, looks well HEENT: Atraumatic, Normocephalic.  Ears and Nose: Gregory external deformity. CV: RRR, Gregory M/G/R. Gregory JVD. Gregory thrill. Gregory extra heart sounds. PULM: CTA B, Gregory wheezes, crackles, rhonchi. Gregory retractions. Gregory resp. distress. Gregory accessory muscle use. ABD: S, NT, ND, +BS. Gregory rebound. Gregory HSM.  Benign belly and Gregory CVA tenderness  EXTR: Gregory c/c/e PSYCH: Normally interactive. Conversant.  Pelvic exam- normal -Gregory uterine bleeding  Results for orders placed or performed in visit on 04/18/22  Urine Culture   Specimen: Urine  Result Value Ref Range   MICRO NUMBER: [id number]    SPECIMEN QUALITY: Adequate    Sample Source URINE    STATUS: FINAL    Result:      Mixed genital flora isolated. These superficial bacteria are not indicative of a urinary tract infection. Gregory further organism identification is warranted on this specimen. If clinically indicated, recollect clean-catch, mid-stream urine and transfer  immediately to Urine Culture Transport Tube.   CBC  Result Value Ref Range   WBC 9.4 4.0 - 10.5 K/uL   RBC 4.78 3.87 - 5.11 Mil/uL   Platelets 234.0 150.0 -  400.0 K/uL   Hemoglobin 14.3 12.0 - 15.0 g/dL   HCT 40.8 36.0 - 46.0 %   MCV 85.3 78.0 - 100.0 fl   MCHC 35.1 30.0 - 36.0 g/dL   RDW 13.5 11.5 - 76.1 %  Basic metabolic panel  Result Value Ref Range   Sodium 138 135 - 145 mEq/L   Potassium 3.9 3.5 - 5.1 mEq/L   Chloride 102 96 - 112  mEq/L   CO2 28 19 - 32 mEq/L   Glucose, Bld 135 (H) 70 - 99 mg/dL   BUN 15 6 - 23 mg/dL   Creatinine, Ser 0.59 0.40 - 1.20 mg/dL   GFR 98.83 >60.00 mL/min   Calcium 10.2 8.4 - 10.5 mg/dL  Hemoglobin A1c  Result Value Ref Range   Hgb A1c MFr Bld 6.8 (H) 4.6 - 6.5 %  Urine Microscopic Only  Result Value Ref Range   WBC, UA 11-20/hpf (A) 0-2/hpf   RBC / HPF 7-10/hpf (A) 0-2/hpf   Mucus, UA Presence of (A) None   Squamous Epithelial / HPF Rare(0-4/hpf) Rare(0-4/hpf)  POCT urinalysis dipstick  Result Value Ref Range   Color, UA yellow yellow   Clarity, UA cloudy (A) clear   Glucose, UA >=1,000 (A) negative mg/dL   Bilirubin, UA negative negative   Ketones, POC UA negative negative mg/dL   Spec Grav, UA 1.015 1.010 - 1.025   Blood, UA moderate (A) negative   pH, UA 6.0 5.0 - 8.0   Protein Ur, POC trace (A) negative mg/dL   Urobilinogen, UA 0.2 0.2 or 1.0 E.U./dL   Nitrite, UA Negative Negative   Leukocytes, UA Small (1+) (A) Negative     Assessment and Plan: Controlled type 2 diabetes mellitus without complication, without long-term current use of insulin ([facility]) - Plan: CBC, Basic metabolic panel, Hemoglobin A1c  Dysuria - Plan: Urine Culture, POCT urinalysis dipstick  Hematuria, unspecified type - Plan: CBC, Urine Culture, POCT urinalysis dipstick  Pyelonephritis - Plan: cefTRIAXone (ROCEPHIN) injection 1 g, levofloxacin (LEVAQUIN) 750 MG tablet, Urine Microscopic Only  Patient seen today with concern of likely urinary tract infection.  She has also had some chills and hematuria, will treat for possible pyelonephritis with Rocephin and start her on Levaquin.  Urine culture is pending She  does have some glucose in her urine, this is likely due to to use of SGLT 2 drug Discussed possible connection between this medication and her UTI.  She has been really happy with [facility] [place], does not want to stop it unless she actually has to.  Advised she can continue it for now, but if she has more UTI we may need to reconsider  She will monitor her symptoms carefully, follow-up if not improving within 1 to 2 days  Lab work is pending as above  Signed Lamar Blinks, MD  Addendum 1/16, received labs as below.  Message to patient  Results for orders placed or performed in visit on 04/18/22  Urine Culture   Specimen: Urine  Result Value Ref Range   MICRO NUMBER: [id number]    SPECIMEN QUALITY: Adequate    Sample Source URINE    STATUS: FINAL    Result:      Mixed genital flora isolated. These superficial bacteria are not indicative of a urinary tract infection. Gregory further organism identification is warranted on this specimen. If clinically indicated, recollect clean-catch, mid-stream urine and transfer  immediately to Urine Culture Transport Tube.   CBC  Result Value Ref Range   WBC 9.4 4.0 - 10.5 K/uL   RBC 4.78 3.87 - 5.11 Mil/uL   Platelets 234.0 150.0 - 400.0 K/uL   Hemoglobin 14.3 12.0 - 15.0 g/dL   HCT 40.8 36.0 - 46.0 %   MCV 85.3 78.0 - 100.0 fl   MCHC 35.1 30.0 - 36.0 g/dL   RDW 13.5 11.5 - 25.4 %  Basic metabolic panel  Result Value Ref Range   Sodium 138 135 -  145 mEq/L   Potassium 3.9 3.5 - 5.1 mEq/L   Chloride 102 96 - 112 mEq/L   CO2 28 19 - 32 mEq/L   Glucose, Bld 135 (H) 70 - 99 mg/dL   BUN 15 6 - 23 mg/dL   Creatinine, Ser 0.59 0.40 - 1.20 mg/dL   GFR 98.83 >60.00 mL/min   Calcium 10.2 8.4 - 10.5 mg/dL  Hemoglobin A1c  Result Value Ref Range   Hgb A1c MFr Bld 6.8 (H) 4.6 - 6.5 %  Urine Microscopic Only  Result Value Ref Range   WBC, UA 11-20/hpf (A) 0-2/hpf   RBC / HPF 7-10/hpf (A) 0-2/hpf   Mucus, UA Presence of (A) None   Squamous Epithelial / HPF  Rare(0-4/hpf) Rare(0-4/hpf)  POCT urinalysis dipstick  Result Value Ref Range   Color, UA yellow yellow   Clarity, UA cloudy (A) clear   Glucose, UA >=1,000 (A) negative mg/dL   Bilirubin, UA negative negative   Ketones, POC UA negative negative mg/dL   Spec Grav, UA 1.015 1.010 - 1.025   Blood, UA moderate (A) negative   pH, UA 6.0 5.0 - 8.0   Protein Ur, POC trace (A) negative mg/dL   Urobilinogen, UA 0.2 0.2 or 1.0 E.U./dL   Nitrite, UA Negative Negative   Leukocytes, UA Small (1+) (A) Negative   Addendum 1/18, received her urine culture which was negative Reached out to patient for follow-up

## 2022-04-18 NOTE — Telephone Encounter (Signed)
See duplicate telephone encounter.

## 2022-04-18 NOTE — Telephone Encounter (Signed)
Patient sent mychart message regarding chills, burning and bleeding when she pees. It's only in the morning. Patient said it happened Thursday, Friday and this morning. She is concerned since she is diabetic. Please call patient to advise

## 2022-04-19 ENCOUNTER — Encounter: Payer: Self-pay | Admitting: Family Medicine

## 2022-04-19 LAB — BASIC METABOLIC PANEL
BUN: 15 mg/dL (ref 6–23)
CO2: 28 mEq/L (ref 19–32)
Calcium: 10.2 mg/dL (ref 8.4–10.5)
Chloride: 102 mEq/L (ref 96–112)
Creatinine, Ser: 0.59 mg/dL (ref 0.40–1.20)
GFR: 98.83 mL/min (ref >60.00)
Glucose, Bld: 135 mg/dL — ABNORMAL HIGH (ref 70–99)
Potassium: 3.9 mEq/L (ref 3.5–5.1)
Sodium: 138 mEq/L (ref 135–145)

## 2022-04-19 LAB — URINALYSIS, MICROSCOPIC ONLY

## 2022-04-19 LAB — URINE CULTURE
MICRO NUMBER:: 14431584
SPECIMEN QUALITY:: ADEQUATE

## 2022-04-19 LAB — HEMOGLOBIN A1C: Hgb A1c MFr Bld: 6.8 % — ABNORMAL HIGH (ref 4.6–6.5)

## 2022-04-19 LAB — CBC
HCT: 40.8 % (ref 36.0–46.0)
Hemoglobin: 14.3 g/dL (ref 12.0–15.0)
MCHC: 35.1 g/dL (ref 30.0–36.0)
MCV: 85.3 fl (ref 78.0–100.0)
Platelets: 234 10*3/uL (ref 150.0–400.0)
RBC: 4.78 Mil/uL (ref 3.87–5.11)
RDW: 13.5 % (ref 11.5–15.5)
WBC: 9.4 10*3/uL (ref 4.0–10.5)

## 2022-04-21 ENCOUNTER — Encounter: Payer: Self-pay | Admitting: Family Medicine

## 2022-04-21 DIAGNOSIS — R310 Gross hematuria: Secondary | ICD-10-CM

## 2022-04-21 DIAGNOSIS — R109 Unspecified abdominal pain: Secondary | ICD-10-CM

## 2022-04-28 ENCOUNTER — Ambulatory Visit: Payer: BC Managed Care – PPO | Admitting: Urology

## 2022-04-28 ENCOUNTER — Encounter: Payer: Self-pay | Admitting: Urology

## 2022-04-28 ENCOUNTER — Ambulatory Visit (HOSPITAL_BASED_OUTPATIENT_CLINIC_OR_DEPARTMENT_OTHER)
Admission: RE | Admit: 2022-04-28 | Discharge: 2022-04-28 | Disposition: A | Discharge status: Home / Self Care | Payer: BC Managed Care – PPO | Source: Ambulatory Visit | Attending: Urology | Admitting: Urology

## 2022-04-28 VITALS — BP 143/84 | HR 62 | Ht 66.0 in | Wt 172.0 lb

## 2022-04-28 DIAGNOSIS — N281 Cyst of kidney, acquired: Secondary | ICD-10-CM | POA: Diagnosis not present

## 2022-04-28 DIAGNOSIS — R310 Gross hematuria: Secondary | ICD-10-CM | POA: Insufficient documentation

## 2022-04-28 DIAGNOSIS — R300 Dysuria: Secondary | ICD-10-CM | POA: Insufficient documentation

## 2022-04-28 DIAGNOSIS — N289 Disorder of kidney and ureter, unspecified: Secondary | ICD-10-CM | POA: Diagnosis not present

## 2022-04-28 LAB — URINALYSIS
Bilirubin, UA: NEGATIVE
Glucose, UA: 250 mg/dL — AB
Ketones, POC UA: NEGATIVE mg/dL
Leukocytes, UA: NEGATIVE
Nitrite, UA: NEGATIVE
Protein Ur, POC: NEGATIVE mg/dL
Spec Grav, UA: 1.02 (ref 1.010–1.025)
Urobilinogen, UA: 0.2 E.U./dL
pH, UA: 5.5 (ref 5.0–8.0)

## 2022-04-28 LAB — BLADDER SCAN AMB NON-IMAGING: Scan Result: 0

## 2022-04-28 MED ORDER — IOHEXOL 300 MG/ML  SOLN: 125.0000 mL | Freq: Once | INTRAMUSCULAR | Status: DC | PRN

## 2022-04-28 NOTE — Progress Notes (Signed)
Assessment: 1. Gross hematuria   2. Dysuria     Plan: I personally reviewed the patient's chart including provider notes, and lab results. Today I had a discussion with the patient regarding the findings of  gross and microscopic  hematuria including the implications and differential diagnoses associated with it.  I also discussed recommendations for further evaluation including the rationale for upper tract imaging and cystoscopy.  I discussed the nature of these procedures including potential risk and complications.  The patient expressed an understanding of these issues. Microscopic U/A today Schedule for CT hematuria protocol followed by cystoscopy Recommend AZO for dysuria  Chief Complaint:  Chief Complaint  Patient presents with   Hematuria    History of Present Illness:  Claudia Gregory is a 62 y.o. female who is seen in consultation from Copland, Gay Filler, MD for evaluation of gross and microscopic hematuria.  Approximately 3 weeks ago, she noted onset of dysuria.  She reported chills with voiding.  She also noted some blood on her toilet paper when she wiped.  She did not see blood in the urine stream.  She also noted increased frequency and urgency.  No flank pain or abdominal pain.  She was treated with antibiotics for suspected UTI.  Her symptoms slightly improved.  She continues to have some dysuria.  She also reports pain with intercourse.  No history of UTIs.  No history of kidney stones.  No history of tobacco use.  No recent imaging.  U/A 04/19/22:  7-10 RBC, 11-20 WBC Urine culture: mixed flora  She does report occasional urge incontinence.  An interpreter was used throughout the visit.  Past Medical History:  Past Medical History:  Diagnosis Date   Anxiety    Bipolar 1 disorder ([place])    Carpal tunnel syndrome of left wrist    Chest pain, atypical    Deaf    Depression    Diabetes mellitus without complication ([place])    past hx- off meds    FATIGUE     Gallstones    GERD (gastroesophageal reflux disease)    Hx of adenomatous polyp of colon 08/08/2014   Hyperlipidemia    Hypertension    OBESITY    Pneumonia     Past Surgical History:  Past Surgical History:  Procedure Laterality Date   CHOLECYSTECTOMY  2000   COLONOSCOPY  2016   5 mm adenoma   POLYPECTOMY     TUBAL LIGATION  1998   UPPER GASTROINTESTINAL ENDOSCOPY      Allergies:  No Known Allergies  Family History:  Family History  Problem Relation Age of Onset   Brain cancer Mother    Hypertension Mother    Diabetes Mother    Diabetes Maternal Aunt    Heart attack Maternal Grandmother    Hypertension Maternal Grandmother    Colon cancer Neg Hx    Rectal cancer Neg Hx    Stomach cancer Neg Hx    Colon polyps Neg Hx    Esophageal cancer Neg Hx     Social History:  Social History   Tobacco Use   Smoking status: Never   Smokeless tobacco: Never  Vaping Use   Vaping Use: Never used  Substance Use Topics   Alcohol use: Yes    Alcohol/week: 2.0 standard drinks of alcohol    Types: 2 Glasses of wine per week    Comment: rare glass of wine    Drug use: No    Review of symptoms:  Constitutional:  Negative for unexplained weight loss, night sweats, fever, chills ENT:  Negative for nose bleeds, sinus pain, painful swallowing CV:  Negative for chest pain, shortness of breath, exercise intolerance, palpitations, loss of consciousness Resp:  Negative for cough, wheezing, shortness of breath GI:  Negative for nausea, vomiting, diarrhea, bloody stools GU:  Positives noted in HPI; otherwise negative Neuro:  Negative for seizures, poor balance, limb weakness, slurred speech Psych:  Negative for lack of energy, depression, anxiety Endocrine:  Negative for polydipsia, polyuria, symptoms of hypoglycemia (dizziness, hunger, sweating) Hematologic:  Negative for anemia, purpura, petechia, prolonged or excessive bleeding, use of anticoagulants  Allergic:  Negative for  difficulty breathing or choking as a result of exposure to anything; no shellfish allergy; no allergic response (rash/itch) to materials, foods  Physical exam: BP (!) 143/84   Pulse 62   Ht '5\' 6"'$  (1.676 m)   Wt 172 lb (78 kg)   LMP 10/06/2015 (Approximate)   BMI 27.76 kg/m  GENERAL APPEARANCE:  Well appearing, well developed, well nourished, NAD HEENT: Atraumatic, Normocephalic, oropharynx clear. NECK: Supple without lymphadenopathy or thyromegaly. LUNGS: Clear to auscultation bilaterally. HEART: Regular Rate and Rhythm without murmurs, gallops, or rubs. ABDOMEN: Soft, non-tender, No Masses. EXTREMITIES: Moves all extremities well.  Without clubbing, cyanosis, or edema. NEUROLOGIC:  Alert and oriented x 3, normal gait, CN II-XII grossly intact.  MENTAL STATUS:  Appropriate. BACK:  Non-tender to palpation.  No CVAT SKIN:  Warm, dry and intact.    Results: U/A dipstick: 2+ glucose, trace blood  PVR = 0 ml

## 2022-04-29 LAB — URINALYSIS, MICROSCOPIC ONLY
Bacteria, UA: NONE SEEN
Casts: NONE SEEN /lpf
RBC, Urine: NONE SEEN /hpf (ref 0–2)

## 2022-05-04 ENCOUNTER — Other Ambulatory Visit: Payer: BC Managed Care – PPO | Admitting: Urology

## 2022-05-04 NOTE — Progress Notes (Deleted)
Assessment: 1. Gross hematuria   2. Dysuria   3. Bilateral deafness     Plan: I personally viewed the CT study from 04/29/2022 with results as noted below.  Results discussed with the patient today. Cipro x 1 following cystoscopy Recommend AZO for dysuria  Chief Complaint:  No chief complaint on file.   History of Present Illness:  Claudia Gregory is a 62 y.o. female who is seen for further evaluation of gross and microscopic hematuria.  Approximately 3 weeks prior to initial visit on 04/28/22, she noted onset of dysuria.  She reported chills with voiding.  She also noted some blood on her toilet paper when she wiped.  She did not see blood in the urine stream.  She had associated increased frequency and urgency.  No flank pain or abdominal pain.  She was treated with antibiotics for suspected UTI.  Her symptoms slightly improved.  She continued to have some dysuria.  She also reported pain with intercourse.  No history of UTIs.  No history of kidney stones.  No history of tobacco use.  No recent imaging.  U/A 04/19/22:  7-10 RBC, 11-20 WBC Urine culture: mixed flora  She has occasional urge incontinence. U/A from 04/28/22:  0 RBCs, 0-5 WBCs CT abdomen and pelvis with and without contrast from 04/29/2022 showed no renal or ureteral calculi, a Bosniak 1 left renal cyst, no obstruction or filling defects.  She presents today for further evaluation with cystoscopy.   An interpreter was used throughout the visit.  Portions of the above documentation were copied from a prior visit for review purposes only.   Past Medical History:  Past Medical History:  Diagnosis Date   Anxiety    Bipolar 1 disorder ([place])    Carpal tunnel syndrome of left wrist    Chest pain, atypical    Deaf    Depression    Diabetes mellitus without complication ([place])    past hx- off meds    FATIGUE    Gallstones    GERD (gastroesophageal reflux disease)    Hx of adenomatous polyp of colon 08/08/2014    Hyperlipidemia    Hypertension    OBESITY    Pneumonia     Past Surgical History:  Past Surgical History:  Procedure Laterality Date   CHOLECYSTECTOMY  2000   COLONOSCOPY  2016   5 mm adenoma   POLYPECTOMY     TUBAL LIGATION  1998   UPPER GASTROINTESTINAL ENDOSCOPY      Allergies:  No Known Allergies  Family History:  Family History  Problem Relation Age of Onset   Brain cancer Mother    Hypertension Mother    Diabetes Mother    Diabetes Maternal Aunt    Heart attack Maternal Grandmother    Hypertension Maternal Grandmother    Colon cancer Neg Hx    Rectal cancer Neg Hx    Stomach cancer Neg Hx    Colon polyps Neg Hx    Esophageal cancer Neg Hx     Social History:  Social History   Tobacco Use   Smoking status: Never   Smokeless tobacco: Never  Vaping Use   Vaping Use: Never used  Substance Use Topics   Alcohol use: Yes    Alcohol/week: 2.0 standard drinks of alcohol    Types: 2 Glasses of wine per week    Comment: rare glass of wine    Drug use: No    ROS: Constitutional:  Negative for fever, chills, weight  loss CV: Negative for chest pain, previous MI, hypertension Respiratory:  Negative for shortness of breath, wheezing, sleep apnea, frequent cough GI:  Negative for nausea, vomiting, bloody stool, GERD  Physical exam: LMP 10/06/2015 (Approximate)  GENERAL APPEARANCE:  Well appearing, well developed, well nourished, NAD HEENT:  Atraumatic, normocephalic, oropharynx clear NECK:  Supple without lymphadenopathy or thyromegaly ABDOMEN:  Soft, non-tender, no masses EXTREMITIES:  Moves all extremities well, without clubbing, cyanosis, or edema NEUROLOGIC:  Alert and oriented x 3, normal gait, CN II-XII grossly intact MENTAL STATUS:  appropriate BACK:  Non-tender to palpation, No CVAT SKIN:  Warm, dry, and intact  Results: U/A dipstick:    Procedure: Flexible cystoscopy  Pre-Operative Diagnosis:  {cysto diagnosis:26394}  Post-Operative  Diagnosis: {cysto diagnosis:26394}  Anesthesia: local with lidocaine gel  Surgical Narrative:  After appropriate informed consent was obtained, the patient was prepped and draped in the usual sterile fashion in the supine position. She was correctly identified and the proper procedure delineated prior to proceeding. Sterile lidocaine gel was instilled in the urethra.  The flexible cystoscope was introduced without difficulty.  Findings:  Urethra: {anterior urethral findings:26395}  Bladder: {bladder findings:26397}  Ureteral orifices: {Normal/Abnormal Appearance:21344::"normal"}  Additional findings: ***  A bladder wash {WAS/WAS NOT:9025029605::"was not"} obtained for cytology.  A Marshall test {WAS/WAS NOT:9025029605::"was not"} performed. Stress urinary incontinence {WAS/WAS NOT:9025029605::"was not"} present with cough valsalva. Volume was {Severity brief:12472}  Pelvic exam {WAS/WAS NOT:9025029605::"was not"} positive for pelvic prolapse.  She tolerated the procedure well.  A chaperone was present throughout the procedure.

## 2022-05-10 ENCOUNTER — Encounter: Payer: Self-pay | Admitting: Urology

## 2022-05-10 ENCOUNTER — Ambulatory Visit: Payer: BC Managed Care – PPO | Admitting: Urology

## 2022-05-10 VITALS — BP 159/88 | HR 56 | Ht 66.0 in | Wt 171.0 lb

## 2022-05-10 DIAGNOSIS — R3129 Other microscopic hematuria: Secondary | ICD-10-CM

## 2022-05-10 DIAGNOSIS — R310 Gross hematuria: Secondary | ICD-10-CM

## 2022-05-10 DIAGNOSIS — R300 Dysuria: Secondary | ICD-10-CM

## 2022-05-10 LAB — URINALYSIS
Blood, UA: NEGATIVE
Glucose, UA: 100 mg/dL — AB
Nitrite, UA: NEGATIVE
Protein Ur, POC: NEGATIVE mg/dL
Spec Grav, UA: >=1.03 — AB (ref 1.010–1.025)
Urobilinogen, UA: 1 E.U./dL
pH, UA: 5.5 (ref 5.0–8.0)

## 2022-05-10 MED ORDER — CIPROFLOXACIN HCL 500 MG PO TABS
500.0000 mg | ORAL_TABLET | Freq: Once | ORAL | Status: AC
  Administered 2022-05-10: 500 mg via ORAL

## 2022-05-10 NOTE — Progress Notes (Signed)
Assessment: 1. Gross hematuria   2. Dysuria     Plan: I personally viewed the CT study from 04/29/2022 with results as noted below.  Results discussed with the patient today. Cipro x 1 following cystoscopy Urine cytology sent today Bladder diet sheet given. Return to office in 6 weeks   Today, I personally spent 15 minutes in direct face to face time with the patient, of which greater than 50% of the time was spent in patient education and counseling as described above in addition to the cystoscopy procedure.  Chief Complaint:  Chief Complaint  Patient presents with   Cysto    History of Present Illness:  Claudia Gregory is a 62 y.o. female who is seen for further evaluation of gross and microscopic hematuria.  Approximately 3 weeks prior to initial visit on 04/28/22, she noted onset of dysuria.  She reported chills with voiding.  She also noted some blood on her toilet paper when she wiped.  She did not see blood in the urine stream.  She had associated increased frequency and urgency.  No flank pain or abdominal pain.  She was treated with antibiotics for suspected UTI.  Her symptoms slightly improved.  She continued to have some dysuria.  She also reported pain with intercourse.  No history of UTIs.  No history of kidney stones.  No history of tobacco use.  No recent imaging.  U/A 04/19/22:  7-10 RBC, 11-20 WBC Urine culture: mixed flora  She has occasional urge incontinence. U/A from 04/28/22:  0 RBCs, 0-5 WBCs CT abdomen and pelvis with and without contrast from 04/29/2022 showed no renal or ureteral calculi, a Bosniak 1 left renal cyst, no obstruction or filling defects.  She presents today for further evaluation with cystoscopy.  No further gross hematuria.  Her dysuria has improved.  She is taking AZO prn.   An interpreter was used throughout the visit.  Portions of the above documentation were copied from a prior visit for review purposes only.   Past Medical History:   Past Medical History:  Diagnosis Date   Anxiety    Bipolar 1 disorder ([place])    Carpal tunnel syndrome of left wrist    Chest pain, atypical    Deaf    Depression    Diabetes mellitus without complication ([place])    past hx- off meds    FATIGUE    Gallstones    GERD (gastroesophageal reflux disease)    Hx of adenomatous polyp of colon 08/08/2014   Hyperlipidemia    Hypertension    OBESITY    Pneumonia     Past Surgical History:  Past Surgical History:  Procedure Laterality Date   CHOLECYSTECTOMY  2000   COLONOSCOPY  2016   5 mm adenoma   POLYPECTOMY     TUBAL LIGATION  1998   UPPER GASTROINTESTINAL ENDOSCOPY      Allergies:  No Known Allergies  Family History:  Family History  Problem Relation Age of Onset   Brain cancer Mother    Hypertension Mother    Diabetes Mother    Diabetes Maternal Aunt    Heart attack Maternal Grandmother    Hypertension Maternal Grandmother    Colon cancer Neg Hx    Rectal cancer Neg Hx    Stomach cancer Neg Hx    Colon polyps Neg Hx    Esophageal cancer Neg Hx     Social History:  Social History   Tobacco Use   Smoking status: Never  Smokeless tobacco: Never  Vaping Use   Vaping Use: Never used  Substance Use Topics   Alcohol use: Yes    Alcohol/week: 2.0 standard drinks of alcohol    Types: 2 Glasses of wine per week    Comment: rare glass of wine    Drug use: No    ROS: Constitutional:  Negative for fever, chills, weight loss CV: Negative for chest pain, previous MI, hypertension Respiratory:  Negative for shortness of breath, wheezing, sleep apnea, frequent cough GI:  Negative for nausea, vomiting, bloody stool, GERD  Physical exam: BP (!) 159/88   Pulse (!) 56   Ht '5\' 6"'$  (1.676 m)   Wt 171 lb (77.6 kg)   LMP 10/06/2015 (Approximate)   BMI 27.60 kg/m  GENERAL APPEARANCE:  Well appearing, well developed, well nourished, NAD HEENT:  Atraumatic, normocephalic, oropharynx clear NECK:  Supple without  lymphadenopathy or thyromegaly ABDOMEN:  Soft, non-tender, no masses EXTREMITIES:  Moves all extremities well, without clubbing, cyanosis, or edema NEUROLOGIC:  Alert and oriented x 3, normal gait, CN II-XII grossly intact MENTAL STATUS:  appropriate BACK:  Non-tender to palpation, No CVAT SKIN:  Warm, dry, and intact  Results: U/A dipstick: trace glucose, 1+ bilirubin, trace ketones, trace LE   Procedure: Flexible cystoscopy  Pre-Operative Diagnosis:  Gross hematuria  Post-Operative Diagnosis: Gross hematuria  Anesthesia: local with lidocaine gel  Surgical Narrative:  After appropriate informed consent was obtained, the patient was prepped and draped in the usual sterile fashion in the supine position. She was correctly identified and the proper procedure delineated prior to proceeding. Sterile lidocaine gel was instilled in the urethra.  The flexible cystoscope was introduced without difficulty.  Findings:  Urethra: Normal  Bladder: Normal  Ureteral orifices: normal  Additional findings: none  A bladder wash was obtained for cytology.  Stress urinary incontinence was not present with cough valsalva. Pelvic exam was positive for pelvic prolapse with grade 2 cystocele.  She tolerated the procedure well.  A chaperone was present throughout the procedure.

## 2022-05-16 ENCOUNTER — Encounter: Payer: Self-pay | Admitting: Urology

## 2022-05-20 ENCOUNTER — Other Ambulatory Visit: Payer: BC Managed Care – PPO | Admitting: Urology

## 2022-06-21 ENCOUNTER — Ambulatory Visit: Payer: BC Managed Care – PPO | Admitting: Urology

## 2022-07-18 ENCOUNTER — Ambulatory Visit: Payer: BC Managed Care – PPO | Admitting: Family Medicine

## 2022-07-18 ENCOUNTER — Encounter: Payer: Self-pay | Admitting: Family Medicine

## 2022-07-18 VITALS — BP 146/82 | HR 59 | Temp 97.4°F | Ht 66.0 in | Wt 174.0 lb

## 2022-07-18 DIAGNOSIS — H60502 Unspecified acute noninfective otitis externa, left ear: Secondary | ICD-10-CM | POA: Diagnosis not present

## 2022-07-18 MED ORDER — CIPROFLOXACIN-DEXAMETHASONE 0.3-0.1 % OT SUSP: 4.0000 [drp] | Freq: Two times a day (BID) | OTIC | 0 refills | Status: AC

## 2022-07-18 MED ORDER — AMOXICILLIN-POT CLAVULANATE 875-125 MG PO TABS: 1.0000 | ORAL_TABLET | Freq: Two times a day (BID) | ORAL | 0 refills | Status: AC

## 2022-07-18 NOTE — Progress Notes (Signed)
Acute Office Visit  Subjective:     Patient ID: Claudia Gregory, female    DOB: 1960-11-17, 62 y.o.   MRN: [id number]  Chief Complaint  Patient presents with   Ear Problem    HPI Patient is in today for left ear pain. ASL interpreter used.    Ear Pain: Patient presents with left ear pain.  Symptoms include  pain 8/10, burning/throbbing . Symptoms began 6 days ago and are gradually worsening since that time. Patient denies ear drainage, external ear swelling/redness, chills, facial pain  , fever, headache  , nasal congestion, productive cough, sneezing, and sore throat. Ear history: a few previous ear infections.  She tried Advil, Aleve, Tylenol, Peroxide with no improvement. Reports she has not been able to wear her hearing aid due to the pain.      ROS All review of systems negative except what is listed in the HPI      Objective:    BP (!) 146/82   Pulse (!) 59   Temp (!) 97.4 F (36.3 C) (Oral)   Ht 5\' 6"  (1.676 m)   Wt 174 lb (78.9 kg)   LMP 10/06/2015 (Approximate)   SpO2 95%   BMI 28.08 kg/m    Physical Exam Vitals reviewed.  Constitutional:      Appearance: Normal appearance.  HENT:     Right Ear: Tympanic membrane, ear canal and external ear normal. There is no impacted cerumen.     Left Ear: External ear normal. There is no impacted cerumen.     Ears:     Comments: L ear canal with erythema and inflammation, unable to visualize TM due to inflammation/pain on exam Musculoskeletal:     Cervical back: Normal range of motion and neck supple. No tenderness.  Lymphadenopathy:     Cervical: No cervical adenopathy.  Neurological:     General: No focal deficit present.     Mental Status: She is alert and oriented to person, place, and time. Mental status is at baseline.  Psychiatric:        Mood and Affect: Mood normal.        Behavior: Behavior normal.        Thought Content: Thought content normal.        Judgment: Judgment normal.     No results  found for any visits on 07/18/22.      Assessment & Plan:   Problem List Items Addressed This Visit   None Visit Diagnoses     Acute otitis externa of left ear, unspecified type    -  Primary   Relevant Medications   amoxicillin-clavulanate (AUGMENTIN) 875-125 MG tablet   ciprofloxacin-dexamethasone (CIPRODEX) OTIC suspension     Given severity of redness/swelling/pain, starting oral antibiotics and ear drops. If not starting to notice some improvement by the end of the week, please let us know. Take entire week-long course of treatment.  Patient aware of signs/symptoms requiring further/urgent evaluation. Work note provided, asking that she not wear hearing aids until infection fully resolved.      Meds ordered this encounter  Medications   amoxicillin-clavulanate (AUGMENTIN) 875-125 MG tablet    Sig: Take 1 tablet by mouth 2 (two) times daily for 7 days.    Dispense:  14 tablet    Refill:  0    Order Specific Question:   Supervising Provider    Answer:   Danise Edge A [[id number]]   ciprofloxacin-dexamethasone (CIPRODEX) OTIC suspension    Sig: Place 4  drops into the left ear 2 (two) times daily for 7 days.    Dispense:  2.8 mL    Refill:  0    Order Specific Question:   Supervising Provider    Answer:   Danise Edge A [[id number]]    Return if symptoms worsen or fail to improve.  Clayborne Dana, NP

## 2022-07-18 NOTE — Patient Instructions (Signed)
Given severity of redness/swelling/pain, starting oral antibiotics and ear drops. If not starting to notice some improvement by the end of the week, please let us know. Take entire week-long course of treatment.   Please contact office for follow-up if symptoms do not improve or worsen. Seek emergency care if symptoms become severe.

## 2022-08-07 ENCOUNTER — Other Ambulatory Visit: Payer: Self-pay | Admitting: Family Medicine

## 2022-09-24 NOTE — Progress Notes (Addendum)
[facility] [address], [address] [place], [place] [address] [phone]  Date:  09/29/2022   Name:  Claudia Gregory   DOB:  Nov 28, 1960   MRN:  [id number]  PCP:  Pearline Cables, MD    Chief Complaint: Follow-up (Concerns/ questions: pt c/o dizziness but says she feels better once she eats)   History of Present Illness:  Claudia Gregory is a 62 y.o. very pleasant female patient who presents with the following:  Patient seen today for periodic follow-up Most recent visit with myself was in January of this year History of deafness, hypertension, GERD, hyperlipidemia, obesity, peripheral neuropathy, diabetes, fatty liver with elevation of LFT  Seen today accompanied by her husband and with aid of ASL interpreter Her diabetes has been well-controlled Renal function typically normal  Mammogram-May need to be updated Eye exam due this month Can update A1c Colon cancer screening up-to-date, Pap smear up-to-date  Blood work in January-BMP, CBC, A1c  She has noted some sx of hypoglycemia for about 3-4 months, will come and go unexpectedly-she will have occasional episodes of feeling very hot, clammy and sweaty, will feel hungry and thirsty and like she needs to eat something.  She will eat something sweet and feel better  Wt Readings from Last 3 Encounters:  09/29/22 174 lb 9.6 oz (79.2 kg)  07/18/22 174 lb (78.9 kg)  05/10/22 171 lb (77.6 kg)     Lab Results  Component Value Date   HGBA1C 6.8 (H) 04/18/2022     Patient Active Problem List   Diagnosis Date Noted   Gross hematuria 04/28/2022   Dysuria 04/28/2022   Controlled type 2 diabetes mellitus without complication, without long-term current use of insulin (HCC) 07/11/2019   Generalized anxiety disorder 12/28/2015   Elevated liver enzymes 12/28/2015   Plantar fasciitis of right foot 04/01/2015   Neuropathic pain of both feet 10/27/2014   Hx of adenomatous polyp  of colon 08/08/2014   Deafness 08/04/2014   GERD (gastroesophageal reflux disease) 05/13/2014   Hyperlipidemia 02/05/2013   Hot flashes 11/24/2012   OBESITY 10/23/2008   Hypertension 10/23/2008   BIPOLAR DISORDER UNSPECIFIED 09/02/2008    Past Medical History:  Diagnosis Date   Anxiety    Bipolar 1 disorder (HCC)    Carpal tunnel syndrome of left wrist    Chest pain, atypical    Deaf    Depression    Diabetes mellitus without complication (HCC)    past hx- off meds    FATIGUE    Gallstones    GERD (gastroesophageal reflux disease)    Hx of adenomatous polyp of colon 08/08/2014   Hyperlipidemia    Hypertension    OBESITY    Pneumonia     Past Surgical History:  Procedure Laterality Date   CHOLECYSTECTOMY  2000   COLONOSCOPY  2016   5 mm adenoma   POLYPECTOMY     TUBAL LIGATION  1998   UPPER GASTROINTESTINAL ENDOSCOPY      Social History   Tobacco Use   Smoking status: Never   Smokeless tobacco: Never  Vaping Use   Vaping Use: Never used  Substance Use Topics   Alcohol use: Yes    Alcohol/week: 2.0 standard drinks of alcohol    Types: 2 Glasses of wine per week    Comment: rare glass of wine    Drug use: No    Family History  Problem Relation Age of Onset  Brain cancer Mother    Hypertension Mother    Diabetes Mother    Diabetes Maternal Aunt    Heart attack Maternal Grandmother    Hypertension Maternal Grandmother    Colon cancer Neg Hx    Rectal cancer Neg Hx    Stomach cancer Neg Hx    Colon polyps Neg Hx    Esophageal cancer Neg Hx     No Known Allergies  Medication list has been reviewed and updated.  Current Outpatient Medications on File Prior to Visit  Medication Sig Dispense Refill   clonazePAM (KLONOPIN) 0.5 MG tablet Take 0.5-1 tablets (0.25-0.5 mg total) by mouth 2 (two) times daily as needed for anxiety. 30 tablet 1   dapagliflozin propanediol (FARXIGA) 10 MG TABS tablet Take 1 tablet (10 mg total) by mouth daily before breakfast.  90 tablet 3   losartan-hydrochlorothiazide (HYZAAR) 50-12.5 MG tablet Take 1 tablet by mouth daily. 90 tablet 1   Melatonin 10 MG TABS Take by mouth. 2 tabs nightly     metFORMIN (GLUCOPHAGE) 500 MG tablet Take 1000 mg by mouth each morning and 500mg  by mouth each evening 270 tablet 3   rosuvastatin (CRESTOR) 20 MG tablet Take 1 tablet (20 mg total) by mouth daily. 90 tablet 3   sertraline (ZOLOFT) 100 MG tablet TAKE 1 AND 1/2 TABLETS(150 MG) BY MOUTH DAILY 135 tablet 3   No current facility-administered medications on file prior to visit.    Review of Systems:  As per HPI- otherwise negative.   Physical Examination: Vitals:   09/29/22 1421  BP: 118/80  Pulse: 63  Resp: 18  Temp: 97.6 F (36.4 C)  SpO2: 97%   Vitals:   09/29/22 1421  Weight: 174 lb 9.6 oz (79.2 kg)  Height: 5\' 6"  (1.676 m)   Body mass index is 28.18 kg/m. Ideal Body Weight: Weight in (lb) to have BMI = 25: 154.6  GEN: no acute distress.  Minimal overweight, looks well HEENT: Atraumatic, Normocephalic.  Ears and Nose: No external deformity. CV: RRR, No M/G/R. No JVD. No thrill. No extra heart sounds. PULM: CTA B, no wheezes, crackles, rhonchi. No retractions. No resp. distress. No accessory muscle use. ABD: S, NT, ND, +BS. No rebound. No HSM. EXTR: No c/c/e PSYCH: Normally interactive. Conversant.    Assessment and Plan: Controlled type 2 diabetes mellitus without complication, without long-term current use of insulin (HCC) - Plan: Comprehensive metabolic panel, Hemoglobin A1c  Essential hypertension - Plan: Comprehensive metabolic panel  Vitamin D deficiency - Plan: VITAMIN D 25 Hydroxy (Vit-D Deficiency, Fractures)  Hypoglycemia  Blood pressure well-controlled on current regimen Will plan further follow- up pending labs.  It was great to see you again today, assuming all is well please see me in about 6 months  From your symptoms that you described, I suspect you are having episodes of low  blood sugar.  It would be okay for [place] to let your blood sugar go up a little bit, your last A1c was well within the desired range.  Lets have you decrease your metformin to 500 mg twice a day.  We can also decrease your Marcelline Deist to 1/2 tablet or 5 mg.  Marcelline Deist does, and a 5 mg tablet, let me know when you are ready for a refill and I can send in the 5 mg strength  I think your eye exam may be due, be sure to do this once a year  With labs, remind patient mammogram appears to be due  Signed Abbe Amsterdam, MD  Addendum 6/28, received labs as below.  Message to patient  Results for orders placed or performed in visit on 09/29/22  Comprehensive metabolic panel  Result Value Ref Range   Sodium 138 135 - 145 mEq/L   Potassium 4.1 3.5 - 5.1 mEq/L   Chloride 98 96 - 112 mEq/L   CO2 30 19 - 32 mEq/L   Glucose, Bld 106 (H) 70 - 99 mg/dL   BUN 12 6 - 23 mg/dL   Creatinine, Ser 0.27 0.40 - 1.20 mg/dL   Total Bilirubin 0.5 0.2 - 1.2 mg/dL   Alkaline Phosphatase 70 39 - 117 U/L   AST 23 0 - 37 U/L   ALT 29 0 - 35 U/L   Total Protein 7.7 6.0 - 8.3 g/dL   Albumin 4.9 3.5 - 5.2 g/dL   GFR 25.36 >64.40 mL/min   Calcium 11.0 (H) 8.4 - 10.5 mg/dL  Hemoglobin H4V  Result Value Ref Range   Hgb A1c MFr Bld 7.1 (H) 4.6 - 6.5 %  VITAMIN D 25 Hydroxy (Vit-D Deficiency, Fractures)  Result Value Ref Range   VITD 25.17 (L) 30.00 - 100.00 ng/mL

## 2022-09-24 NOTE — Patient Instructions (Incomplete)
It was great to see you again today, assuming all is well please see me in about 6 months 

## 2022-09-29 ENCOUNTER — Encounter (HOSPITAL_BASED_OUTPATIENT_CLINIC_OR_DEPARTMENT_OTHER): Payer: Self-pay

## 2022-09-29 ENCOUNTER — Ambulatory Visit (INDEPENDENT_AMBULATORY_CARE_PROVIDER_SITE_OTHER): Payer: BC Managed Care – PPO | Admitting: Family Medicine

## 2022-09-29 ENCOUNTER — Ambulatory Visit (HOSPITAL_BASED_OUTPATIENT_CLINIC_OR_DEPARTMENT_OTHER)
Admission: RE | Admit: 2022-09-29 | Discharge: 2022-09-29 | Disposition: A | Discharge status: Home / Self Care | Payer: BC Managed Care – PPO | Source: Ambulatory Visit | Attending: Family Medicine | Admitting: Family Medicine

## 2022-09-29 ENCOUNTER — Other Ambulatory Visit (HOSPITAL_BASED_OUTPATIENT_CLINIC_OR_DEPARTMENT_OTHER): Payer: Self-pay | Admitting: Family Medicine

## 2022-09-29 VITALS — BP 118/80 | HR 63 | Temp 97.6°F | Resp 18 | Ht 66.0 in | Wt 174.6 lb

## 2022-09-29 DIAGNOSIS — Z7984 Long term (current) use of oral hypoglycemic drugs: Secondary | ICD-10-CM

## 2022-09-29 DIAGNOSIS — E559 Vitamin D deficiency, unspecified: Secondary | ICD-10-CM

## 2022-09-29 DIAGNOSIS — Z1231 Encounter for screening mammogram for malignant neoplasm of breast: Secondary | ICD-10-CM

## 2022-09-29 DIAGNOSIS — E119 Type 2 diabetes mellitus without complications: Secondary | ICD-10-CM | POA: Diagnosis not present

## 2022-09-29 DIAGNOSIS — I10 Essential (primary) hypertension: Secondary | ICD-10-CM | POA: Diagnosis not present

## 2022-09-29 DIAGNOSIS — E162 Hypoglycemia, unspecified: Secondary | ICD-10-CM | POA: Diagnosis not present

## 2022-09-30 ENCOUNTER — Encounter: Payer: Self-pay | Admitting: Family Medicine

## 2022-09-30 LAB — COMPREHENSIVE METABOLIC PANEL
ALT: 29 U/L (ref 0–35)
AST: 23 U/L (ref 0–37)
Albumin: 4.9 g/dL (ref 3.5–5.2)
Alkaline Phosphatase: 70 U/L (ref 39–117)
BUN: 12 mg/dL (ref 6–23)
CO2: 30 mEq/L (ref 19–32)
Calcium: 11 mg/dL — ABNORMAL HIGH (ref 8.4–10.5)
Chloride: 98 mEq/L (ref 96–112)
Creatinine, Ser: 0.59 mg/dL (ref 0.40–1.20)
GFR: 98.52 mL/min (ref >60.00)
Glucose, Bld: 106 mg/dL — ABNORMAL HIGH (ref 70–99)
Potassium: 4.1 mEq/L (ref 3.5–5.1)
Sodium: 138 mEq/L (ref 135–145)
Total Bilirubin: 0.5 mg/dL (ref 0.2–1.2)
Total Protein: 7.7 g/dL (ref 6.0–8.3)

## 2022-09-30 LAB — HEMOGLOBIN A1C: Hgb A1c MFr Bld: 7.1 % — ABNORMAL HIGH (ref 4.6–6.5)

## 2022-09-30 LAB — VITAMIN D 25 HYDROXY (VIT D DEFICIENCY, FRACTURES): VITD: 25.17 ng/mL — ABNORMAL LOW (ref 30.00–100.00)

## 2022-09-30 NOTE — Addendum Note (Signed)
Addended by: Pearline Cables on: 09/30/2022 05:37 PM   Modules accepted: Orders

## 2022-10-03 ENCOUNTER — Ambulatory Visit: Payer: BC Managed Care – PPO | Admitting: Family Medicine

## 2022-11-06 ENCOUNTER — Encounter: Payer: Self-pay | Admitting: Family Medicine

## 2022-11-06 NOTE — Progress Notes (Deleted)
[facility] [address] [place], [place] [address] [phone]  Date:  11/09/2022   Name:  Claudia Gregory   DOB:  November 25, 1960   MRN:  [id number]  PCP:  Pearline Cables, MD    Chief Complaint: No chief complaint on file.   History of Present Illness:  Claudia Gregory is a 62 y.o. very pleasant female patient who presents with the following:  Patient seen today for follow-up of diabetes Most recent visit with myself was in June History of deafness, hypertension, GERD, hyperlipidemia, obesity, peripheral neuropathy, diabetes, fatty liver with elevation of LFT   Her diabetes has typically been well-controlled.  In June she was having some symptoms of hypoglycemia, we planned to try cutting back on metformin and also reducing her Marcelline Deist from 5 to 2.5 mg Patient Active Problem List   Diagnosis Date Noted   Gross hematuria 04/28/2022   Dysuria 04/28/2022   Controlled type 2 diabetes mellitus without complication, without long-term current use of insulin (HCC) 07/11/2019   Generalized anxiety disorder 12/28/2015   Elevated liver enzymes 12/28/2015   Plantar fasciitis of right foot 04/01/2015   Neuropathic pain of both feet 10/27/2014   Hx of adenomatous polyp of colon 08/08/2014   Deafness 08/04/2014   GERD (gastroesophageal reflux disease) 05/13/2014   Hyperlipidemia 02/05/2013   Hot flashes 11/24/2012   OBESITY 10/23/2008   Hypertension 10/23/2008   BIPOLAR DISORDER UNSPECIFIED 09/02/2008    Past Medical History:  Diagnosis Date   Anxiety    Bipolar 1 disorder (HCC)    Carpal tunnel syndrome of left wrist    Chest pain, atypical    Deaf    Depression    Diabetes mellitus without complication (HCC)    past hx- off meds    FATIGUE    Gallstones    GERD (gastroesophageal reflux disease)    Hx of adenomatous polyp of colon 08/08/2014   Hyperlipidemia    Hypertension    OBESITY    Pneumonia     Past  Surgical History:  Procedure Laterality Date   CHOLECYSTECTOMY  2000   COLONOSCOPY  2016   5 mm adenoma   POLYPECTOMY     TUBAL LIGATION  1998   UPPER GASTROINTESTINAL ENDOSCOPY      Social History   Tobacco Use   Smoking status: Never   Smokeless tobacco: Never  Vaping Use   Vaping status: Never Used  Substance Use Topics   Alcohol use: Yes    Alcohol/week: 2.0 standard drinks of alcohol    Types: 2 Glasses of wine per week    Comment: rare glass of wine    Drug use: No    Family History  Problem Relation Age of Onset   Brain cancer Mother    Hypertension Mother    Diabetes Mother    Diabetes Maternal Aunt    Heart attack Maternal Grandmother    Hypertension Maternal Grandmother    Colon cancer Neg Hx    Rectal cancer Neg Hx    Stomach cancer Neg Hx    Colon polyps Neg Hx    Esophageal cancer Neg Hx     No Known Allergies  Medication list has been reviewed and updated.  Current Outpatient Medications on File Prior to Visit  Medication Sig Dispense Refill   clonazePAM (KLONOPIN) 0.5 MG tablet Take 0.5-1 tablets (0.25-0.5 mg total) by mouth 2 (two) times daily as needed for anxiety. 30  tablet 1   dapagliflozin propanediol (FARXIGA) 10 MG TABS tablet Take 1 tablet (10 mg total) by mouth daily before breakfast. 90 tablet 3   losartan-hydrochlorothiazide (HYZAAR) 50-12.5 MG tablet Take 1 tablet by mouth daily. 90 tablet 1   Melatonin 10 MG TABS Take by mouth. 2 tabs nightly     metFORMIN (GLUCOPHAGE) 500 MG tablet Take 1000 mg by mouth each morning and 500mg  by mouth each evening 270 tablet 3   rosuvastatin (CRESTOR) 20 MG tablet Take 1 tablet (20 mg total) by mouth daily. 90 tablet 3   sertraline (ZOLOFT) 100 MG tablet TAKE 1 AND 1/2 TABLETS(150 MG) BY MOUTH DAILY 135 tablet 3   No current facility-administered medications on file prior to visit.    Review of Systems:  ***  Physical Examination: There were no vitals filed for this visit. There were no  vitals filed for this visit. There is no height or weight on file to calculate BMI. Ideal Body Weight:    ***  Assessment and Plan: ***  Signed Abbe Amsterdam, MD

## 2022-11-07 NOTE — Telephone Encounter (Signed)
Pt rescheduled for Sept.

## 2022-11-09 ENCOUNTER — Ambulatory Visit: Payer: BC Managed Care – PPO | Admitting: Family Medicine

## 2022-11-12 ENCOUNTER — Encounter: Payer: Self-pay | Admitting: Family Medicine

## 2022-11-14 ENCOUNTER — Encounter: Payer: Self-pay | Admitting: Family Medicine

## 2022-11-14 ENCOUNTER — Other Ambulatory Visit: Payer: Self-pay | Admitting: Family Medicine

## 2022-11-14 ENCOUNTER — Ambulatory Visit: Payer: BC Managed Care – PPO | Admitting: Family Medicine

## 2022-11-14 VITALS — BP 127/61 | HR 58 | Temp 97.7°F | Ht 66.0 in | Wt 176.0 lb

## 2022-11-14 DIAGNOSIS — B359 Dermatophytosis, unspecified: Secondary | ICD-10-CM | POA: Diagnosis not present

## 2022-11-14 DIAGNOSIS — E785 Hyperlipidemia, unspecified: Secondary | ICD-10-CM

## 2022-11-14 DIAGNOSIS — E119 Type 2 diabetes mellitus without complications: Secondary | ICD-10-CM

## 2022-11-14 DIAGNOSIS — H60501 Unspecified acute noninfective otitis externa, right ear: Secondary | ICD-10-CM

## 2022-11-14 DIAGNOSIS — F411 Generalized anxiety disorder: Secondary | ICD-10-CM

## 2022-11-14 MED ORDER — CLOTRIMAZOLE 1 % EX CREA: 1.0000 | TOPICAL_CREAM | Freq: Two times a day (BID) | CUTANEOUS | 0 refills | Status: DC

## 2022-11-14 MED ORDER — AMOXICILLIN-POT CLAVULANATE 875-125 MG PO TABS: 1.0000 | ORAL_TABLET | Freq: Two times a day (BID) | ORAL | 0 refills | Status: AC

## 2022-11-14 NOTE — Progress Notes (Signed)
Acute Office Visit  Subjective:     Patient ID: Claudia Gregory, female    DOB: 06/08/1960, 62 y.o.   MRN: [id number]  Chief Complaint  Patient presents with   Ear Pain    HPI Patient is in today for ear pain.   Discussed the use of AI scribe software for clinical note transcription with the patient, who gave verbal consent to proceed.  History of Present Illness   The patient presents with two primary concerns: a recurrent issue with their ear and a persistent skin lesion. The ear problem is described as a painful bump, similar to a condition they experienced in April (otitis externa that improved with oral and topical antibiotics). The patient reports that the bump was "popped" by their son, which provided some relief, but discomfort persists. They deny any associated symptoms such as fever, pressure, popping, or fluid in the ear.  The skin lesion, present for over a month, began after holding a goat. The patient has been applying an over-the-counter ointment without significant improvement. They deny any associated symptoms such as itching, pain, or drainage. The patient has a history of eczema and has been using a non-steroidal ointment, which has provided some relief but has not resolved the issue. The patient suspects the lesion may be ringworm due to its appearance and persistence.           ROS All review of systems negative except what is listed in the HPI      Objective:    BP 127/61   Pulse (!) 58   Temp 97.7 F (36.5 C) (Oral)   Ht 5\' 6"  (1.676 m)   Wt 176 lb (79.8 kg)   LMP 10/06/2015 (Approximate)   SpO2 98%   BMI 28.41 kg/m    Physical Exam Vitals reviewed.  Constitutional:      Appearance: Normal appearance.  HENT:     Right Ear: Tympanic membrane normal.     Left Ear: Tympanic membrane normal.     Ears:     Comments: Right canal with erythema, inflammation, and area of possible abscess/pimple that son popped the day prior Musculoskeletal:      Comments: Right forearm with ringworm rash approximately 6 cm diameter  Neurological:     Mental Status: She is alert and oriented to person, place, and time.  Psychiatric:        Mood and Affect: Mood normal.        Behavior: Behavior normal.        Thought Content: Thought content normal.        Judgment: Judgment normal.     No results found for any visits on 11/14/22.      Assessment & Plan:   Problem List Items Addressed This Visit   None Visit Diagnoses     Acute otitis externa of right ear, unspecified type    -  Primary - start antibiotics - warm compresses 2-3 times per day - try not to pick at the ear - monitor for any new or worsening symptoms and follow-up if needed    Relevant Medications   amoxicillin-clavulanate (AUGMENTIN) 875-125 MG tablet   Ringworm     - information sheet attached - sending in antifungal cream for you (use for 3-4 weeks)    Relevant Medications   clotrimazole (LOTRIMIN) 1 % cream       Meds ordered this encounter  Medications   amoxicillin-clavulanate (AUGMENTIN) 875-125 MG tablet    Sig: Take 1 tablet  by mouth 2 (two) times daily for 7 days.    Dispense:  14 tablet    Refill:  0    Order Specific Question:   Supervising Provider    Answer:   Danise Edge A [[id number]]   clotrimazole (LOTRIMIN) 1 % cream    Sig: Apply 1 Application topically 2 (two) times daily.    Dispense:  30 g    Refill:  0    Order Specific Question:   Supervising Provider    Answer:   Danise Edge A [[id number]]    Return if symptoms worsen or fail to improve.  Clayborne Dana, NP

## 2022-11-14 NOTE — Patient Instructions (Addendum)
Ear infection/abscess: - start antibiotics - warm compresses 2-3 times per day - try not to pick at the ear - monitor for any new or worsening symptoms and follow-up if needed  Ringworm: - information sheet attached - sending in antifungal cream for you (use for 3-4 weeks)

## 2022-11-17 ENCOUNTER — Encounter (INDEPENDENT_AMBULATORY_CARE_PROVIDER_SITE_OTHER): Payer: Self-pay

## 2022-11-20 LAB — GLUCOSE, POCT (MANUAL RESULT ENTRY): Glucose Fasting, POC: 134 mg/dL — AB (ref 70–99)

## 2022-12-15 NOTE — Progress Notes (Deleted)
Claudia Gregory [address], [address] [place], [place] [address] [phone]  Date:  12/19/2022   Name:  Claudia Gregory   DOB:  [birth year]   MRN:  [id number]  PCP:  Pearline Cables, MD    Chief Complaint: No chief complaint on file.   History of Present Illness:  Claudia Gregory is a 62 y.o. very pleasant female patient who presents with the following:  Pt seen today for recheck visit  History of deafness, hypertension, GERD, hyperlipidemia, obesity, peripheral neuropathy, diabetes, fatty liver with elevation of LFT   Last seen by myself in June - at that time she had some sx of hypoglycemia.  I had her decrease her dose of metformin and also of farxiga   Eye exam Flu shot Covid booster Urine micro can be updated  Lab Results  Component Value Date   HGBA1C 7.1 (H) 09/29/2022   Labs done in June-   Patient Active Problem List   Diagnosis Date Noted   Gross hematuria 04/28/2022   Dysuria 04/28/2022   Controlled type 2 diabetes mellitus without complication, without long-term current use of insulin (HCC) 07/11/2019   Generalized anxiety disorder 12/28/2015   Elevated liver enzymes 12/28/2015   Plantar fasciitis of right foot 04/01/2015   Neuropathic pain of both feet 10/27/2014   Hx of adenomatous polyp of colon 08/08/2014   Deafness 08/04/2014   GERD (gastroesophageal reflux disease) 05/13/2014   Hyperlipidemia 02/05/2013   Hot flashes 11/24/2012   OBESITY 10/23/2008   Hypertension 10/23/2008   BIPOLAR DISORDER UNSPECIFIED 09/02/2008    Past Medical History:  Diagnosis Date   Anxiety    Bipolar 1 disorder (HCC)    Carpal tunnel syndrome of left wrist    Chest pain, atypical    Deaf    Depression    Diabetes mellitus without complication (HCC)    past hx- off meds    FATIGUE    Gallstones    GERD (gastroesophageal reflux disease)    Hx of adenomatous polyp of colon 08/08/2014   Hyperlipidemia     Hypertension    OBESITY    Pneumonia     Past Surgical History:  Procedure Laterality Date   CHOLECYSTECTOMY  2000   COLONOSCOPY  2016   5 mm adenoma   POLYPECTOMY     TUBAL LIGATION  1998   UPPER GASTROINTESTINAL ENDOSCOPY      Social History   Tobacco Use   Smoking status: Never   Smokeless tobacco: Never  Vaping Use   Vaping status: Never Used  Substance Use Topics   Alcohol use: Yes    Alcohol/week: 2.0 standard drinks of alcohol    Types: 2 Glasses of wine per week    Comment: rare glass of wine    Drug use: No    Family History  Problem Relation Age of Onset   Brain cancer Mother    Hypertension Mother    Diabetes Mother    Diabetes Maternal Aunt    Heart attack Maternal Grandmother    Hypertension Maternal Grandmother    Colon cancer Neg Hx    Rectal cancer Neg Hx    Stomach cancer Neg Hx    Colon polyps Neg Hx    Esophageal cancer Neg Hx     No Known Allergies  Medication list has been reviewed and updated.  Current Outpatient Medications on File Prior to Visit  Medication Sig Dispense Refill   clonazePAM (  KLONOPIN) 0.5 MG tablet Take 0.5-1 tablets (0.25-0.5 mg total) by mouth 2 (two) times daily as needed for anxiety. 30 tablet 1   clotrimazole (LOTRIMIN) 1 % cream Apply 1 Application topically 2 (two) times daily. 30 g 0   dapagliflozin propanediol (FARXIGA) 10 MG TABS tablet Take 1 tablet (10 mg total) by mouth daily before breakfast. 90 tablet 3   losartan-hydrochlorothiazide (HYZAAR) 50-12.5 MG tablet TAKE 1 TABLET BY MOUTH EVERY DAY 90 tablet 1   Melatonin 10 MG TABS Take by mouth. 2 tabs nightly     metFORMIN (GLUCOPHAGE) 500 MG tablet TAKE 2 TABLETS BY MOUTH EVERY MORNING AND 1 TAB AT PM 270 tablet 3   rosuvastatin (CRESTOR) 20 MG tablet TAKE 1 TABLET BY MOUTH EVERY DAY 90 tablet 3   sertraline (ZOLOFT) 100 MG tablet TAKE 1&1/2 TAB DAILY 135 tablet 3   No current facility-administered medications on file prior to visit.    Review of  Systems:  As per HPI- otherwise negative.   Physical Examination: There were no vitals filed for this visit. There were no vitals filed for this visit. There is no height or weight on file to calculate BMI. Ideal Body Weight:    GEN: no acute distress. HEENT: Atraumatic, Normocephalic.  Ears and Nose: No external deformity. CV: RRR, No M/G/R. No JVD. No thrill. No extra heart sounds. PULM: CTA B, no wheezes, crackles, rhonchi. No retractions. No resp. distress. No accessory muscle use. ABD: S, NT, ND, +BS. No rebound. No HSM. EXTR: No c/c/e PSYCH: Normally interactive. Conversant.    Assessment and Plan: ***  Signed Abbe Amsterdam, MD

## 2022-12-19 ENCOUNTER — Ambulatory Visit: Payer: BC Managed Care – PPO | Admitting: Family Medicine

## 2022-12-19 DIAGNOSIS — I10 Essential (primary) hypertension: Secondary | ICD-10-CM

## 2022-12-19 DIAGNOSIS — E119 Type 2 diabetes mellitus without complications: Secondary | ICD-10-CM

## 2023-01-18 NOTE — Progress Notes (Addendum)
[facility] [address], [address] [place], [place] [address] [phone]  Date:  01/19/2023   Name:  Claudia Gregory   DOB:  1960-04-05   MRN:  [id number]  PCP:  Pearline Cables, MD    Chief Complaint: Follow-up (Concerns/ questions: 1. craving sweets still. 2. Fatigue/Eye exam: none/Flu shot: [facility])   History of Present Illness:  [name] is a 62 y.o. very pleasant female patient who presents with the following:  Patient seen today for periodic follow-up-most recent visit with myself was in June History of deafness, hypertension, GERD, hyperlipidemia, obesity, peripheral neuropathy, diabetes, fatty liver with elevation of LFT  Seen today accompanied by her husband and with aid of ASL interpreter  At her last visit she was having symptoms of hypoglycemia-we decreased her oral medications a bit, slightly decreased dose of metformin and decreased her Marcelline Deist to half tablet.  Will need to check A1c today She ended up going back to a whole tablet of farxiga as she was afraid her weight was going up  She is cravings sweets more recently   Lab Results  Component Value Date   HGBA1C 7.1 (H) 09/29/2022   Eye exam-up-to-date Flu shot- done already  COVID booster-recommended Urine microalbumin- update today  Foot exam is due  Pap, mammogram, colonoscopy up-to-date  Farxiga Metformin losartan/hydrochlorothiazide Crestor Sertraline  Discussed a CT coronary- she declines for now   Patient Active Problem List   Diagnosis Date Noted   Gross hematuria 04/28/2022   Dysuria 04/28/2022   Controlled type 2 diabetes mellitus without complication, without long-term current use of insulin (HCC) 07/11/2019   Generalized anxiety disorder 12/28/2015   Elevated liver enzymes 12/28/2015   Plantar fasciitis of right foot 04/01/2015   Neuropathic pain of both feet 10/27/2014   Hx of adenomatous polyp of colon 08/08/2014   Deafness  08/04/2014   GERD (gastroesophageal reflux disease) 05/13/2014   Hyperlipidemia 02/05/2013   Hot flashes 11/24/2012   OBESITY 10/23/2008   Hypertension 10/23/2008   BIPOLAR DISORDER UNSPECIFIED 09/02/2008    Past Medical History:  Diagnosis Date   Anxiety    Bipolar 1 disorder (HCC)    Carpal tunnel syndrome of left wrist    Chest pain, atypical    Deaf    Depression    Diabetes mellitus without complication (HCC)    past hx- off meds    FATIGUE    Gallstones    GERD (gastroesophageal reflux disease)    Hx of adenomatous polyp of colon 08/08/2014   Hyperlipidemia    Hypertension    OBESITY    Pneumonia     Past Surgical History:  Procedure Laterality Date   CHOLECYSTECTOMY  2000   COLONOSCOPY  2016   5 mm adenoma   POLYPECTOMY     TUBAL LIGATION  1998   UPPER GASTROINTESTINAL ENDOSCOPY      Social History   Tobacco Use   Smoking status: Never   Smokeless tobacco: Never  Vaping Use   Vaping status: Never Used  Substance Use Topics   Alcohol use: Yes    Alcohol/week: 2.0 standard drinks of alcohol    Types: 2 Glasses of wine per week    Comment: rare glass of wine    Drug use: No    Family History  Problem Relation Age of Onset   Brain cancer Mother    Hypertension Mother    Diabetes Mother    Diabetes Maternal  Aunt    Heart attack Maternal Grandmother    Hypertension Maternal Grandmother    Colon cancer Neg Hx    Rectal cancer Neg Hx    Stomach cancer Neg Hx    Colon polyps Neg Hx    Esophageal cancer Neg Hx     No Known Allergies  Medication list has been reviewed and updated.  Current Outpatient Medications on File Prior to Visit  Medication Sig Dispense Refill   dapagliflozin propanediol (FARXIGA) 10 MG TABS tablet Take 1 tablet (10 mg total) by mouth daily before breakfast. 90 tablet 3   losartan-hydrochlorothiazide (HYZAAR) 50-12.5 MG tablet TAKE 1 TABLET BY MOUTH EVERY DAY 90 tablet 1   metFORMIN (GLUCOPHAGE) 500 MG tablet TAKE 2 TABLETS  BY MOUTH EVERY MORNING AND 1 TAB AT PM 270 tablet 3   rosuvastatin (CRESTOR) 20 MG tablet TAKE 1 TABLET BY MOUTH EVERY DAY 90 tablet 3   sertraline (ZOLOFT) 100 MG tablet TAKE 1&1/2 TAB DAILY 135 tablet 3   No current facility-administered medications on file prior to visit.    Review of Systems:  As per HPI- otherwise negative.   Physical Examination: Vitals:   01/19/23 1357  BP: 124/74  Pulse: 64  Resp: 18  Temp: 97.8 F (36.6 C)  SpO2: 97%   Vitals:   01/19/23 1357  Weight: 175 lb (79.4 kg)   Body mass index is 28.25 kg/m. Ideal Body Weight:    GEN: no acute distress.  Mildly overweight, looks well HEENT: Atraumatic, Normocephalic.  Ears and Nose: No external deformity. CV: RRR, No M/G/R. No JVD. No thrill. No extra heart sounds. PULM: CTA B, no wheezes, crackles, rhonchi. No retractions. No resp. distress. No accessory muscle use. ABD: S, NT, ND. No rebound. No HSM. EXTR: No c/c/e PSYCH: Normally interactive. Conversant.  Foot exam completed today  Assessment and Plan: Essential hypertension - Plan: Basic metabolic panel  Controlled type 2 diabetes mellitus without complication, without long-term current use of insulin (HCC) - Plan: Hemoglobin A1c, Microalbumin / creatinine urine ratio  Screening for thyroid disorder - Plan: TSH  Hyperlipidemia, unspecified hyperlipidemia type - Plan: Lipid panel  Follow-up.  Blood pressure under good control on current regimen Check diabetes control with A1c as above.  She has had some concerns about hypoglycemia symptoms with Marcelline Deist, but on the other hand we want to keep her diabetes under good control.  I brought up the idea of a GLP-1 today, she is not sure about doing injections at home Screening for thyroid disorder Taking Crestor 20, check lipids Will plan further follow- up pending labs.   Signed Abbe Amsterdam, MD  Addendum 10/19, received labs as below.  Message to patient  Results for orders placed or  performed in visit on 01/19/23  Basic metabolic panel  Result Value Ref Range   Sodium 137 135 - 145 mEq/L   Potassium 4.0 3.5 - 5.1 mEq/L   Chloride 99 96 - 112 mEq/L   CO2 28 19 - 32 mEq/L   Glucose, Bld 147 (H) 70 - 99 mg/dL   BUN 14 6 - 23 mg/dL   Creatinine, Ser 8.41 0.40 - 1.20 mg/dL   GFR 32.44 >01.02 mL/min   Calcium 10.5 8.4 - 10.5 mg/dL  Hemoglobin V2Z  Result Value Ref Range   Hgb A1c MFr Bld 7.8 (H) 4.6 - 6.5 %  Microalbumin / creatinine urine ratio  Result Value Ref Range   Microalb, Ur 1.2 0.0 - 1.9 mg/dL   Creatinine,U  120.7 mg/dL   Microalb Creat Ratio 1.0 0.0 - 30.0 mg/g  Lipid panel  Result Value Ref Range   Cholesterol 175 0 - 200 mg/dL   Triglycerides 578.4 (H) 0.0 - 149.0 mg/dL   HDL 69.62 >95.28 mg/dL   VLDL 41.3 0.0 - 24.4 mg/dL   LDL Cholesterol 82 0 - 99 mg/dL   Total CHOL/HDL Ratio 3    NonHDL 112.25   TSH  Result Value Ref Range   TSH 4.23 0.35 - 5.50 uIU/mL

## 2023-01-18 NOTE — Patient Instructions (Incomplete)
It was good to see you again today, I will be in touch with your lab results

## 2023-01-19 ENCOUNTER — Ambulatory Visit (INDEPENDENT_AMBULATORY_CARE_PROVIDER_SITE_OTHER): Payer: BC Managed Care – PPO | Admitting: Family Medicine

## 2023-01-19 VITALS — BP 124/74 | HR 64 | Temp 97.8°F | Resp 18 | Wt 175.0 lb

## 2023-01-19 DIAGNOSIS — Z7985 Long-term (current) use of injectable non-insulin antidiabetic drugs: Secondary | ICD-10-CM

## 2023-01-19 DIAGNOSIS — Z1329 Encounter for screening for other suspected endocrine disorder: Secondary | ICD-10-CM | POA: Diagnosis not present

## 2023-01-19 DIAGNOSIS — E785 Hyperlipidemia, unspecified: Secondary | ICD-10-CM

## 2023-01-19 DIAGNOSIS — E119 Type 2 diabetes mellitus without complications: Secondary | ICD-10-CM

## 2023-01-19 DIAGNOSIS — I10 Essential (primary) hypertension: Secondary | ICD-10-CM | POA: Diagnosis not present

## 2023-01-20 LAB — LIPID PANEL
Cholesterol: 175 mg/dL (ref 0–200)
HDL: 63.2 mg/dL (ref >39.00)
LDL Cholesterol: 82 mg/dL (ref 0–99)
NonHDL: 112.25
Total CHOL/HDL Ratio: 3
Triglycerides: 150 mg/dL — ABNORMAL HIGH (ref 0.0–149.0)
VLDL: 30 mg/dL (ref 0.0–40.0)

## 2023-01-20 LAB — BASIC METABOLIC PANEL
BUN: 14 mg/dL (ref 6–23)
CO2: 28 meq/L (ref 19–32)
Calcium: 10.5 mg/dL (ref 8.4–10.5)
Chloride: 99 meq/L (ref 96–112)
Creatinine, Ser: 0.59 mg/dL (ref 0.40–1.20)
GFR: 98.31 mL/min (ref >60.00)
Glucose, Bld: 147 mg/dL — ABNORMAL HIGH (ref 70–99)
Potassium: 4 meq/L (ref 3.5–5.1)
Sodium: 137 meq/L (ref 135–145)

## 2023-01-20 LAB — MICROALBUMIN / CREATININE URINE RATIO
Creatinine,U: 120.7 mg/dL
Microalb Creat Ratio: 1 mg/g (ref 0.0–30.0)
Microalb, Ur: 1.2 mg/dL (ref 0.0–1.9)

## 2023-01-20 LAB — TSH: TSH: 4.23 u[IU]/mL (ref 0.35–5.50)

## 2023-01-20 LAB — HEMOGLOBIN A1C: Hgb A1c MFr Bld: 7.8 % — ABNORMAL HIGH (ref 4.6–6.5)

## 2023-01-21 ENCOUNTER — Encounter: Payer: Self-pay | Admitting: Family Medicine

## 2023-01-21 DIAGNOSIS — E119 Type 2 diabetes mellitus without complications: Secondary | ICD-10-CM

## 2023-01-21 MED ORDER — TIRZEPATIDE 2.5 MG/0.5ML ~~LOC~~ SOAJ: 2.5000 mg | SUBCUTANEOUS | 1 refills | Status: DC

## 2023-01-21 NOTE — Addendum Note (Signed)
Addended by: Abbe Amsterdam C on: 01/21/2023 05:55 PM   Modules accepted: Orders

## 2023-01-26 ENCOUNTER — Telehealth: Payer: Self-pay | Admitting: Pharmacist

## 2023-01-26 ENCOUNTER — Encounter: Payer: Self-pay | Admitting: Family Medicine

## 2023-01-26 ENCOUNTER — Other Ambulatory Visit (HOSPITAL_COMMUNITY): Payer: Self-pay

## 2023-01-26 NOTE — Telephone Encounter (Signed)
Pharmacy Patient Advocate Encounter  Received notification from [facility] that Prior Authorization for [facility] 2.5MG /0.5ML auto-injectors has been APPROVED from 01/26/23 to 01/26/24. Ran test claim, Copay is $0.00. This test claim was processed through [facility]- copay amounts may vary at other pharmacies due to pharmacy/plan contracts, or as the patient moves through the different stages of their insurance plan.   PA #/Case ID/Reference #: [id number]

## 2023-01-31 NOTE — Telephone Encounter (Signed)
Pt called to schedule appt for someone to show her how to administer her mounjaro. Unable to confirm with clinic team so advised patient, someone will be in touch to advise

## 2023-01-31 NOTE — Telephone Encounter (Signed)
Spoke with CMA and was advised [place] is fine for patient to be shown how to administer mounjaro injection. Called patient back and sign language interpreter lvm requesting call back from patient to schedule.

## 2023-02-09 ENCOUNTER — Ambulatory Visit: Payer: BC Managed Care – PPO

## 2023-02-09 ENCOUNTER — Other Ambulatory Visit: Payer: Self-pay | Admitting: Family Medicine

## 2023-02-09 DIAGNOSIS — E119 Type 2 diabetes mellitus without complications: Secondary | ICD-10-CM

## 2023-02-09 DIAGNOSIS — Z7985 Long-term (current) use of injectable non-insulin antidiabetic drugs: Secondary | ICD-10-CM

## 2023-02-09 NOTE — Progress Notes (Signed)
Patient education given on Mounjaro and the patient expresses understanding and acceptance of instructions. 1st dose administered here in office.   Pt was accompanied by on sight ASL interpreter   Creft, Feliberto Harts 02/09/2023 11:42 AM   Dr Abner Greenspan: DOD

## 2023-02-13 ENCOUNTER — Encounter: Payer: Self-pay | Admitting: Family Medicine

## 2023-03-31 ENCOUNTER — Other Ambulatory Visit: Payer: Self-pay | Admitting: Family Medicine

## 2023-04-29 ENCOUNTER — Ambulatory Visit
Admission: EM | Admit: 2023-04-29 | Discharge: 2023-04-29 | Disposition: A | Discharge status: Home / Self Care | Payer: BC Managed Care – PPO | Attending: Family Medicine | Admitting: Family Medicine

## 2023-04-29 ENCOUNTER — Other Ambulatory Visit: Payer: Self-pay

## 2023-04-29 DIAGNOSIS — M62838 Other muscle spasm: Secondary | ICD-10-CM | POA: Diagnosis not present

## 2023-04-29 DIAGNOSIS — M542 Cervicalgia: Secondary | ICD-10-CM | POA: Diagnosis not present

## 2023-04-29 MED ORDER — CYCLOBENZAPRINE HCL 5 MG PO TABS: 5.0000 mg | ORAL_TABLET | Freq: Three times a day (TID) | ORAL | 0 refills | Status: AC | PRN

## 2023-04-29 NOTE — ED Provider Notes (Signed)
[facility]    CSN: [id number] Arrival date & time: 04/29/23  1441      History   Chief Complaint Chief Complaint  Patient presents with   Neck Pain    HPI Claudia Gregory is a 63 y.o. female.   The history is provided by the patient. The history is limited by a language barrier. A language interpreter was used.  Neck Pain Associated symptoms: no fever   Sign language interpreter Lurena Joiner via iPad For entire physical and history Left-sided neck pain for approximately 1 month, onset after she got a massage constant, worse with turning to the side, no injury has had similar symptoms in the past secondary to arthritis.  States her neck feels as if it needs to "pop". Has tried topical creams and balm's, Tylenol and Aleve, has taken meloxicam which she was prescribed for another problem that relief. Denies paresthesias, weakness, rashes or skin changes, chest pain, shortness of breath, sore throat, cough.  Admits mild headache and intermittent dizziness.  Past Medical History:  Diagnosis Date   Anxiety    Bipolar 1 disorder (HCC)    Carpal tunnel syndrome of left wrist    Chest pain, atypical    Deaf    Depression    Diabetes mellitus without complication (HCC)    past hx- off meds    FATIGUE    Gallstones    GERD (gastroesophageal reflux disease)    Hx of adenomatous polyp of colon 08/08/2014   Hyperlipidemia    Hypertension    OBESITY    Pneumonia     Patient Active Problem List   Diagnosis Date Noted   Gross hematuria 04/28/2022   Dysuria 04/28/2022   Controlled type 2 diabetes mellitus without complication, without long-term current use of insulin (HCC) 07/11/2019   Generalized anxiety disorder 12/28/2015   Elevated liver enzymes 12/28/2015   Plantar fasciitis of right foot 04/01/2015   Neuropathic pain of both feet 10/27/2014   Hx of adenomatous polyp of colon 08/08/2014   Deafness 08/04/2014   GERD (gastroesophageal reflux disease) 05/13/2014    Hyperlipidemia 02/05/2013   Hot flashes 11/24/2012   OBESITY 10/23/2008   Hypertension 10/23/2008   BIPOLAR DISORDER UNSPECIFIED 09/02/2008    Past Surgical History:  Procedure Laterality Date   CHOLECYSTECTOMY  2000   COLONOSCOPY  2016   5 mm adenoma   POLYPECTOMY     TUBAL LIGATION  1998   UPPER GASTROINTESTINAL ENDOSCOPY      OB History   No obstetric history on file.      Home Medications    Prior to Admission medications   Medication Sig Start Date End Date Taking? Authorizing Provider  cyclobenzaprine (FLEXERIL) 5 MG tablet Take 1 tablet (5 mg total) by mouth 3 (three) times daily as needed for up to 5 days for muscle spasms. 04/29/23 05/04/23 Yes Jmichael Gille, PA  FARXIGA 10 MG TABS tablet TAKE 1 TABLET BY MOUTH DAILY BEFORE BREAKFAST. 02/09/23   Copland, Gwenlyn Found, MD  losartan-hydrochlorothiazide (HYZAAR) 50-12.5 MG tablet TAKE 1 TABLET BY MOUTH EVERY DAY 11/15/22   Copland, Gwenlyn Found, MD  metFORMIN (GLUCOPHAGE) 500 MG tablet TAKE 2 TABLETS BY MOUTH EVERY MORNING AND 1 TAB AT PM 11/14/22   Copland, Gwenlyn Found, MD  rosuvastatin (CRESTOR) 20 MG tablet TAKE 1 TABLET BY MOUTH EVERY DAY 11/14/22   Copland, Gwenlyn Found, MD  sertraline (ZOLOFT) 100 MG tablet TAKE 1&1/2 TAB DAILY 11/14/22   Copland, Gwenlyn Found, MD  tirzepatide [facility])  2.5 MG/0.5ML Pen INJECT 2.5 MG SUBCUTANEOUSLY WEEKLY 03/31/23   Copland, Gwenlyn Found, MD    Family History Family History  Problem Relation Age of Onset   Brain cancer Mother    Hypertension Mother    Diabetes Mother    Diabetes Maternal Aunt    Heart attack Maternal Grandmother    Hypertension Maternal Grandmother    Colon cancer Neg Hx    Rectal cancer Neg Hx    Stomach cancer Neg Hx    Colon polyps Neg Hx    Esophageal cancer Neg Hx     Social History Social History   Tobacco Use   Smoking status: Never   Smokeless tobacco: Never  Vaping Use   Vaping status: Never Used  Substance Use Topics   Alcohol use: Yes    Alcohol/week:  2.0 standard drinks of alcohol    Types: 2 Glasses of wine per week    Comment: rare glass of wine    Drug use: No     Allergies   Patient has no known allergies.   Review of Systems Review of Systems  Constitutional:  Negative for fatigue and fever.  Respiratory:  Negative for cough.   Musculoskeletal:  Positive for neck pain.     Physical Exam Triage Vital Signs ED Triage Vitals  Encounter Vitals Group     BP 04/29/23 1500 122/80     Systolic BP Percentile --      Diastolic BP Percentile --      Pulse Rate 04/29/23 1500 70     Resp 04/29/23 1500 18     Temp 04/29/23 1500 97.9 F (36.6 C)     Temp Source 04/29/23 1500 Oral     SpO2 04/29/23 1500 94 %     Weight 04/29/23 1502 170 lb (77.1 kg)     Height 04/29/23 1502 5\' 6"  (1.676 m)     Head Circumference --      Peak Flow --      Pain Score 04/29/23 1501 8     Pain Loc --      Pain Education --      Exclude from Growth Chart --    No data found.  Updated Vital Signs BP 122/80 (BP Location: Right Arm)   Pulse 70   Temp 97.9 F (36.6 C) (Oral)   Resp 18   Ht 5\' 6"  (1.676 m)   Wt 170 lb (77.1 kg)   LMP 10/06/2015 (Approximate)   SpO2 94%   BMI 27.44 kg/m   Visual Acuity Right Eye Distance:   Left Eye Distance:   Bilateral Distance:    Right Eye Near:   Left Eye Near:    Bilateral Near:     Physical Exam Vitals and nursing note reviewed.  Constitutional:      Appearance: She is not ill-appearing.  HENT:     Head: Normocephalic and atraumatic.     Right Ear: Tympanic membrane and ear canal normal.     Left Ear: Tympanic membrane and ear canal normal.     Mouth/Throat:     Mouth: Mucous membranes are moist.  Eyes:     Extraocular Movements: Extraocular movements intact.     Conjunctiva/sclera: Conjunctivae normal.  Cardiovascular:     Rate and Rhythm: Normal rate.     Heart sounds: Normal heart sounds.  Pulmonary:     Effort: Pulmonary effort is normal.     Breath sounds: Normal breath  sounds.  Musculoskeletal:  Cervical back: Neck supple. No tenderness. Normal range of motion.     Thoracic back: Spasms (Left upper trapezius near midline) present. No bony tenderness.  Skin:    General: Skin is warm and dry.  Neurological:     Mental Status: She is alert and oriented to person, place, and time.     Cranial Nerves: No cranial nerve deficit.     Sensory: No sensory deficit.     Coordination: Coordination normal.     Gait: Gait normal.  Psychiatric:        Mood and Affect: Mood normal.      UC Treatments / Results  Labs (all labs ordered are listed, but only abnormal results are displayed) Labs Reviewed - No data to display  EKG   Radiology No results found.  Procedures Procedures (including critical care time)  Medications Ordered in UC Medications - No data to display  Initial Impression / Assessment and Plan / UC Course  I have reviewed the triage vital signs and the nursing notes.     63 year old with pain left side of neck for 1 month no injury, has some trapezius spasm on exam, will treat with Flexeril 5 mg, continue OTC NSAIDs, consider Salonpas lidocaine patch, follow-up with PCP if no improvement for possible physical therapy Final Clinical Impressions(s) / UC Diagnoses   Final diagnoses:  Neck pain  Muscle spasm     Discharge Instructions      Continue over-the-counter Aleve 2 tablets every 12 hours with food as needed for pain Continue moist heat application Consider Salonpas pain patch to affected area Follow-up with your doctor if symptoms fail to improve   ED Prescriptions     Medication Sig Dispense Auth. Provider   cyclobenzaprine (FLEXERIL) 5 MG tablet Take 1 tablet (5 mg total) by mouth 3 (three) times daily as needed for up to 5 days for muscle spasms. 15 tablet Meliton Rattan, [place]      PDMP not reviewed this encounter.   Meliton Rattan, [place] 04/29/23 [phone]

## 2023-04-29 NOTE — ED Triage Notes (Signed)
Claudia Gregory #[id number] interpreter used to triage patient.   Pt presents with complaints of posterior neck pain x 1 month. Pt is also having headaches and dizziness. Pt currently rates her neck pain an 8/10, unsure if it is arthritis. Only medications used are topical ones such as IcyHot with no relief.

## 2023-04-29 NOTE — Discharge Instructions (Addendum)
Continue over-the-counter Aleve 2 tablets every 12 hours with food as needed for pain Continue moist heat application Consider Salonpas pain patch to affected area Follow-up with your doctor if symptoms fail to improve

## 2023-05-10 ENCOUNTER — Encounter: Payer: Self-pay | Admitting: Family Medicine

## 2023-05-15 NOTE — Progress Notes (Deleted)
 [facility] [address], [address] [place], [place] [address] [phone]  Date:  05/17/2023   Name:  Claudia Gregory   DOB:  1960-10-02   MRN:  [id number]  PCP:  Pearline Cables, MD    Chief Complaint: No chief complaint on file.   History of Present Illness:  Claudia Gregory is a 63 y.o. very pleasant female patient who presents with the following:  Patient is seen today with a concern about her neck Most recent visit with myself was in October for routine follow-up History of deafness, hypertension, GERD, hyperlipidemia, obesity, peripheral neuropathy, diabetes, fatty liver with elevation of LFT  Seen today accompanied by her husband and with aid of ASL interpreter  We started her on Mounjaro in the meantime Lab Results  Component Value Date   HGBA1C 7.8 (H) 01/19/2023   She was seen at urgent care on 1/25 with concern of neck pain: Left-sided neck pain for approximately 1 month, onset after she got a massage constant, worse with turning to the side, no injury has had similar symptoms in the past secondary to arthritis.  States her neck feels as if it needs to "pop". Has tried topical creams and balm's, Tylenol and Aleve, has taken meloxicam which she was prescribed for another problem that relief. Denies paresthesias, weakness, rashes or skin changes, chest pain, shortness of breath, sore throat, cough.  Admits mild headache and intermittent dizziness.  The patient had reached out to me with concern of her neck still hurting.  I asked her to please come and see me so I can determine what the neck step should be and potentially get some films  Patient Active Problem List   Diagnosis Date Noted   Gross hematuria 04/28/2022   Dysuria 04/28/2022   Controlled type 2 diabetes mellitus without complication, without long-term current use of insulin (HCC) 07/11/2019   Generalized anxiety disorder 12/28/2015   Elevated liver  enzymes 12/28/2015   Plantar fasciitis of right foot 04/01/2015   Neuropathic pain of both feet 10/27/2014   Hx of adenomatous polyp of colon 08/08/2014   Deafness 08/04/2014   GERD (gastroesophageal reflux disease) 05/13/2014   Hyperlipidemia 02/05/2013   Hot flashes 11/24/2012   OBESITY 10/23/2008   Hypertension 10/23/2008   BIPOLAR DISORDER UNSPECIFIED 09/02/2008    Past Medical History:  Diagnosis Date   Anxiety    Bipolar 1 disorder (HCC)    Carpal tunnel syndrome of left wrist    Chest pain, atypical    Deaf    Depression    Diabetes mellitus without complication (HCC)    past hx- off meds    FATIGUE    Gallstones    GERD (gastroesophageal reflux disease)    Hx of adenomatous polyp of colon 08/08/2014   Hyperlipidemia    Hypertension    OBESITY    Pneumonia     Past Surgical History:  Procedure Laterality Date   CHOLECYSTECTOMY  2000   COLONOSCOPY  2016   5 mm adenoma   POLYPECTOMY     TUBAL LIGATION  1998   UPPER GASTROINTESTINAL ENDOSCOPY      Social History   Tobacco Use   Smoking status: Never   Smokeless tobacco: Never  Vaping Use   Vaping status: Never Used  Substance Use Topics   Alcohol use: Yes    Alcohol/week: 2.0 standard drinks of alcohol    Types: 2 Glasses of wine per week  Comment: rare glass of wine    Drug use: No    Family History  Problem Relation Age of Onset   Brain cancer Mother    Hypertension Mother    Diabetes Mother    Diabetes Maternal Aunt    Heart attack Maternal Grandmother    Hypertension Maternal Grandmother    Colon cancer Neg Hx    Rectal cancer Neg Hx    Stomach cancer Neg Hx    Colon polyps Neg Hx    Esophageal cancer Neg Hx     No Known Allergies  Medication list has been reviewed and updated.  Current Outpatient Medications on File Prior to Visit  Medication Sig Dispense Refill   FARXIGA 10 MG TABS tablet TAKE 1 TABLET BY MOUTH DAILY BEFORE BREAKFAST. 90 tablet 3   losartan-hydrochlorothiazide  (HYZAAR) 50-12.5 MG tablet TAKE 1 TABLET BY MOUTH EVERY DAY 90 tablet 1   metFORMIN (GLUCOPHAGE) 500 MG tablet TAKE 2 TABLETS BY MOUTH EVERY MORNING AND 1 TAB AT PM 270 tablet 3   rosuvastatin (CRESTOR) 20 MG tablet TAKE 1 TABLET BY MOUTH EVERY DAY 90 tablet 3   sertraline (ZOLOFT) 100 MG tablet TAKE 1&1/2 TAB DAILY 135 tablet 3   tirzepatide (MOUNJARO) 2.5 MG/0.5ML Pen INJECT 2.5 MG SUBCUTANEOUSLY WEEKLY 6 mL 0   No current facility-administered medications on file prior to visit.    Review of Systems:  As per HPI- otherwise negative.   Physical Examination: There were no vitals filed for this visit. There were no vitals filed for this visit. There is no height or weight on file to calculate BMI. Ideal Body Weight:    GEN: no acute distress. HEENT: Atraumatic, Normocephalic.  Ears and Nose: No external deformity. CV: RRR, No M/G/R. No JVD. No thrill. No extra heart sounds. PULM: CTA B, no wheezes, crackles, rhonchi. No retractions. No resp. distress. No accessory muscle use. ABD: S, NT, ND, +BS. No rebound. No HSM. EXTR: No c/c/e PSYCH: Normally interactive. Conversant.    Assessment and Plan: ***  Signed Abbe Amsterdam, MD

## 2023-05-17 ENCOUNTER — Ambulatory Visit: Payer: BC Managed Care – PPO | Admitting: Family Medicine
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
## 2023-05-22 ENCOUNTER — Ambulatory Visit: Payer: BC Managed Care – PPO | Admitting: Family Medicine

## 2023-05-22 ENCOUNTER — Ambulatory Visit (HOSPITAL_BASED_OUTPATIENT_CLINIC_OR_DEPARTMENT_OTHER)
Admission: RE | Admit: 2023-05-22 | Discharge: 2023-05-22 | Disposition: A | Discharge status: Home / Self Care | Payer: BC Managed Care – PPO | Source: Ambulatory Visit | Attending: Family Medicine | Admitting: Family Medicine

## 2023-05-22 ENCOUNTER — Encounter: Payer: Self-pay | Admitting: Family Medicine

## 2023-05-22 VITALS — BP 124/80 | HR 63 | Temp 98.4°F | Resp 18 | Ht 66.0 in | Wt 167.8 lb

## 2023-05-22 DIAGNOSIS — E119 Type 2 diabetes mellitus without complications: Secondary | ICD-10-CM

## 2023-05-22 DIAGNOSIS — Z7985 Long-term (current) use of injectable non-insulin antidiabetic drugs: Secondary | ICD-10-CM

## 2023-05-22 DIAGNOSIS — M542 Cervicalgia: Secondary | ICD-10-CM

## 2023-05-22 DIAGNOSIS — M436 Torticollis: Secondary | ICD-10-CM | POA: Diagnosis not present

## 2023-05-22 DIAGNOSIS — M50322 Other cervical disc degeneration at C5-C6 level: Secondary | ICD-10-CM | POA: Diagnosis not present

## 2023-05-22 DIAGNOSIS — F411 Generalized anxiety disorder: Secondary | ICD-10-CM

## 2023-05-22 DIAGNOSIS — M50321 Other cervical disc degeneration at C4-C5 level: Secondary | ICD-10-CM | POA: Diagnosis not present

## 2023-05-22 LAB — COMPREHENSIVE METABOLIC PANEL
ALT: 29 U/L (ref 0–35)
AST: 25 U/L (ref 0–37)
Albumin: 5.2 g/dL (ref 3.5–5.2)
Alkaline Phosphatase: 77 U/L (ref 39–117)
BUN: 12 mg/dL (ref 6–23)
CO2: 30 meq/L (ref 19–32)
Calcium: 10.3 mg/dL (ref 8.4–10.5)
Chloride: 96 meq/L (ref 96–112)
Creatinine, Ser: 0.55 mg/dL (ref 0.40–1.20)
GFR: 99.76 mL/min (ref >60.00)
Glucose, Bld: 121 mg/dL — ABNORMAL HIGH (ref 70–99)
Potassium: 3.9 meq/L (ref 3.5–5.1)
Sodium: 137 meq/L (ref 135–145)
Total Bilirubin: 0.6 mg/dL (ref 0.2–1.2)
Total Protein: 8 g/dL (ref 6.0–8.3)

## 2023-05-22 LAB — HEMOGLOBIN A1C: Hgb A1c MFr Bld: 6.2 % (ref 4.6–6.5)

## 2023-05-22 MED ORDER — SERTRALINE HCL 100 MG PO TABS: 100.0000 mg | ORAL_TABLET | Freq: Every day | ORAL | 3 refills | Status: AC

## 2023-05-22 NOTE — Patient Instructions (Signed)
It was good to see you today- I will be in touch with your labs and x-rays asap via mychart

## 2023-05-28 ENCOUNTER — Encounter: Payer: Self-pay | Admitting: Family Medicine

## 2023-06-14 DIAGNOSIS — M542 Cervicalgia: Secondary | ICD-10-CM | POA: Diagnosis not present

## 2023-06-27 ENCOUNTER — Other Ambulatory Visit: Payer: Self-pay | Admitting: Family Medicine

## 2023-06-29 ENCOUNTER — Encounter: Payer: Self-pay | Admitting: Family Medicine

## 2023-06-29 NOTE — Telephone Encounter (Signed)
 Pts Greggory Keen is now too expensive, should we refer to Tammy?

## 2023-07-05 DIAGNOSIS — M542 Cervicalgia: Secondary | ICD-10-CM | POA: Diagnosis not present

## 2023-07-12 DIAGNOSIS — M542 Cervicalgia: Secondary | ICD-10-CM | POA: Diagnosis not present

## 2023-07-14 DIAGNOSIS — M542 Cervicalgia: Secondary | ICD-10-CM | POA: Diagnosis not present

## 2023-07-18 DIAGNOSIS — M542 Cervicalgia: Secondary | ICD-10-CM | POA: Diagnosis not present

## 2023-07-21 DIAGNOSIS — M542 Cervicalgia: Secondary | ICD-10-CM | POA: Diagnosis not present

## 2023-07-25 DIAGNOSIS — M542 Cervicalgia: Secondary | ICD-10-CM | POA: Diagnosis not present

## 2023-07-27 ENCOUNTER — Encounter: Payer: Self-pay | Admitting: Family Medicine

## 2023-07-28 ENCOUNTER — Ambulatory Visit: Admitting: Internal Medicine

## 2023-07-28 ENCOUNTER — Encounter: Payer: Self-pay | Admitting: Internal Medicine

## 2023-07-28 VITALS — BP 122/84 | HR 64 | Temp 98.6°F | Resp 16 | Ht 66.0 in | Wt 169.4 lb

## 2023-07-28 DIAGNOSIS — E11621 Type 2 diabetes mellitus with foot ulcer: Secondary | ICD-10-CM | POA: Diagnosis not present

## 2023-07-28 DIAGNOSIS — Z7985 Long-term (current) use of injectable non-insulin antidiabetic drugs: Secondary | ICD-10-CM | POA: Diagnosis not present

## 2023-07-28 DIAGNOSIS — L97511 Non-pressure chronic ulcer of other part of right foot limited to breakdown of skin: Secondary | ICD-10-CM

## 2023-07-28 MED ORDER — CEPHALEXIN 500 MG PO CAPS: 500.0000 mg | ORAL_CAPSULE | Freq: Four times a day (QID) | ORAL | 0 refills | Status: DC

## 2023-07-28 NOTE — Progress Notes (Signed)
 Subjective:    Patient ID: Claudia Gregory, female    DOB: [birth year], 63 y.o.   MRN: [id number]  DOS:  07/28/2023 Type of visit - description: Acute.  Visit assisted by a sign language  interpreter via video.  2 to 3 days ago noted a wound at the 3rd and 4th right toes. No discharge, bleed x1  . No blister prior to this finding. Denies fever or chills. In the past she was told she has neuropathy, denies recent numbness or burning on her feet. No history of PVD, denies claudication.  Review of Systems See above   Past Medical History:  Diagnosis Date   Anxiety    Bipolar 1 disorder (HCC)    Carpal tunnel syndrome of left wrist    Chest pain, atypical    Deaf    Depression    Diabetes mellitus without complication (HCC)    past hx- off meds    FATIGUE    Gallstones    GERD (gastroesophageal reflux disease)    Hx of adenomatous polyp of colon 08/08/2014   Hyperlipidemia    Hypertension    OBESITY    Pneumonia     Past Surgical History:  Procedure Laterality Date   CHOLECYSTECTOMY  2000   COLONOSCOPY  2016   5 mm adenoma   POLYPECTOMY     TUBAL LIGATION  1998   UPPER GASTROINTESTINAL ENDOSCOPY      Current Outpatient Medications  Medication Instructions   Farxiga  10 mg, Oral, Daily before breakfast   losartan -hydrochlorothiazide  (HYZAAR) 50-12.5 MG tablet 1 tablet, Oral, Daily   metFORMIN  (GLUCOPHAGE ) 500 MG tablet TAKE 2 TABLETS BY MOUTH EVERY MORNING AND 1 TAB AT PM   Mounjaro  2.5 mg, Subcutaneous, Weekly   rosuvastatin  (CRESTOR ) 20 mg, Oral, Daily   sertraline  (ZOLOFT ) 100 mg, Oral, Daily at bedtime       Objective:   Physical Exam BP 122/84   Pulse 64   Temp 98.6 F (37 C) (Oral)   Resp 16   Ht 5\' 6"  (1.676 m)   Wt 169 lb 6 oz (76.8 kg)   LMP 10/06/2015 (Approximate)   SpO2 97%   BMI 27.34 kg/m  General:   Well developed, NAD, BMI noted. HEENT:  Normocephalic . Face symmetric, atraumatic Lower extremities: No edema, good pedal pulses  bilaterally. 3rd and 4th right toes: See picture, has a very shallow ulcer on the third toe and a more noticeable one on the fourth. Medial aspect of the fourth toe is slightly swollen.  No fluctuance. Decrease sensation to monofilament B foot distally  Neurologic:  alert & oriented X3.  Speech normal, gait appropriate for age and unassisted Psych--  Cognition and judgment appear intact.  Cooperative with normal attention span and concentration.  Behavior appropriate. No anxious or depressed appearing.      Assessment     63 year old female, PMH includes DM, high cholesterol, neuropathic pain on the feet, HTN, anxiety, presents with  Diabetic foot ulcer. The patient has a history of diabetes, she has neuropathy confirmed on exam today. She has a grade 0 ulcer, possibly with a mild infection. No evidence of PVD. Plan: - Avoid pressure in the area, she has 2 part-time jobs and will hold off from one of them that demands a lot of walking. - Referred to podiatry, debridement?  likely need therapeutic shoes - Cephalexin - Self Epsom salts with water at room temperature then tap the area dry - This was extensively discussed with  the patient, she is somewhat disappointed by this complication however I praised her for excellent A1c ; fortunately she does not have peripheral vascular disease on exam. RTC 1 month with PCP Appreciated the help of the interpreter.

## 2023-07-28 NOTE — Patient Instructions (Addendum)
 Avoid putting pressure on the feet, avoid excessive walking.  Submerge your foot on room temperature water with Epsom salts once or twice a day, then pat it dry  Take an antibiotic for 1 week  We are referring you to podiatrist for further evaluation and treatment. They may consider a therapeutic diabetic shoe  We could consider a wound care referral  Call if not gradually better.  You do have neuropathy, recommend appropriate feet care, see the information below.  Please  arrange a visit with your primary doctor in 4 weeks      Diabetes Mellitus and Foot Care Diabetes, also called diabetes mellitus, may cause problems with your feet and legs because of poor blood flow (circulation). Poor circulation may make your skin: Become thinner and drier. Break more easily. Heal more slowly. Peel and crack. You may also have nerve damage (neuropathy). This can cause decreased feeling in your legs and feet. This means that you may not notice minor injuries to your feet that could lead to more serious problems. Finding and treating problems early is the best way to prevent future foot problems. How to care for your feet Foot hygiene  Wash your feet daily with warm water and mild soap. Do not use hot water. Then, pat your feet and the areas between your toes until they are fully dry. Do not soak your feet. This can dry your skin. Trim your toenails straight across. Do not dig under them or around the cuticle. File the edges of your nails with an emery board or nail file. Apply a moisturizing lotion or petroleum jelly to the skin on your feet and to dry, brittle toenails. Use lotion that does not contain alcohol and is unscented. Do not apply lotion between your toes. Shoes and socks Wear clean socks or stockings every day. Make sure they are not too tight. Do not wear knee-high stockings. These may decrease blood flow to your legs. Wear shoes that fit well and have enough cushioning. Always  look in your shoes before you put them on to be sure there are no objects inside. To break in new shoes, wear them for just a few hours a day. This prevents injuries on your feet. Wounds, scrapes, corns, and calluses  Check your feet daily for blisters, cuts, bruises, sores, and redness. If you cannot see the bottom of your feet, use a mirror or ask someone for help. Do not cut off corns or calluses or try to remove them with medicine. If you find a minor scrape, cut, or break in the skin on your feet, keep it and the skin around it clean and dry. You may clean these areas with mild soap and water. Do not clean the area with peroxide, alcohol, or iodine. If you have a wound, scrape, corn, or callus on your foot, look at it several times a day to make sure it is healing and not infected. Check for: Redness, swelling, or pain. Fluid or blood. Warmth. Pus or a bad smell. General tips Do not cross your legs. This may decrease blood flow to your feet. Do not use heating pads or hot water bottles on your feet. They may burn your skin. If you have lost feeling in your feet or legs, you may not know this is happening until it is too late. Protect your feet from hot and cold by wearing shoes, such as at the beach or on hot pavement. Schedule a complete foot exam at least once a  year or more often if you have foot problems. Report any cuts, sores, or bruises to your health care provider right away. Where to find more information American Diabetes Association: diabetes.org Association of Diabetes Care & Education Specialists: diabeteseducator.org Contact a health care provider if: You have a condition that increases your risk of infection, and you have any cuts, sores, or bruises on your feet. You have an injury that is not healing. You have redness on your legs or feet. You feel burning or tingling in your legs or feet. You have pain or cramps in your legs and feet. Your legs or feet are numb. Your  feet always feel cold. You have pain around any toenails. Get help right away if: You have a wound, scrape, corn, or callus on your foot and: You have signs of infection. You have a fever. You have a red line going up your leg. This information is not intended to replace advice given to you by your health care provider. Make sure you discuss any questions you have with your health care provider. Document Revised: 09/22/2021 Document Reviewed: 09/22/2021 Elsevier Patient Education  2024 [employer].

## 2023-08-01 DIAGNOSIS — M542 Cervicalgia: Secondary | ICD-10-CM | POA: Diagnosis not present

## 2023-08-04 DIAGNOSIS — M542 Cervicalgia: Secondary | ICD-10-CM | POA: Diagnosis not present

## 2023-08-07 ENCOUNTER — Ambulatory Visit: Admitting: Podiatry

## 2023-08-07 ENCOUNTER — Encounter: Payer: Self-pay | Admitting: Podiatry

## 2023-08-07 VITALS — Ht 66.0 in | Wt 169.4 lb

## 2023-08-07 DIAGNOSIS — L97511 Non-pressure chronic ulcer of other part of right foot limited to breakdown of skin: Secondary | ICD-10-CM

## 2023-08-07 MED ORDER — MUPIROCIN 2 % EX OINT: 1.0000 | TOPICAL_OINTMENT | Freq: Two times a day (BID) | CUTANEOUS | 2 refills | Status: DC

## 2023-08-07 NOTE — Patient Instructions (Signed)
  VISIT SUMMARY: Today, you were seen for a healing ulcer on your right fourth toe. You have been managing your diabetes well, and your recent A1c is 6.1. The ulcer is healing, and there are no signs of active infection. We discussed the importance of proper footwear and maintaining good glycemic control to prevent future issues.  YOUR PLAN: -DIABETIC FOOT ULCER: A diabetic foot ulcer is an open sore or wound that occurs in people with diabetes, often due to poor circulation or nerve damage. Your ulcer is healing well with no signs of infection. You should apply mupirocin ointment to the ulcer until it heals and wear shoes with a wide toe box to reduce friction. Monitor for signs of infection and return if the ulcer does not heal by Memorial Day. We have also refilled your mupirocin ointment for future use if similar issues arise.  -TYPE 2 DIABETES MELLITUS: Type 2 diabetes mellitus is a condition where your body does not use insulin properly, leading to high blood sugar levels. Your diabetes is well-controlled with an A1c of 6.1%. Continue your current diabetes management to maintain good glycemic control and prevent complications.  INSTRUCTIONS: Please apply mupirocin ointment to your ulcer until it heals and wear shoes with a wide toe box to reduce friction. Monitor for signs of infection and return if the ulcer does not heal by Memorial Day. Continue your current diabetes management to maintain good glycemic control.                      Contains text generated by [employer].                                 Contains text generated by [employer].

## 2023-08-08 DIAGNOSIS — M542 Cervicalgia: Secondary | ICD-10-CM | POA: Diagnosis not present

## 2023-08-10 NOTE — Progress Notes (Signed)
 Subjective:  Patient ID: Claudia Gregory, female    DOB: 08/03/1960,  MRN: [id number]  Chief Complaint  Patient presents with   Diabetic Ulcer    Pt is here due to ulcer on the back of her 4th right toe, states she notice it over a week ago, seen PCP for this issue and was told it was an diabetic ulcer and was put on antibiotics.    Discussed the use of AI scribe software for clinical note transcription with the patient, who gave verbal consent to proceed.  History of Present Illness Claudia Gregory is a 63 year old female with diabetes who presents with a healing ulcer on her right fourth toe.  Approximately two weeks ago, she noticed a pink discoloration on the top of her right fourth toe, which was initially thought to be bleeding. The toe would bleed when she wore shoes and was painful to touch. She documented the condition with a photograph and sent it to her primary doctor.  She has been managing her diabetes well, with a recent A1c of 6.1, and is on insulin therapy. She completed a course of oral antibiotics and reports no current bleeding or drainage from the ulcer. No numbness or cold sensation in her toes, indicating no significant neuropathy.  She stands and walks a lot at work, which may contribute to the issue. She typically wears shoes with a soft insole, but they are wearing down, potentially causing her foot to shift and rub, leading to friction on the toe. Her fourth toe curls under, causing friction with the third toe, especially when her foot swells during walking.      Objective:    Physical Exam VASCULAR: DP and PT pulse palpable. Foot is warm and well-perfused. Capillary fill time is brisk. DERMATOLOGIC: Partial thickness, 3mm skin ulcer on plantar medial right fourth toe healing well. No signs of active infection. Normal skin turgor, texture, and temperature. No open lesions, rashes, or ulcerations. NEUROLOGIC: Normal sensation to light touch and pressure. No  paresthesias on examination. ORTHOPEDIC: Smooth pain-free range of motion of all examined joints. No ecchymosis or bruising. No gross deformity. No pain to palpation.   No images are attached to the encounter.    Results LABS Hemoglobin A1c: 6.1%   Assessment:   1. Skin ulcer of fourth toe, right, limited to breakdown of skin [facility])      Plan:  Patient was evaluated and treated and all questions answered.  Assessment and Plan Assessment & Plan Diabetic foot ulcer Partial thickness, 3 mm skin ulcer on the plantar medial right fourth toe, healing well. No signs of active infection. Likely caused by friction due to shoe wear and foot movement. Good glycemic control with A1c at 6.1% has likely contributed to healing. No current bleeding or drainage. Antibiotic course completed. Mupirocin ointment prescribed to aid healing and prevent infection. Emphasized importance of proper footwear to prevent recurrence. - Prescribe mupirocin ointment to apply until ulcer heals. - Advise on proper footwear with a wide toe box to reduce friction and prevent recurrence. - Instruct to monitor for signs of infection and return if ulcer does not heal by Memorial Day. - Refill mupirocin ointment for future use if similar issues arise.  Type 2 diabetes mellitus Type 2 diabetes mellitus with good glycemic control, A1c at 6.1%. No neuropathy reported, but decreased sensation in toes noted, which may contribute to ulcer formation. Emphasized the importance of maintaining good glycemic control to prevent complications such as infections  and potential amputations. - Continue current diabetes management to maintain good glycemic control.      Return if symptoms worsen or fail to improve.

## 2023-08-19 ENCOUNTER — Other Ambulatory Visit: Payer: Self-pay | Admitting: Family Medicine

## 2023-08-30 ENCOUNTER — Ambulatory Visit: Admitting: Internal Medicine

## 2023-09-05 NOTE — Progress Notes (Unsigned)
 [facility] at [facility] [address] [place], [place] [address] [phone]  Date:  09/06/2023   Name:  Claudia Gregory   DOB:  14-Feb-1961   MRN:  [id number]  PCP:  Kaylee Partridge, MD    Chief Complaint: No chief complaint on file.   History of Present Illness:  Claudia Gregory is a 63 y.o. very pleasant female patient who presents with the following:  Patient seen today with concern of abdominal discomfort Most recent visit with myself was in [place] that time she was doing well with Mounjaro , she was experiencing weight loss and A1c 6.2% History of deafness, hypertension, GERD, hyperlipidemia, obesity, peripheral neuropathy, diabetes, fatty liver with elevation of LFT   Metformin , Farxiga , Mounjaro  2.5 Crestor  Zoloft  Losartan /hydrochlorothiazide   Eye exam Can give dose of Prevnar 20  Patient Active Problem List   Diagnosis Date Noted   Gross hematuria 04/28/2022   Dysuria 04/28/2022   Controlled type 2 diabetes mellitus without complication, without long-term current use of insulin (HCC) 07/11/2019   Generalized anxiety disorder 12/28/2015   Elevated liver enzymes 12/28/2015   Plantar fasciitis of right foot 04/01/2015   Neuropathic pain of both feet 10/27/2014   Hx of adenomatous polyp of colon 08/08/2014   Deafness 08/04/2014   GERD (gastroesophageal reflux disease) 05/13/2014   Hyperlipidemia 02/05/2013   Hot flashes 11/24/2012   OBESITY 10/23/2008   Hypertension 10/23/2008   BIPOLAR DISORDER UNSPECIFIED 09/02/2008    Past Medical History:  Diagnosis Date   Anxiety    Bipolar 1 disorder (HCC)    Carpal tunnel syndrome of left wrist    Chest pain, atypical    Deaf    Depression    Diabetes mellitus without complication (HCC)    past hx- off meds    FATIGUE    Gallstones    GERD (gastroesophageal reflux disease)    Hx of adenomatous polyp of colon 08/08/2014   Hyperlipidemia    Hypertension     OBESITY    Pneumonia     Past Surgical History:  Procedure Laterality Date   CHOLECYSTECTOMY  2000   COLONOSCOPY  2016   5 mm adenoma   POLYPECTOMY     TUBAL LIGATION  1998   UPPER GASTROINTESTINAL ENDOSCOPY      Social History   Tobacco Use   Smoking status: Never   Smokeless tobacco: Never  Vaping Use   Vaping status: Never Used  Substance Use Topics   Alcohol use: Yes    Alcohol/week: 2.0 standard drinks of alcohol    Types: 2 Glasses of wine per week    Comment: rare glass of wine    Drug use: No    Family History  Problem Relation Age of Onset   Brain cancer Mother    Hypertension Mother    Diabetes Mother    Diabetes Maternal Aunt    Heart attack Maternal Grandmother    Hypertension Maternal Grandmother    Colon cancer Neg Hx    Rectal cancer Neg Hx    Stomach cancer Neg Hx    Colon polyps Neg Hx    Esophageal cancer Neg Hx     No Known Allergies  Medication list has been reviewed and updated.  Current Outpatient Medications on File Prior to Visit  Medication Sig Dispense Refill   cephALEXin  (KEFLEX ) 500 MG capsule Take 1 capsule (500 mg total) by mouth 4 (four) times daily. 28 capsule 0  FARXIGA  10 MG TABS tablet TAKE 1 TABLET BY MOUTH DAILY BEFORE BREAKFAST. 90 tablet 3   losartan -hydrochlorothiazide  (HYZAAR) 50-12.5 MG tablet TAKE 1 TABLET BY MOUTH EVERY DAY 90 tablet 1   metFORMIN  (GLUCOPHAGE ) 500 MG tablet TAKE 2 TABLETS BY MOUTH EVERY MORNING AND 1 TAB AT PM 270 tablet 3   mupirocin  ointment (BACTROBAN ) 2 % Apply 1 Application topically 2 (two) times daily. 30 g 2   rosuvastatin  (CRESTOR ) 20 MG tablet TAKE 1 TABLET BY MOUTH EVERY DAY 90 tablet 3   sertraline  (ZOLOFT ) 100 MG tablet Take 1 tablet (100 mg total) by mouth at bedtime. 135 tablet 3   tirzepatide  (MOUNJARO ) 2.5 MG/0.5ML Pen Inject 2.5 mg into the skin once a week. 6 mL 0   No current facility-administered medications on file prior to visit.    Review of Systems:  As per HPI-  otherwise negative.   Physical Examination: There were no vitals filed for this visit. There were no vitals filed for this visit. There is no height or weight on file to calculate BMI. Ideal Body Weight:    GEN: no acute distress. HEENT: Atraumatic, Normocephalic.  Ears and Nose: No external deformity. CV: RRR, No M/G/R. No JVD. No thrill. No extra heart sounds. PULM: CTA B, no wheezes, crackles, rhonchi. No retractions. No resp. distress. No accessory muscle use. ABD: S, NT, ND, +BS. No rebound. No HSM. EXTR: No c/c/e PSYCH: Normally interactive. Conversant.    Assessment and Plan: ***  Signed Gates Kasal, MD

## 2023-09-05 NOTE — Patient Instructions (Incomplete)
 It was good to see you again today I will be in touch with your labs asap I will get you set up for a CT scan of your abdomen and pelvis to help us  make sure there is noting of concern going on  Please let me know if anything is changing or getting worse in the meantime

## 2023-09-06 ENCOUNTER — Ambulatory Visit: Admitting: Family Medicine

## 2023-09-06 VITALS — BP 133/69 | HR 59 | Temp 98.7°F | Resp 16 | Ht 66.0 in | Wt 169.0 lb

## 2023-09-06 DIAGNOSIS — R1032 Left lower quadrant pain: Secondary | ICD-10-CM | POA: Diagnosis not present

## 2023-09-06 DIAGNOSIS — E119 Type 2 diabetes mellitus without complications: Secondary | ICD-10-CM

## 2023-09-06 DIAGNOSIS — Z7984 Long term (current) use of oral hypoglycemic drugs: Secondary | ICD-10-CM | POA: Diagnosis not present

## 2023-09-06 DIAGNOSIS — Z7985 Long-term (current) use of injectable non-insulin antidiabetic drugs: Secondary | ICD-10-CM | POA: Diagnosis not present

## 2023-09-06 LAB — POCT URINALYSIS DIP (MANUAL ENTRY)
Blood, UA: NEGATIVE
Glucose, UA: NEGATIVE mg/dL
Ketones, POC UA: NEGATIVE mg/dL
Leukocytes, UA: NEGATIVE
Nitrite, UA: NEGATIVE
Protein Ur, POC: NEGATIVE mg/dL
Spec Grav, UA: 1.02 (ref 1.010–1.025)
Urobilinogen, UA: 0.2 U/dL
pH, UA: 5 (ref 5.0–8.0)

## 2023-09-07 ENCOUNTER — Encounter: Payer: Self-pay | Admitting: Family Medicine

## 2023-09-07 LAB — COMPREHENSIVE METABOLIC PANEL WITH GFR
ALT: 30 U/L (ref 0–35)
AST: 25 U/L (ref 0–37)
Albumin: 5.1 g/dL (ref 3.5–5.2)
Alkaline Phosphatase: 60 U/L (ref 39–117)
BUN: 12 mg/dL (ref 6–23)
CO2: 27 meq/L (ref 19–32)
Calcium: 10.3 mg/dL (ref 8.4–10.5)
Chloride: 99 meq/L (ref 96–112)
Creatinine, Ser: 0.59 mg/dL (ref 0.40–1.20)
GFR: 97.88 mL/min (ref >60.00)
Glucose, Bld: 97 mg/dL (ref 70–99)
Potassium: 3.6 meq/L (ref 3.5–5.1)
Sodium: 138 meq/L (ref 135–145)
Total Bilirubin: 0.6 mg/dL (ref 0.2–1.2)
Total Protein: 7.7 g/dL (ref 6.0–8.3)

## 2023-09-07 LAB — LIPASE: Lipase: 73 U/L — ABNORMAL HIGH (ref 11.0–59.0)

## 2023-09-07 LAB — HEMOGLOBIN A1C: Hgb A1c MFr Bld: 6.1 % (ref 4.6–6.5)

## 2023-09-07 LAB — URINE CULTURE
MICRO NUMBER:: 16538257
Result:: NO GROWTH
SPECIMEN QUALITY:: ADEQUATE

## 2023-09-07 LAB — CBC
HCT: 41 % (ref 36.0–46.0)
Hemoglobin: 14.1 g/dL (ref 12.0–15.0)
MCHC: 34.5 g/dL (ref 30.0–36.0)
MCV: 84.8 fl (ref 78.0–100.0)
Platelets: 245 10*3/uL (ref 150.0–400.0)
RBC: 4.84 Mil/uL (ref 3.87–5.11)
RDW: 13.2 % (ref 11.5–15.5)
WBC: 7.8 10*3/uL (ref 4.0–10.5)

## 2023-09-08 ENCOUNTER — Encounter: Payer: Self-pay | Admitting: Family Medicine

## 2023-09-14 ENCOUNTER — Ambulatory Visit (HOSPITAL_BASED_OUTPATIENT_CLINIC_OR_DEPARTMENT_OTHER)
Admission: RE | Admit: 2023-09-14 | Discharge: 2023-09-14 | Disposition: A | Discharge status: Home / Self Care | Source: Ambulatory Visit | Attending: Family Medicine | Admitting: Family Medicine

## 2023-09-14 DIAGNOSIS — R1032 Left lower quadrant pain: Secondary | ICD-10-CM | POA: Diagnosis not present

## 2023-09-14 DIAGNOSIS — K5730 Diverticulosis of large intestine without perforation or abscess without bleeding: Secondary | ICD-10-CM | POA: Diagnosis not present

## 2023-09-14 DIAGNOSIS — D259 Leiomyoma of uterus, unspecified: Secondary | ICD-10-CM | POA: Diagnosis not present

## 2023-09-14 MED ORDER — IOHEXOL 300 MG/ML  SOLN
100.0000 mL | Freq: Once | INTRAMUSCULAR | Status: AC | PRN
  Administered 2023-09-14: 100 mL via INTRAVENOUS

## 2023-09-15 ENCOUNTER — Other Ambulatory Visit: Payer: Self-pay | Admitting: Family Medicine

## 2023-09-15 DIAGNOSIS — E119 Type 2 diabetes mellitus without complications: Secondary | ICD-10-CM

## 2023-09-15 MED ORDER — TIRZEPATIDE 5 MG/0.5ML ~~LOC~~ SOAJ: 5.0000 mg | SUBCUTANEOUS | 1 refills | Status: DC

## 2023-09-17 ENCOUNTER — Encounter: Payer: Self-pay | Admitting: Family Medicine

## 2023-10-23 DIAGNOSIS — M5412 Radiculopathy, cervical region: Secondary | ICD-10-CM | POA: Diagnosis not present

## 2023-10-31 DIAGNOSIS — M5412 Radiculopathy, cervical region: Secondary | ICD-10-CM | POA: Diagnosis not present

## 2023-11-14 DIAGNOSIS — M5412 Radiculopathy, cervical region: Secondary | ICD-10-CM | POA: Diagnosis not present

## 2023-11-19 ENCOUNTER — Other Ambulatory Visit: Payer: Self-pay | Admitting: Family Medicine

## 2023-11-19 DIAGNOSIS — E785 Hyperlipidemia, unspecified: Secondary | ICD-10-CM

## 2023-11-24 ENCOUNTER — Other Ambulatory Visit: Payer: Self-pay | Admitting: Family Medicine

## 2023-11-24 DIAGNOSIS — E119 Type 2 diabetes mellitus without complications: Secondary | ICD-10-CM

## 2023-12-14 ENCOUNTER — Telehealth: Payer: Self-pay

## 2023-12-14 DIAGNOSIS — E119 Type 2 diabetes mellitus without complications: Secondary | ICD-10-CM

## 2023-12-14 NOTE — Telephone Encounter (Signed)
 Claudia Gregory   12/14/2023  4:49 PM  Patient is calling in due to not hearing back from the nurse regarding the medication. Patient missed her pick up and the medication was put back and the pharmacy stated they will not have it ready until 09/13. Patient states she takes her shot every Thursday and is concerned about missing the injection today. Please advise the patient.

## 2023-12-14 NOTE — Telephone Encounter (Signed)
 Copied from CRM [id number]. Topic: Clinical - Medication Question >> Dec 14, 2023 12:39 PM Jasmin G wrote: Reason for CRM: Pt requested a call back from one of her PCP, Dr. Eleanore nurses to discuss her nurse tirzepatide  (MOUNJARO ) 5 MG/0.5ML Pen prescription. Call pt at [phone].

## 2023-12-21 DIAGNOSIS — M5030 Other cervical disc degeneration, unspecified cervical region: Secondary | ICD-10-CM | POA: Insufficient documentation

## 2023-12-21 DIAGNOSIS — M542 Cervicalgia: Secondary | ICD-10-CM | POA: Diagnosis not present

## 2023-12-30 ENCOUNTER — Encounter: Payer: Self-pay | Admitting: Family Medicine

## 2024-01-01 ENCOUNTER — Ambulatory Visit: Admitting: Family

## 2024-01-01 ENCOUNTER — Ambulatory Visit: Admitting: Family Medicine

## 2024-01-01 VITALS — BP 130/78 | HR 63 | Temp 97.2°F | Ht 66.0 in | Wt 167.0 lb

## 2024-01-01 DIAGNOSIS — H9202 Otalgia, left ear: Secondary | ICD-10-CM | POA: Diagnosis not present

## 2024-01-01 DIAGNOSIS — R590 Localized enlarged lymph nodes: Secondary | ICD-10-CM

## 2024-01-01 MED ORDER — AMOXICILLIN-POT CLAVULANATE 875-125 MG PO TABS: 1.0000 | ORAL_TABLET | Freq: Two times a day (BID) | ORAL | 0 refills | Status: DC

## 2024-01-01 NOTE — Progress Notes (Unsigned)
 Patient ID: Claudia Gregory, female    DOB: [birth year], 63 y.o.   MRN: [id number]  Chief Complaint  Patient presents with  . Ear Pain    Pt c/o left sided neck and ear pain, Present since Friday. Has tried soup, tylenol  and advil, which did help slightly.   Discussed the use of AI scribe software for clinical note transcription with the patient, who gave verbal consent to proceed.  History of Present Illness   Claudia Gregory is a 63 year old female who presents with ear and jaw pain.  She has experienced ear pain for three days, starting on Friday, with pain extending to the area under the ear. The pain is severe, initially making swallowing difficult, but she has shown slight improvement, now able to eat some solid foods. Over-the-counter ear drops, believed to contain alcohol, have been used for pain management.  Jaw soreness is present, with tenderness to touch, but it does not worsen with chewing or jaw movement. There is no history of similar pain or trauma to the area. A heating pad has been used for relief.  She took two Aleve  tablets two hours before the visit, providing some relief, and has been taking Aleve  twice daily since symptom onset. No fever, sinus symptoms, or other related symptoms are present. She perceives a slight change in the affected area's size.     Assessment and Plan    Left jaw and periauricular pain and swelling, possible infection Severe pain affecting swallowing and jaw movement, slightly improved with naproxen  and heating pad. Possible infection considered due to pain and swelling. Differential includes dental issues. - Prescribed Augmentin  twice daily for 5 days. - Continue naproxen , two pills twice daily or one pill four times daily, not to exceed this dosage. - Allow additional two naproxen  at bedtime for pain relief tonight only. - Recommend acetaminophen  with naproxen  for additional pain relief. - Instructed to call if symptoms do not improve;  consider imaging if no improvement. - Sent prescription to [facility] in [place] on [address].     Subjective:    Outpatient Medications Prior to Visit  Medication Sig Dispense Refill  . losartan -hydrochlorothiazide  (HYZAAR) 50-12.5 MG tablet TAKE 1 TABLET BY MOUTH EVERY DAY 90 tablet 1  . metFORMIN  (GLUCOPHAGE ) 500 MG tablet Take 2 tablets (1,000 mg total) by mouth in the morning AND 1 tablet (500 mg total) every evening. 270 tablet 0  . rosuvastatin  (CRESTOR ) 20 MG tablet TAKE 1 TABLET BY MOUTH EVERY DAY 90 tablet 3  . sertraline  (ZOLOFT ) 100 MG tablet Take 1 tablet (100 mg total) by mouth at bedtime. 135 tablet 3  . tirzepatide  (MOUNJARO ) 5 MG/0.5ML Pen Inject 5 mg into the skin once a week. 6 mL 1  . mupirocin  ointment (BACTROBAN ) 2 % Apply 1 Application topically 2 (two) times daily. 30 g 2   No facility-administered medications prior to visit.   Past Medical History:  Diagnosis Date  . Anxiety   . Bipolar 1 disorder (HCC)   . Carpal tunnel syndrome of left wrist   . Cervical radiculopathy 10/31/2023  . Chest pain, atypical   . DDD (degenerative disc disease), cervical 12/21/2023  . Deaf   . Depression   . Diabetes mellitus without complication (HCC)    past hx- off meds   . FATIGUE   . Gallstones   . GERD (gastroesophageal reflux disease)   . Hx of adenomatous polyp of colon 08/08/2014  . Hyperlipidemia   . Hypertension   .  OBESITY   . Pneumonia    Past Surgical History:  Procedure Laterality Date  . CHOLECYSTECTOMY  2000  . COLONOSCOPY  2016   5 mm adenoma  . POLYPECTOMY    . TUBAL LIGATION  1998  . UPPER GASTROINTESTINAL ENDOSCOPY     No Known Allergies    Objective:    Physical Exam Vitals and nursing note reviewed.  Constitutional:      Appearance: Normal appearance.  Cardiovascular:     Rate and Rhythm: Normal rate and regular rhythm.  Pulmonary:     Effort: Pulmonary effort is normal.     Breath sounds: Normal breath sounds.  Musculoskeletal:         General: Normal range of motion.  Skin:    General: Skin is warm and dry.  Neurological:     Mental Status: She is alert.  Psychiatric:        Mood and Affect: Mood normal.        Behavior: Behavior normal.    BP 130/78 (BP Location: Left Arm, Patient Position: Sitting, Cuff Size: Normal)   Pulse 63   Temp (!) 97.2 F (36.2 C) (Temporal)   Ht 5' 6 (1.676 m)   Wt 167 lb (75.8 kg)   LMP 10/06/2015 (Approximate)   SpO2 97%   BMI 26.95 kg/m  Wt Readings from Last 3 Encounters:  01/01/24 167 lb (75.8 kg)  09/06/23 169 lb (76.7 kg)  08/07/23 169 lb 6.1 oz (76.8 kg)      Lucius Krabbe, NP

## 2024-01-10 DIAGNOSIS — M542 Cervicalgia: Secondary | ICD-10-CM | POA: Diagnosis not present

## 2024-01-13 NOTE — Patient Instructions (Incomplete)
 It was great to see you today, I will be in touch with your labs.  Assuming all is well please see me in about 6 months We can reduce your mounjaro  to 2.5 assuming your A1c is ok  I will set you up for a 2nd opinion regarding your possible spine surgery to help with this tough decision

## 2024-01-13 NOTE — Progress Notes (Addendum)
 [facility] at [facility] [address] [place], [place] [address] [phone]  Date:  01/18/2024   Name:  Claudia Gregory   DOB:  1960-06-09   MRN:  [id number]  PCP:  Watt Harlene BROCKS, MD    Chief Complaint: Annual Exam   History of Present Illness:  Claudia Gregory is a 63 y.o. very pleasant female patient who presents with the following:  Patient seen today for physical exam.  I saw her most recently in June when she was having some abdominal pain We got a CT at that time which was normal History of deafness, hypertension, GERD, hyperlipidemia, obesity, peripheral neuropathy, diabetes, fatty liver with elevation of LFT  Visit today accomplished with aid of in person ASL interpreter  Metformin , Farxiga , Mounjaro  5 mg Crestor  Zoloft  Losartan /hydrochlorothiazide    - Due for Prevnar 20 - Eye exam- reminded her  - Due for urine micro -can update mammogram Pap 2023, normal-she did have ASCUS in 2022-5-year follow-up recommended in this case Colonoscopy 2021 Flu and COVID are both up to date She has completed Shingrix  We got blood work in June, CMP, CBC, A1c 6.1% Her lipase was minimally elevated at that time  Wt Readings from Last 3 Encounters:  01/18/24 168 lb 3.2 oz (76.3 kg)  01/01/24 167 lb (75.8 kg)  09/06/23 169 lb (76.7 kg)   Lab Results  Component Value Date   HGBA1C 6.1 09/06/2023     Discussed the use of AI scribe software for clinical note transcription with the patient, who gave verbal consent to proceed.  History of Present Illness Claudia Gregory is a 63 year old female who presents with abdominal cramping after Mounjaro  injections. She is accompanied by her husband.  She experiences abdominal cramping approximately three hours after administering her weekly Mounjaro  injection. She is currently on a dose of 5 mg, which she increased from 2.5 mg. The cramping began after the dosage increase and did  not occur at the lower dose.  She has a history of diabetes and is interested in monitoring her blood sugar levels. Her last A1c check was in June, and it was within a good range.  She recently visited a spine surgeon for neck pain and was diagnosed with herniated discs. Previous treatments, including physical therapy, dry needling, and steroids, were ineffective and sometimes worsened her symptoms. She is considering surgery but is hesitant due to the risks and success rate.  It sounds like her spine surgeon is recommending a three-level spinal fusion with disc implants.  Nitika feels confident in her surgeon but knows this is a big operation with variable success rates.  She is not sure if she should pursue this operation.  She would like to seek a second opinion to make sure another surgeon is in agreement with this treatment plan.  I reassured her it is always okay to seek a second opinion when considering a major operation  She canceled her eye appointments twice this year and acknowledges the need for new glasses. She has received both her flu and COVID vaccinations in early August of this year.  She engages in regular physical activity, specifically walking during her break times at work, and reports no issues with chest pain or difficulty breathing during exercise.   Patient Active Problem List   Diagnosis Date Noted   Gross hematuria 04/28/2022   Dysuria 04/28/2022   Controlled type 2 diabetes mellitus without complication, without long-term  current use of insulin (HCC) 07/11/2019   Generalized anxiety disorder 12/28/2015   Elevated liver enzymes 12/28/2015   Plantar fasciitis of right foot 04/01/2015   Neuropathic pain of both feet 10/27/2014   Hx of adenomatous polyp of colon 08/08/2014   Deafness 08/04/2014   GERD (gastroesophageal reflux disease) 05/13/2014   Hyperlipidemia 02/05/2013   Hot flashes 11/24/2012   OBESITY 10/23/2008   Hypertension 10/23/2008   BIPOLAR DISORDER  UNSPECIFIED 09/02/2008    Past Medical History:  Diagnosis Date   Anxiety    Arthritis    Bipolar 1 disorder (HCC)    Carpal tunnel syndrome of left wrist    Cervical radiculopathy 10/31/2023   Chest pain, atypical    DDD (degenerative disc disease), cervical 12/21/2023   Deaf    Depression    Diabetes mellitus without complication (HCC)    past hx- off meds    FATIGUE    Gallstones    GERD (gastroesophageal reflux disease)    Hx of adenomatous polyp of colon 08/08/2014   Hyperlipidemia    Hypertension    OBESITY    Pneumonia     Past Surgical History:  Procedure Laterality Date   CHOLECYSTECTOMY  2000   COLONOSCOPY  2016   5 mm adenoma   POLYPECTOMY     TUBAL LIGATION  1998   UPPER GASTROINTESTINAL ENDOSCOPY      Social History   Tobacco Use   Smoking status: Never   Smokeless tobacco: Never  Vaping Use   Vaping status: Never Used  Substance Use Topics   Alcohol use: Not Currently    Alcohol/week: 2.0 standard drinks of alcohol    Types: 2 Glasses of wine per week    Comment: rare glass of wine    Drug use: No    Family History  Problem Relation Age of Onset   Brain cancer Mother    Hypertension Mother    Diabetes Mother    Anxiety disorder Mother    Arthritis Mother    Cancer Mother    Diabetes Father    Diabetes Maternal Aunt    Anxiety disorder Maternal Aunt    Heart attack Maternal Grandmother    Hypertension Maternal Grandmother    Colon cancer Neg Hx    Rectal cancer Neg Hx    Stomach cancer Neg Hx    Colon polyps Neg Hx    Esophageal cancer Neg Hx     No Known Allergies  Medication list has been reviewed and updated.  Current Outpatient Medications on File Prior to Visit  Medication Sig Dispense Refill   rosuvastatin  (CRESTOR ) 20 MG tablet TAKE 1 TABLET BY MOUTH EVERY DAY 90 tablet 3   sertraline  (ZOLOFT ) 100 MG tablet Take 1 tablet (100 mg total) by mouth at bedtime. 135 tablet 3   tirzepatide  (MOUNJARO ) 5 MG/0.5ML Pen Inject 5  mg into the skin once a week. 6 mL 1   No current facility-administered medications on file prior to visit.    Review of Systems:  As per HPI- otherwise negative.   Physical Examination: Vitals:   01/18/24 1436  BP: 116/74  Pulse: (!) 55  Temp: 98.7 F (37.1 C)  SpO2: 99%   Vitals:   01/18/24 1436  Weight: 168 lb 3.2 oz (76.3 kg)  Height: 5' 6 (1.676 m)   Body mass index is 27.15 kg/m. Ideal Body Weight: Weight in (lb) to have BMI = 25: 154.6  GEN: no acute distress.  Minimal overweight, looks well  HEENT: Atraumatic, Normocephalic.  Bilateral TM wnl, oropharynx normal.  PEERL,EOMI.   Ears and Nose: No external deformity. CV: RRR, No M/G/R. No JVD. No thrill. No extra heart sounds. PULM: CTA B, no wheezes, crackles, rhonchi. No retractions. No resp. distress. No accessory muscle use. ABD: S, NT, ND, +BS. No rebound. No HSM. EXTR: No c/c/e PSYCH: Normally interactive. Conversant.    Assessment and Plan: Physical exam  Hyperlipidemia, unspecified hyperlipidemia type - Plan: Lipid panel  Controlled type 2 diabetes mellitus without complication, without long-term current use of insulin (HCC) - Plan: Hemoglobin A1c, metFORMIN  (GLUCOPHAGE ) 500 MG tablet, Microalbumin / creatinine urine ratio  Essential hypertension - Plan: losartan -hydrochlorothiazide  (HYZAAR) 50-12.5 MG tablet  Screening for thyroid  disorder - Plan: TSH  Elevated lipase - Plan: Lipase  Encounter for screening mammogram for malignant neoplasm of breast - Plan: MM 3D SCREENING MAMMOGRAM BILATERAL BREAST  Cervical spine disease - Plan: Ambulatory referral to Neurosurgery  Assessment & Plan Cervical disc herniation with pain Persistent pain despite previous treatments. Surgery recommended by spine surgeon, patient educated by surgeon that procedure has 65-70% success rate.  Due to gravity of operation she would like to consult with a second surgeon to make sure they are of the same opinion - Arrange  second opinion with another spine surgeon. - Advise to obtain a copy of MRI from orthopedics office.  Type 2 diabetes mellitus Managed with Mounjaro . Experiencing stomach cramping post-dose. Considering dose reduction if A1c stable. - Check A1c. - Plan to reduce Mounjaro  to 2.5 mg if A1c is stable.  She does have several weeks of Mounjaro  5 on hand and plans to finish this up - Check urine for protein.  Essential hypertension Blood pressure well-controlled.  Hyperlipidemia No specific discussion during this visit.  Adult Wellness Visit Routine visit. Blood pressure well-controlled. Regular exercise without issues. - Order mammogram. - Refill medications as needed.  Signed Harlene Schroeder, MD  Addendum 10/17, received labs as below.  Message to patient  Results for orders placed or performed in visit on 01/18/24  Lipase   Collection Time: 01/18/24  3:27 PM  Result Value Ref Range   Lipase 58.0 11.0 - 59.0 U/L  Lipid panel   Collection Time: 01/18/24  3:27 PM  Result Value Ref Range   Cholesterol 161 0 - 200 mg/dL   Triglycerides 890.9 0.0 - 149.0 mg/dL   HDL 37.19 >60.99 mg/dL   VLDL 78.1 0.0 - 59.9 mg/dL   LDL Cholesterol 77 0 - 99 mg/dL   Total CHOL/HDL Ratio 3    NonHDL 98.40   TSH   Collection Time: 01/18/24  3:27 PM  Result Value Ref Range   TSH 2.41 0.35 - 5.50 uIU/mL  Hemoglobin A1c   Collection Time: 01/18/24  3:27 PM  Result Value Ref Range   Hgb A1c MFr Bld 6.0 4.6 - 6.5 %  Microalbumin / creatinine urine ratio   Collection Time: 01/18/24  3:27 PM  Result Value Ref Range   Microalb, Ur <0.7 mg/dL   Creatinine,U 21.0 mg/dL   Microalb Creat Ratio Unable to calculate 0.0 - 30.0 mg/g

## 2024-01-15 ENCOUNTER — Other Ambulatory Visit (HOSPITAL_COMMUNITY): Payer: Self-pay

## 2024-01-18 ENCOUNTER — Ambulatory Visit (INDEPENDENT_AMBULATORY_CARE_PROVIDER_SITE_OTHER): Admitting: Family Medicine

## 2024-01-18 VITALS — BP 116/74 | HR 55 | Temp 98.7°F | Ht 66.0 in | Wt 168.2 lb

## 2024-01-18 DIAGNOSIS — Z0000 Encounter for general adult medical examination without abnormal findings: Secondary | ICD-10-CM

## 2024-01-18 DIAGNOSIS — M489 Spondylopathy, unspecified: Secondary | ICD-10-CM

## 2024-01-18 DIAGNOSIS — Z7985 Long-term (current) use of injectable non-insulin antidiabetic drugs: Secondary | ICD-10-CM

## 2024-01-18 DIAGNOSIS — I10 Essential (primary) hypertension: Secondary | ICD-10-CM | POA: Diagnosis not present

## 2024-01-18 DIAGNOSIS — Z1231 Encounter for screening mammogram for malignant neoplasm of breast: Secondary | ICD-10-CM

## 2024-01-18 DIAGNOSIS — R748 Abnormal levels of other serum enzymes: Secondary | ICD-10-CM

## 2024-01-18 DIAGNOSIS — E119 Type 2 diabetes mellitus without complications: Secondary | ICD-10-CM | POA: Diagnosis not present

## 2024-01-18 DIAGNOSIS — Z1329 Encounter for screening for other suspected endocrine disorder: Secondary | ICD-10-CM

## 2024-01-18 DIAGNOSIS — E785 Hyperlipidemia, unspecified: Secondary | ICD-10-CM

## 2024-01-18 DIAGNOSIS — Z7984 Long term (current) use of oral hypoglycemic drugs: Secondary | ICD-10-CM

## 2024-01-18 MED ORDER — METFORMIN HCL 500 MG PO TABS: ORAL_TABLET | ORAL | 3 refills | Status: AC

## 2024-01-18 MED ORDER — LOSARTAN POTASSIUM-HCTZ 50-12.5 MG PO TABS: 1.0000 | ORAL_TABLET | Freq: Every day | ORAL | 3 refills | Status: AC

## 2024-01-19 ENCOUNTER — Encounter: Payer: Self-pay | Admitting: Family Medicine

## 2024-01-19 LAB — LIPID PANEL
Cholesterol: 161 mg/dL (ref 0–200)
HDL: 62.8 mg/dL (ref >39.00)
LDL Cholesterol: 77 mg/dL (ref 0–99)
NonHDL: 98.4
Total CHOL/HDL Ratio: 3
Triglycerides: 109 mg/dL (ref 0.0–149.0)
VLDL: 21.8 mg/dL (ref 0.0–40.0)

## 2024-01-19 LAB — MICROALBUMIN / CREATININE URINE RATIO
Creatinine,U: 78.9 mg/dL
Microalb Creat Ratio: UNDETERMINED mg/g (ref 0.0–30.0)
Microalb, Ur: <0.7 mg/dL

## 2024-01-19 LAB — LIPASE: Lipase: 58 U/L (ref 11.0–59.0)

## 2024-01-19 LAB — HEMOGLOBIN A1C: Hgb A1c MFr Bld: 6 % (ref 4.6–6.5)

## 2024-01-19 LAB — TSH: TSH: 2.41 u[IU]/mL (ref 0.35–5.50)

## 2024-01-19 MED ORDER — TIRZEPATIDE 2.5 MG/0.5ML ~~LOC~~ SOAJ: 2.5000 mg | SUBCUTANEOUS | 2 refills | Status: DC

## 2024-01-19 NOTE — Addendum Note (Signed)
 Addended by: WATT RAISIN C on: 01/19/2024 07:33 PM   Modules accepted: Orders

## 2024-01-21 ENCOUNTER — Ambulatory Visit (HOSPITAL_BASED_OUTPATIENT_CLINIC_OR_DEPARTMENT_OTHER)

## 2024-01-22 ENCOUNTER — Ambulatory Visit (HOSPITAL_BASED_OUTPATIENT_CLINIC_OR_DEPARTMENT_OTHER)

## 2024-01-25 ENCOUNTER — Telehealth (HOSPITAL_BASED_OUTPATIENT_CLINIC_OR_DEPARTMENT_OTHER): Payer: Self-pay

## 2024-01-31 ENCOUNTER — Other Ambulatory Visit: Payer: Self-pay

## 2024-01-31 ENCOUNTER — Ambulatory Visit
Admission: RE | Admit: 2024-01-31 | Discharge: 2024-01-31 | Disposition: A | Discharge status: Home / Self Care | Payer: Self-pay | Source: Ambulatory Visit | Attending: Neurosurgery | Admitting: Neurosurgery

## 2024-01-31 ENCOUNTER — Ambulatory Visit
Admission: RE | Admit: 2024-01-31 | Discharge: 2024-01-31 | Disposition: A | Payer: Self-pay | Source: Ambulatory Visit | Attending: Neurosurgery | Admitting: Neurosurgery

## 2024-01-31 DIAGNOSIS — Z049 Encounter for examination and observation for unspecified reason: Secondary | ICD-10-CM

## 2024-01-31 NOTE — Progress Notes (Deleted)
 Referring Physician:  Watt Harlene BROCKS, MD [address] [place],  [place] [address]  Primary Physician:  Watt Harlene BROCKS, MD  History of Present Illness: 01/31/2024 [name] is here today with a chief complaint of ***  Neck pain that radiates into the left arm.    Duration: *** November 2024 Location: *** Quality: *** Severity: ***  Precipitating: aggravated by *** Modifying factors: made better by ***nothing Weakness: none Timing: ***constant Bowel/Bladder Dysfunction: none  Conservative measures:  Physical therapy: *** has participated in at [facility]? Has she been discharged?? Multimodal medical therapy including regular antiinflammatories: *** tizanidine , prednisone Injections: ***  11/14/23: C7-T1 ESI by Dr. Bonner  Past Surgery: ***no spinal surgeries  Claudia Gregory has ***no symptoms of cervical myelopathy.  The symptoms are causing a significant impact on the patient's life.   I have utilized the care everywhere function in epic to review the outside records available from external health systems.   Review of Systems:  A 10 point review of systems is negative, except for the pertinent positives and negatives detailed in the HPI.  Past Medical History: Past Medical History:  Diagnosis Date   Anxiety    Arthritis    Bipolar 1 disorder (HCC)    Carpal tunnel syndrome of left wrist    Cervical radiculopathy 10/31/2023   Chest pain, atypical    DDD (degenerative disc disease), cervical 12/21/2023   Deaf    Depression    Diabetes mellitus without complication (HCC)    past hx- off meds    FATIGUE    Gallstones    GERD (gastroesophageal reflux disease)    Hx of adenomatous polyp of colon 08/08/2014   Hyperlipidemia    Hypertension    OBESITY    Pneumonia     Past Surgical History: Past Surgical History:  Procedure Laterality Date   CHOLECYSTECTOMY  2000   COLONOSCOPY  2016   5 mm adenoma   POLYPECTOMY      TUBAL LIGATION  1998   UPPER GASTROINTESTINAL ENDOSCOPY      Allergies: Allergies as of 02/06/2024   (No Known Allergies)    Medications:  Current Outpatient Medications:    losartan -hydrochlorothiazide  (HYZAAR) 50-12.5 MG tablet, Take 1 tablet by mouth daily., Disp: 90 tablet, Rfl: 3   metFORMIN  (GLUCOPHAGE ) 500 MG tablet, Take 2 tablets (1,000 mg total) by mouth in the morning AND 1 tablet (500 mg total) every evening., Disp: 270 tablet, Rfl: 3   rosuvastatin  (CRESTOR ) 20 MG tablet, TAKE 1 TABLET BY MOUTH EVERY DAY, Disp: 90 tablet, Rfl: 3   sertraline  (ZOLOFT ) 100 MG tablet, Take 1 tablet (100 mg total) by mouth at bedtime., Disp: 135 tablet, Rfl: 3   tirzepatide  (MOUNJARO ) 2.5 MG/0.5ML Pen, Inject 2.5 mg into the skin once a week., Disp: 6 mL, Rfl: 2  Social History: Social History   Tobacco Use   Smoking status: Never   Smokeless tobacco: Never  Vaping Use   Vaping status: Never Used  Substance Use Topics   Alcohol use: Not Currently    Alcohol/week: 2.0 standard drinks of alcohol    Types: 2 Glasses of wine per week    Comment: rare glass of wine    Drug use: No    Family Medical History: Family History  Problem Relation Age of Onset   Brain cancer Mother    Hypertension Mother    Diabetes Mother    Anxiety disorder Mother  Arthritis Mother    Cancer Mother    Diabetes Father    Diabetes Maternal Aunt    Anxiety disorder Maternal Aunt    Heart attack Maternal Grandmother    Hypertension Maternal Grandmother    Colon cancer Neg Hx    Rectal cancer Neg Hx    Stomach cancer Neg Hx    Colon polyps Neg Hx    Esophageal cancer Neg Hx     Physical Examination: There were no vitals filed for this visit.  General: Patient is in no apparent distress. Attention to examination is appropriate.  Neck:   Supple.  Full range of motion.  Respiratory: Patient is breathing without any difficulty.   NEUROLOGICAL:     Awake, alert, oriented to person, place, and  time.  Speech is clear and fluent.   Cranial Nerves: Pupils equal round and reactive to light.  Facial tone is symmetric.  Facial sensation is symmetric. Shoulder shrug is symmetric. Tongue protrusion is midline.  There is no pronator drift.  Strength: Side Biceps Triceps Deltoid Interossei Grip Wrist Ext. Wrist Flex.  R 5 5 5 5 5 5 5   L 5 5 5 5 5 5 5    Side Iliopsoas Quads Hamstring PF DF EHL  R 5 5 5 5 5 5   L 5 5 5 5 5 5    Reflexes are ***2+ and symmetric at the biceps, triceps, brachioradialis, patella and achilles.   Hoffman's is absent.   Bilateral upper and lower extremity sensation is intact to light touch.    No evidence of dysmetria noted.  Gait is normal.     Medical Decision Making  Imaging: ***  I have personally reviewed the images and agree with the above interpretation.  Assessment and Plan: [name] is a pleasant 63 y.o. female with ***      Thank you for involving me in the care of this patient.      Chester K. Clois MD, [facility] Neurosurgery

## 2024-02-06 ENCOUNTER — Ambulatory Visit: Admitting: Neurosurgery

## 2024-02-13 NOTE — Progress Notes (Addendum)
 Referring Physician:  Watt Harlene BROCKS, MD [address] [place],  [place] [address]  Primary Physician:  Watt Harlene BROCKS, MD  History of Present Illness: 02/20/2024 [name] is here today with a chief complaint of neck and arm pain. She has had pain for about 8 months.  Neck pain has been present since November of last year, described as an 'electricity' sensation that has progressively worsened. 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 Relief is achieved with a heating pad.  Pain is described as a pressure sensation in the neck, radiating from the shoulder down to the middle of the back, occasionally extending down the arm. No numbness or tingling in the arm currently, but does occur sometimes.  Certain movements, such as looking up, down, or to the sides, exacerbate the pain, particularly on the right side, with a pulling sensation in the neck. She works in a [employer], lifting boxes weighing 20 to 30 pounds, and is concerned about her ability to continue working due to her symptoms.  Discussed the use of AI scribe software for clinical note transcription with the patient, who gave verbal consent to proceed.  Bowel/Bladder Dysfunction: none  Conservative measures:  Physical therapy: 2 months at [facility] - didn't help Multimodal medical therapy including regular antiinflammatories:  tizanidine , prednisone Injections:   11/14/23: C7-T1 ESI by Dr. Bonner (made worse)  Past Surgery: no spinal surgeries  Claudia Gregory has no symptoms of cervical myelopathy.  The symptoms are causing a significant impact on the patient's life.   I have utilized the care everywhere function in epic to review the outside records available from external health systems.  Please note that we utilized a radiation protection practitioner for the patient.   Review of Systems:  A 10  point review of systems is negative, except for the pertinent positives and negatives detailed in the HPI.  Past Medical History: Past Medical History:  Diagnosis Date   Anxiety    Arthritis    Bipolar 1 disorder (HCC)    Carpal tunnel syndrome of left wrist    Cervical radiculopathy 10/31/2023   Chest pain, atypical    DDD (degenerative disc disease), cervical 12/21/2023   Deaf    Depression    Diabetes mellitus without complication (HCC)    past hx- off meds    FATIGUE    Gallstones    GERD (gastroesophageal reflux disease)    Hx of adenomatous polyp of colon 08/08/2014   Hyperlipidemia    Hypertension    OBESITY    Pneumonia     Past Surgical History: Past Surgical History:  Procedure Laterality Date   CHOLECYSTECTOMY  2000   COLONOSCOPY  2016   5 mm adenoma   POLYPECTOMY     TUBAL LIGATION  1998   UPPER GASTROINTESTINAL ENDOSCOPY      Allergies: Allergies as of 02/20/2024   (No Known Allergies)    Medications:  Current Outpatient Medications:    losartan -hydrochlorothiazide  (HYZAAR) 50-12.5 MG tablet, Take 1 tablet by mouth daily., Disp: 90 tablet, Rfl: 3   metFORMIN  (GLUCOPHAGE ) 500 MG tablet, Take 2 tablets (1,000 mg total) by mouth in the morning AND 1 tablet (500 mg total) every evening., Disp: 270 tablet, Rfl: 3   rosuvastatin  (CRESTOR ) 20 MG tablet, TAKE 1 TABLET BY MOUTH EVERY DAY, Disp: 90 tablet, Rfl: 3   sertraline  (ZOLOFT )  100 MG tablet, Take 1 tablet (100 mg total) by mouth at bedtime., Disp: 135 tablet, Rfl: 3   tirzepatide  (MOUNJARO ) 2.5 MG/0.5ML Pen, Inject 2.5 mg into the skin once a week., Disp: 6 mL, Rfl: 2  Social History: Social History   Tobacco Use   Smoking status: Never   Smokeless tobacco: Never  Vaping Use   Vaping status: Never Used  Substance Use Topics   Alcohol use: Not Currently    Alcohol/week: 2.0 standard drinks of alcohol    Types: 2 Glasses of wine per week    Comment: rare glass of wine    Drug use: No     Family Medical History: Family History  Problem Relation Age of Onset   Brain cancer Mother    Hypertension Mother    Diabetes Mother    Anxiety disorder Mother    Arthritis Mother    Cancer Mother    Diabetes Father    Diabetes Maternal Aunt    Anxiety disorder Maternal Aunt    Heart attack Maternal Grandmother    Hypertension Maternal Grandmother    Colon cancer Neg Hx    Rectal cancer Neg Hx    Stomach cancer Neg Hx    Colon polyps Neg Hx    Esophageal cancer Neg Hx     Physical Examination: Vitals:   02/20/24 1414  BP: 122/66    General: Patient is in no apparent distress. Attention to examination is appropriate.  Neck:   Supple.  Full range of motion.  Respiratory: Patient is breathing without any difficulty.   NEUROLOGICAL:     Awake, alert, oriented to person, place, and time.  Speech is clear and fluent.   Cranial Nerves: Pupils equal round and reactive to light.  Facial tone is symmetric.  Facial sensation is symmetric. Shoulder shrug is symmetric. Tongue protrusion is midline.  There is no pronator drift.  Strength: Side Biceps Triceps Deltoid Interossei Grip Wrist Ext. Wrist Flex.  R 5 5 5 5 5 5 5   L 5 5 5 5 5 5 5    Side Iliopsoas Quads Hamstring PF DF EHL  R 5 5 5 5 5 5   L 5 5 5 5 5 5    Reflexes are 1+ and symmetric at the biceps, triceps, brachioradialis, patella and achilles.   Hoffman's is absent.   Bilateral upper and lower extremity sensation is intact to light touch.    No evidence of dysmetria noted.  Gait is normal.     Medical Decision Making  Imaging: MRI cervical spine Moderate to severe disc narrowing C4-C7.  C3-4 shows moderate to severe left foraminal stenosis.  C4-5 shows mild to moderate to moderate central canal stenosis with mild cord abutment and moderate right foraminal stenosis.  C5-6 shows moderate left foraminal stenosis.  C6-7 shows moderate to severe left foraminal stenosis.  X-rays were reviewed and show  anterolisthesis of C3 on C4 as well as C4 on C5 on my review.  I have personally reviewed the images and agree with the above interpretation.  Assessment and Plan: [name] is a pleasant 63 y.o. female with multilevel cervical stenosis with cervical radiculopathy and anterolisthesis.  She has neck pain.  She has tried and failed conservative management.  There is no further role for conservative management.  I have recommended C3-7 anterior cervical discectomy and fusion.  I discussed the planned procedure at length with the patient, including the risks, benefits, alternatives, and indications. The risks discussed include but are not  limited to bleeding, infection, need for reoperation, spinal fluid leak, stroke, vision loss, anesthetic complication, coma, paralysis, and even death. We also discussed the possibility of post-operative dysphagia, vocal cord paralysis, and the risk of adjacent segment disease in the future. I also described in detail that improvement was not guaranteed.  The patient expressed understanding of these risks. I described the surgery in layman's terms, and gave ample opportunity for questions, which were answered to the best of my ability.  She previously saw one of my colleagues in [place].  I think she is in excellent hands.  I encouraged her to contact Dr. Burnetta to schedule surgical intervention as our opinions were very similar.  If she ultimately elects to return to our office, I will be happy to take care of her.    I spent a total of 30 minutes in this patient's care today. This time was spent reviewing pertinent records including imaging studies, obtaining and confirming history, performing a directed evaluation, formulating and discussing my recommendations, and documenting the visit within the medical record.      Thank you for involving me in the care of this patient.      Elli Groesbeck K. Clois MD, [facility] Neurosurgery

## 2024-02-13 NOTE — H&P (View-Only) (Signed)
 Referring Physician:  Watt Harlene BROCKS, MD [address] [place],  [place] [address]  Primary Physician:  Watt Harlene BROCKS, MD  History of Present Illness: 02/20/2024 Claudia Gregory is here today with a chief complaint of neck and arm pain. She has had pain for about 8 months.  Neck pain has been present since November of last year, described as an 'electricity' sensation that has progressively worsened. 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 Relief is achieved with a heating pad.  Pain is described as a pressure sensation in the neck, radiating from the shoulder down to the middle of the back, occasionally extending down the arm. No numbness or tingling in the arm currently, but does occur sometimes.  Certain movements, such as looking up, down, or to the sides, exacerbate the pain, particularly on the right side, with a pulling sensation in the neck. She works in a [employer], lifting boxes weighing 20 to 30 pounds, and is concerned about her ability to continue working due to her symptoms.  Discussed the use of AI scribe software for clinical note transcription with the patient, who gave verbal consent to proceed.  Bowel/Bladder Dysfunction: none  Conservative measures:  Physical therapy: 2 months at [facility] - didn't help Multimodal medical therapy including regular antiinflammatories:  tizanidine , prednisone Injections:   11/14/23: C7-T1 ESI by Dr. Bonner (made worse)  Past Surgery: no spinal surgeries  Claudia Gregory has no symptoms of cervical myelopathy.  The symptoms are causing a significant impact on the patient's life.   I have utilized the care everywhere function in epic to review the outside records available from external health systems.  Please note that we utilized a radiation protection practitioner for the patient.   Review of Systems:  A 10  point review of systems is negative, except for the pertinent positives and negatives detailed in the HPI.  Past Medical History: Past Medical History:  Diagnosis Date   Anxiety    Arthritis    Bipolar 1 disorder (HCC)    Carpal tunnel syndrome of left wrist    Cervical radiculopathy 10/31/2023   Chest pain, atypical    DDD (degenerative disc disease), cervical 12/21/2023   Deaf    Depression    Diabetes mellitus without complication (HCC)    past hx- off meds    FATIGUE    Gallstones    GERD (gastroesophageal reflux disease)    Hx of adenomatous polyp of colon 08/08/2014   Hyperlipidemia    Hypertension    OBESITY    Pneumonia     Past Surgical History: Past Surgical History:  Procedure Laterality Date   CHOLECYSTECTOMY  2000   COLONOSCOPY  2016   5 mm adenoma   POLYPECTOMY     TUBAL LIGATION  1998   UPPER GASTROINTESTINAL ENDOSCOPY      Allergies: Allergies as of 02/20/2024   (No Known Allergies)    Medications:  Current Outpatient Medications:    losartan -hydrochlorothiazide  (HYZAAR) 50-12.5 MG tablet, Take 1 tablet by mouth daily., Disp: 90 tablet, Rfl: 3   metFORMIN  (GLUCOPHAGE ) 500 MG tablet, Take 2 tablets (1,000 mg total) by mouth in the morning AND 1 tablet (500 mg total) every evening., Disp: 270 tablet, Rfl: 3   rosuvastatin  (CRESTOR ) 20 MG tablet, TAKE 1 TABLET BY MOUTH EVERY DAY, Disp: 90 tablet, Rfl: 3   sertraline  (ZOLOFT )  100 MG tablet, Take 1 tablet (100 mg total) by mouth at bedtime., Disp: 135 tablet, Rfl: 3   tirzepatide  (MOUNJARO ) 2.5 MG/0.5ML Pen, Inject 2.5 mg into the skin once a week., Disp: 6 mL, Rfl: 2  Social History: Social History   Tobacco Use   Smoking status: Never   Smokeless tobacco: Never  Vaping Use   Vaping status: Never Used  Substance Use Topics   Alcohol use: Not Currently    Alcohol/week: 2.0 standard drinks of alcohol    Types: 2 Glasses of wine per week    Comment: rare glass of wine    Drug use: No     Family Medical History: Family History  Problem Relation Age of Onset   Brain cancer Mother    Hypertension Mother    Diabetes Mother    Anxiety disorder Mother    Arthritis Mother    Cancer Mother    Diabetes Father    Diabetes Maternal Aunt    Anxiety disorder Maternal Aunt    Heart attack Maternal Grandmother    Hypertension Maternal Grandmother    Colon cancer Neg Hx    Rectal cancer Neg Hx    Stomach cancer Neg Hx    Colon polyps Neg Hx    Esophageal cancer Neg Hx     Physical Examination: Vitals:   02/20/24 1414  BP: 122/66    General: Patient is in no apparent distress. Attention to examination is appropriate.  Neck:   Supple.  Full range of motion.  Respiratory: Patient is breathing without any difficulty.   NEUROLOGICAL:     Awake, alert, oriented to person, place, and time.  Speech is clear and fluent.   Cranial Nerves: Pupils equal round and reactive to light.  Facial tone is symmetric.  Facial sensation is symmetric. Shoulder shrug is symmetric. Tongue protrusion is midline.  There is no pronator drift.  Strength: Side Biceps Triceps Deltoid Interossei Grip Wrist Ext. Wrist Flex.  R 5 5 5 5 5 5 5   L 5 5 5 5 5 5 5    Side Iliopsoas Quads Hamstring PF DF EHL  R 5 5 5 5 5 5   L 5 5 5 5 5 5    Reflexes are 1+ and symmetric at the biceps, triceps, brachioradialis, patella and achilles.   Hoffman's is absent.   Bilateral upper and lower extremity sensation is intact to light touch.    No evidence of dysmetria noted.  Gait is normal.     Medical Decision Making  Imaging: MRI cervical spine Moderate to severe disc narrowing C4-C7.  C3-4 shows moderate to severe left foraminal stenosis.  C4-5 shows mild to moderate to moderate central canal stenosis with mild cord abutment and moderate right foraminal stenosis.  C5-6 shows moderate left foraminal stenosis.  C6-7 shows moderate to severe left foraminal stenosis.  X-rays were reviewed and show  anterolisthesis of C3 on C4 as well as C4 on C5 on my review.  I have personally reviewed the images and agree with the above interpretation.  Assessment and Plan: Ms. [name] is a pleasant 63 y.o. female with multilevel cervical stenosis with cervical radiculopathy and anterolisthesis.  She has neck pain.  She has tried and failed conservative management.  There is no further role for conservative management.  I have recommended C3-7 anterior cervical discectomy and fusion.  I discussed the planned procedure at length with the patient, including the risks, benefits, alternatives, and indications. The risks discussed include but are not  limited to bleeding, infection, need for reoperation, spinal fluid leak, stroke, vision loss, anesthetic complication, coma, paralysis, and even death. We also discussed the possibility of post-operative dysphagia, vocal cord paralysis, and the risk of adjacent segment disease in the future. I also described in detail that improvement was not guaranteed.  The patient expressed understanding of these risks. I described the surgery in layman's terms, and gave ample opportunity for questions, which were answered to the best of my ability.  She previously saw one of my colleagues in [place].  I think she is in excellent hands.  I encouraged her to contact Dr. Burnetta to schedule surgical intervention as our opinions were very similar.  If she ultimately elects to return to our office, I will be happy to take care of her.    I spent a total of 30 minutes in this patient's care today. This time was spent reviewing pertinent records including imaging studies, obtaining and confirming history, performing a directed evaluation, formulating and discussing my recommendations, and documenting the visit within the medical record.      Thank you for involving me in the care of this patient.      Elli Groesbeck K. Clois MD, [facility] Neurosurgery

## 2024-02-20 ENCOUNTER — Encounter: Payer: Self-pay | Admitting: Neurosurgery

## 2024-02-20 ENCOUNTER — Ambulatory Visit: Admitting: Neurosurgery

## 2024-02-20 VITALS — BP 122/66 | Ht 66.0 in | Wt 167.0 lb

## 2024-02-20 DIAGNOSIS — M5412 Radiculopathy, cervical region: Secondary | ICD-10-CM

## 2024-02-20 DIAGNOSIS — M4802 Spinal stenosis, cervical region: Secondary | ICD-10-CM | POA: Diagnosis not present

## 2024-02-20 DIAGNOSIS — M542 Cervicalgia: Secondary | ICD-10-CM

## 2024-02-20 DIAGNOSIS — M4312 Spondylolisthesis, cervical region: Secondary | ICD-10-CM

## 2024-02-20 NOTE — Addendum Note (Signed)
 Addended by: Milani Lowenstein on: 02/20/2024 02:58 PM   Modules accepted: Orders

## 2024-02-20 NOTE — Patient Instructions (Signed)
 Contact us  if you would like to schedule surgery or if we can be of further assistance.

## 2024-02-27 ENCOUNTER — Telehealth: Payer: Self-pay

## 2024-02-27 NOTE — Telephone Encounter (Signed)
 Patient called to schedule surgery with the use of interpreter. Scheduled surgery and went over surgical instructions as listed below. Post op appointments scheduled. Sent instructions in a fpl group.    Please see below for information in regards to your upcoming surgery:   Planned surgery: C3-7 Anterior Cervical Discectomy and Fusion   Surgery date: 03/20/24 at [facility] (Medical Mall: [address] [place], [place] [address]) - you will find out your arrival time the business day before your surgery.   Pre-op appointment at [facility] Pre-admit Testing: you will receive a call with a date/time for this appointment. If you are scheduled for an in person appointment, Pre-admit Testing is located on the first floor of the [facility] building, [address], [address]. During this appointment, they will advise you which medications you can take the morning of surgery, and which medications you will need to hold for surgery. Labs (such as blood work, EKG) may be done at your pre-op appointment. You are not required to fast for these labs. Should you need to change your pre-op appointment, please call Pre-admit testing at [phone].      Diabetes/heart failure/kidney disease/weight loss medications that require an extended hold: Per anesthesia guidelines (due to the increased risk of aspiration caused by delayed gastric emptying):  Metformin : hold for 2 days prior to surgery  Tirzepatide  (Mounjaro ) injection: hold for 7 days prior to surgery    Surgical clearance: we will send a clearance form to Harlene Schroeder, MD. They may wish to see you in their office prior to signing the clearance form. If so, they may call you to schedule an appointment.     Brace: You will need to bring the brace to the hospital on the day of surgery. [facility] will contact you regarding an appointment for the brace you will use after surgery. If it is  getting close to your surgery date and you have not received an appointment with [facility], please reach out to us .  Their number is [phone] (for the [place] location) or [phone] (for the [facility] location) should you miss their call or have an issue with your brace after surgery.     NSAIDS (Non-steroidal anti-inflammatory drugs): because you are having a fusion, please avoid taking any NSAIDS (examples: ibuprofen, motrin, aleve , naproxen , meloxicam , diclofenac) for 3 months after surgery. Celebrex is an exception and is OK to take, if prescribed. Tylenol  is not an NSAID.    Common restrictions after spine surgery: No bending, lifting, or twisting ("BLT"). Avoid lifting objects heavier than 10 pounds for the first 6 weeks after surgery. Where possible, avoid household activities that involve lifting, bending, reaching, pushing, or pulling such as laundry, vacuuming, grocery shopping, and childcare. Try to arrange for help from friends and family for these activities while you heal. Do not drive while taking prescription pain medication. Weeks 6 through 12 after surgery: avoid lifting more than 25 pounds.     X-rays after surgery: Because you are having a fusion or arthroplasty: for appointments after your 2 week follow-up: please arrive our office 30 minutes prior to your appointment for x-rays. This applies to every appointment after your 2 week follow-up. Failure to do so may result in your appointment being rescheduled.     How to contact us :  If you have any questions/concerns before or after surgery, you can reach us  at [phone], or you can send a mychart message. We can be reached by phone or  mychart 8am-4pm, Monday-Friday.  *Please note: Calls after 4pm are forwarded to a third party answering service. Mychart messages are not routinely monitored during evenings, weekends, and holidays. Please call our office to contact the answering service for urgent concerns during  non-business hours.    If you have FMLA/disability paperwork, please drop it off or fax it to [phone]   Appointments/FMLA & disability paperwork: Reche Hait, & Nichole Registered Nurse/Surgery scheduler: Kendelyn, RN & Katie, RN Certified Medical Assistants: Don, CMA, Elenor, CMA, Damien, CMA, & Auston, [place] Physician Assistants: Lyle Decamp, PA-C, Edsel Goods, PA-C & Glade Boys, PA-C Surgeons: Penne Sharps, MD & Reeves Daisy, MD    [facility] PREADMIT TESTING VISIT and SURGERY INFORMATION SHEET   Now that surgery has been scheduled you can anticipate several phone calls from [facility] services. A pharmacy technician will call you to verify your current list of medications taken at home.               The Pre-Service Center will call to verify your insurance information and to give you billing estimates and information.             The Preadmit Testing Office will be calling to schedule a visit to obtain information for the anesthesia team and provide instructions on preparation for surgery.  What can you expect for the Preadmit Testing Visit: Appointments may be scheduled in-person or by telephone.  If a telephone visit is scheduled, you may be asked to come into the office to have lab tests or other studies performed.   This visit will not be completed any greater than 14 days prior to your surgery.  If your surgery has been scheduled for a future date, please do not be alarmed if we have not contacted you to schedule an appointment more than a month prior to the surgery date.    Please be prepared to provide the following information during this appointment:            -Personal medical history                                               -Medication and allergy list            -Any history of problems with anesthesia              -Recent lab work or diagnostic studies            -Please notify us  of any needs we should be aware of  to provide the best care possible           -You will be provided with instructions on how to prepare for your surgery.    On The Day of Surgery:  You must have a driver to take you home after surgery, you will be asked not to drive for 24 hours following surgery.  Taxi, Gisele and non-medical transport will not be acceptable means of transportation unless you have a responsible individual who will be traveling with you.  Visitors in the surgical area:   2 people will be able to visit you in your room once your preparation for surgery has been completed. During surgery, your visitors will be asked to wait in the Surgery Waiting Area.  It is not a requirement for them to stay, if they prefer to  leave and come back.  Your visitor(s) will be given an update once the surgery has been completed.  No visitors are allowed in the initial recovery room to respect patient privacy and safety.  Once you are more awake and transfer to the secondary recovery area, or are transferred to an inpatient room, visitors will again be able to see you.  To respect and protect your privacy: We will ask on the day of surgery who your driver will be and what the contact number for that individual will be. We will ask if it is okay to share information with this individual, or if there is an alternative individual that we, or the surgeon, should contact to provide updates and information. If family or friends come to the surgical information desk requesting information about you, who you have not listed with us , no information will be given.   It may be helpful to designate someone as the main contact who will be responsible for updating your other friends and family.    PREADMIT TESTING OFFICE: [phone] SAME DAY SURGERY: [phone] We look forward to caring for you before and throughout the process of your surgery.

## 2024-02-28 ENCOUNTER — Other Ambulatory Visit: Payer: Self-pay

## 2024-02-28 DIAGNOSIS — M5412 Radiculopathy, cervical region: Secondary | ICD-10-CM

## 2024-02-28 DIAGNOSIS — Z01818 Encounter for other preprocedural examination: Secondary | ICD-10-CM

## 2024-02-28 DIAGNOSIS — M542 Cervicalgia: Secondary | ICD-10-CM

## 2024-02-28 DIAGNOSIS — M4802 Spinal stenosis, cervical region: Secondary | ICD-10-CM

## 2024-02-28 DIAGNOSIS — M4312 Spondylolisthesis, cervical region: Secondary | ICD-10-CM

## 2024-03-01 ENCOUNTER — Encounter: Payer: Self-pay | Admitting: Family Medicine

## 2024-03-03 NOTE — Progress Notes (Unsigned)
 [facility] at [facility] [address] [place], [place] [address] [phone]  Date:  03/06/2024   Name:  Claudia Gregory   DOB:  [birth year]   MRN:  [id number]  PCP:  Watt Harlene BROCKS, MD    Chief Complaint: Pre-op Exam   History of Present Illness:  Claudia Gregory is a 63 y.o. very pleasant female patient who presents with the following:  Claudia Gregory is seen today for a preoperative assessment.  She plans to have a surgical discectomy and fusion per Dr.Yarborough later this month I saw her in the clinic about 6 weeks ago for her physical-she is doing well but we do not have a recent EKG so I brought her back in today History of deafness, hypertension, GERD, hyperlipidemia, obesity, peripheral neuropathy, diabetes, fatty liver with elevation of LFT  Visit today accomplished with aid of in person ASL interpreter  Her diabetes has been under very good control, most recent A1c 6% Lab Results  Component Value Date   HGBA1C 6.0 01/18/2024    Discussed the use of AI scribe software for clinical note transcription with the patient, who gave verbal consent to proceed.  History of Present Illness Claudia Gregory is a 63 year old female with diabetes who presents for pre-operative evaluation for cervical spine surgery.  She is scheduled for cervical spine surgery involving levels C3 to C7 - She plans to stay overnight post-surgery.  Recent A1c is at goal. Her current medications include Mounjaro , which she plans to reduce to 2.5 mg, and Metformin , which she will stop two days prior to surgery.  She underwent a stress test in 2022-normal.  We checked her EKG today, mildly abnormal.  We talked about her exercise capacity- she experiences dyspnea on exertion, particularly when climbing stairs, such as during a recent incident when she had to walk up to a fourth-floor apartment. She generally walks frequently, including a recent trip to  [place], where she did not experience any issues walking on flat ground.  However she does note walking up hills or climbing steps may leave her short of breath.  She is not having chest pain.  She also feels like her exercise ability is limited currently by her neck problems as well as knee arthritis  She is preparing for time off work for the surgery, with FMLA coverage expected until early March. She anticipates being out of work for less than three months post-surgery.    Patient Active Problem List   Diagnosis Date Noted   Gross hematuria 04/28/2022   Dysuria 04/28/2022   Controlled type 2 diabetes mellitus without complication, without long-term current use of insulin (HCC) 07/11/2019   Generalized anxiety disorder 12/28/2015   Elevated liver enzymes 12/28/2015   Plantar fasciitis of right foot 04/01/2015   Neuropathic pain of both feet 10/27/2014   Hx of adenomatous polyp of colon 08/08/2014   Deafness 08/04/2014   GERD (gastroesophageal reflux disease) 05/13/2014   Hyperlipidemia 02/05/2013   Hot flashes 11/24/2012   OBESITY 10/23/2008   Hypertension 10/23/2008   BIPOLAR DISORDER UNSPECIFIED 09/02/2008    Past Medical History:  Diagnosis Date   Anxiety    Arthritis    Bipolar 1 disorder (HCC)    Carpal tunnel syndrome of left wrist    Cervical radiculopathy 10/31/2023   Chest pain, atypical    DDD (degenerative disc disease), cervical 12/21/2023   Deaf    Depression  Diabetes mellitus without complication (HCC)    past hx- off meds    FATIGUE    Gallstones    GERD (gastroesophageal reflux disease)    Hx of adenomatous polyp of colon 08/08/2014   Hyperlipidemia    Hypertension    OBESITY    Pneumonia     Past Surgical History:  Procedure Laterality Date   CHOLECYSTECTOMY  2000   COLONOSCOPY  2016   5 mm adenoma   POLYPECTOMY     TUBAL LIGATION  1998   UPPER GASTROINTESTINAL ENDOSCOPY      Social History   Tobacco Use   Smoking status: Never    Smokeless tobacco: Never  Vaping Use   Vaping status: Never Used  Substance Use Topics   Alcohol use: Not Currently    Alcohol/week: 2.0 standard drinks of alcohol    Types: 2 Glasses of wine per week    Comment: rare glass of wine    Drug use: No    Family History  Problem Relation Age of Onset   Brain cancer Mother    Hypertension Mother    Diabetes Mother    Anxiety disorder Mother    Arthritis Mother    Cancer Mother    Diabetes Father    Diabetes Maternal Aunt    Anxiety disorder Maternal Aunt    Heart attack Maternal Grandmother    Hypertension Maternal Grandmother    Colon cancer Neg Hx    Rectal cancer Neg Hx    Stomach cancer Neg Hx    Colon polyps Neg Hx    Esophageal cancer Neg Hx     No Known Allergies  Medication list has been reviewed and updated.  Current Outpatient Medications on File Prior to Visit  Medication Sig Dispense Refill   acetaminophen  (TYLENOL ) 500 MG tablet Take 1,000 mg by mouth every 6 (six) hours as needed for headache (Neck pain).     Glycerin-Hypromellose-PEG 400 (DRY EYE RELIEF DROPS OP) Place 1 drop into both eyes daily as needed (Dry eye). [employer] dry eye advance     losartan -hydrochlorothiazide  (HYZAAR) 50-12.5 MG tablet Take 1 tablet by mouth daily. 90 tablet 3   Melatonin 10 MG TABS Take 20-25 mg by mouth at bedtime. Gummies     metFORMIN  (GLUCOPHAGE ) 500 MG tablet Take 2 tablets (1,000 mg total) by mouth in the morning AND 1 tablet (500 mg total) every evening. (Patient taking differently: Take 2 tablets (1,000 mg total) by mouth in the morning with breakfast) 270 tablet 3   naproxen  sodium (ALEVE ) 220 MG tablet Take 440 mg by mouth daily as needed (Headache & neck pain).     OVER THE COUNTER MEDICATION Apply 1 application  topically daily as needed (Neck pain). Avid Hemp Cream 700 mg     OVER THE COUNTER MEDICATION Apply 1 application  topically daily as needed (Neck pain). Care all Arthritis & Muscle cream cream      rosuvastatin  (CRESTOR ) 20 MG tablet TAKE 1 TABLET BY MOUTH EVERY DAY 90 tablet 3   sertraline  (ZOLOFT ) 100 MG tablet Take 1 tablet (100 mg total) by mouth at bedtime. 135 tablet 3   sodium chloride  (OCEAN) 0.65 % SOLN nasal spray Place 1 spray into both nostrils daily as needed for congestion.     tirzepatide  (MOUNJARO ) 2.5 MG/0.5ML Pen Inject 2.5 mg into the skin once a week. 6 mL 2   No current facility-administered medications on file prior to visit.    Review of  Systems:  As per HPI- otherwise negative.   Physical Examination: Vitals:   03/06/24 1349  BP: 130/82  Pulse: 68  Temp: 97.7 F (36.5 C)  SpO2: 98%   Vitals:   03/06/24 1349  Weight: 166 lb (75.3 kg)  Height: 5' 6 (1.676 m)   Body mass index is 26.79 kg/m. Ideal Body Weight: Weight in (lb) to have BMI = 25: 154.6  GEN: no acute distress.  Minimal overweight, looks well HEENT: Atraumatic, Normocephalic.  Ears and Nose: No external deformity. CV: RRR, No M/G/R. No JVD. No thrill. No extra heart sounds. PULM: CTA B, no wheezes, crackles, rhonchi. No retractions. No resp. distress. No accessory muscle use. ABD: S, NT, ND, +BS. No rebound. No HSM. EXTR: No c/c/e PSYCH: Normally interactive. Conversant.   EKG: Sinus rhythm with poor R wave progression.  Per machine consider old anterior infarct.  Compared with EKG from 2021 I do not note any specific changes except for decreased R wave progression Assessment and Plan: Preoperative evaluation to rule out surgical contraindication - Plan: EKG 12-Lead  Assessment & Plan Preoperative evaluation for cervical spine surgery Undergoing evaluation for cervical spine surgery from C3 to C7. Reports dyspnea on exertion. EKG shows abnormalities consistent with previous results. Further cardiac evaluation necessary due to dyspnea on exertion and surgery magnitude. - Ordered treadmill stress test to assess cardiac function. - Ordered chest X-ray. - Coordinated with referrals  to expedite treadmill stress test. - Advised to skip Mounjaro  one week prior to surgery as recommended by her surgeon. - Advised to skip Metformin  two days prior to surgery as recommended by her surgeon  Type 2 diabetes mellitus, controlled Type 2 diabetes mellitus well-controlled with satisfactory recent A1c. Currently on Mounjaro  and Metformin . - Refilled Mounjaro  prescription. - Advised to resume Mounjaro  and Metformin  post-surgery unless otherwise directed.  Signed Harlene Schroeder, MD  Received her chest film as below, message to  Did notDG Chest 2 View Result Date: 03/06/2024 CLINICAL DATA:  pre-operative EXAM: DG CHEST 2V COMPARISON:  02/13/2019 FINDINGS: Biapical pleural thickening. No focal airspace consolidation, pleural effusion, or pneumothorax. No cardiomegaly.Tortuous aorta.No acute fracture or destructive lesion. Multilevel thoracic osteophytosis. IMPRESSION: No acute cardiopulmonary abnormality. Electronically Signed   By: Rogelia Myers M.D.   On: 03/06/2024 15:44   I also sent a staff message to Dr. Clois updating him about this minor delay in her preoperative clearance.  I am hopeful she will still be ready at her planned surgery date of December 17

## 2024-03-03 NOTE — Patient Instructions (Incomplete)
 It was good to see you today, best of luck with your upcoming surgery!  I will get you set up for a treadmill test asap!  I will update your surgeon about the stress test Please stop by imaging on the ground floor to have your chest x-ray

## 2024-03-04 ENCOUNTER — Other Ambulatory Visit: Payer: Self-pay | Admitting: Family Medicine

## 2024-03-04 DIAGNOSIS — E119 Type 2 diabetes mellitus without complications: Secondary | ICD-10-CM

## 2024-03-06 ENCOUNTER — Ambulatory Visit: Admitting: Family Medicine

## 2024-03-06 ENCOUNTER — Encounter: Payer: Self-pay | Admitting: Family Medicine

## 2024-03-06 ENCOUNTER — Ambulatory Visit (HOSPITAL_BASED_OUTPATIENT_CLINIC_OR_DEPARTMENT_OTHER)
Admission: RE | Admit: 2024-03-06 | Discharge: 2024-03-06 | Disposition: A | Source: Ambulatory Visit | Attending: Family Medicine | Admitting: Family Medicine

## 2024-03-06 VITALS — BP 130/82 | HR 68 | Temp 97.7°F | Ht 66.0 in | Wt 166.0 lb

## 2024-03-06 DIAGNOSIS — E119 Type 2 diabetes mellitus without complications: Secondary | ICD-10-CM

## 2024-03-06 DIAGNOSIS — I771 Stricture of artery: Secondary | ICD-10-CM | POA: Diagnosis not present

## 2024-03-06 DIAGNOSIS — Z01818 Encounter for other preprocedural examination: Secondary | ICD-10-CM | POA: Insufficient documentation

## 2024-03-06 DIAGNOSIS — J929 Pleural plaque without asbestos: Secondary | ICD-10-CM | POA: Diagnosis not present

## 2024-03-06 DIAGNOSIS — Z7985 Long-term (current) use of injectable non-insulin antidiabetic drugs: Secondary | ICD-10-CM | POA: Diagnosis not present

## 2024-03-06 DIAGNOSIS — Z7984 Long term (current) use of oral hypoglycemic drugs: Secondary | ICD-10-CM | POA: Diagnosis not present

## 2024-03-06 MED ORDER — TIRZEPATIDE 2.5 MG/0.5ML ~~LOC~~ SOAJ: 2.5000 mg | SUBCUTANEOUS | 2 refills | Status: AC

## 2024-03-07 DIAGNOSIS — K047 Periapical abscess without sinus: Secondary | ICD-10-CM

## 2024-03-07 HISTORY — DX: Periapical abscess without sinus: K04.7

## 2024-03-07 NOTE — Patient Instructions (Addendum)
 Your procedure is scheduled on:03-20-24 Wednesday Report to the Registration Desk on the 1st floor of the [facility].Then proceed to the 2nd floor Surgery Desk To find out your arrival time, please call [phone] between 1PM - 3PM on:03-19-24 Tuesday If your arrival time is 6:00 am, do not arrive before that time as the [facility] entrance doors do not open until 6:00 am.  REMEMBER: Instructions that are not followed completely may result in serious medical risk, up to and including death; or upon the discretion of your surgeon and anesthesiologist your surgery may need to be rescheduled.  Do not eat food after midnight the night before surgery.  No gum chewing or hard candies.  You may however, drink Water up to 2 hours before you are scheduled to arrive for your surgery. Do not drink anything within 2 hours of your scheduled arrival time.  One week prior to surgery:Last dose will be on 03-12-24 Tuesday Stop ANY OVER THE COUNTER supplements until after surgery (Melatonin)  Stop metFORMIN  (GLUCOPHAGE ) 2 days prior to surgery-Last dose will be on 03-17-24 Sunday  Stop tirzepatide  (MOUNJARO ) 7 days prior to surgery-Last dose will be on 12-7 25 Sunday-After the 03-10-24 dose, do NOT take again until AFTER surgery  Continue taking all of your other prescription medications up until the day of surgery.  Do NOT take any medication the day of surgery  No Alcohol for 24 hours before or after surgery.  No Smoking including e-cigarettes for 24 hours before surgery.  No chewable tobacco products for at least 6 hours before surgery.  No nicotine patches on the day of surgery.  Do not use any recreational drugs for at least a week (preferably 2 weeks) before your surgery.  Please be advised that the combination of cocaine and anesthesia may have negative outcomes, up to and including death. If you test positive for cocaine, your surgery will be cancelled.  On the morning of surgery  brush your teeth with toothpaste and water, you may rinse your mouth with mouthwash if you wish. Do not swallow any toothpaste or mouthwash.  Use CHG Soap as directed on instruction sheet.  Do not wear jewelry, make-up, hairpins, clips or nail polish.  For welded (permanent) jewelry: bracelets, anklets, waist bands, etc.  Please have this removed prior to surgery.  If it is not removed, there is a chance that hospital personnel will need to cut it off on the day of surgery.  Do not wear lotions, powders, or perfumes.   Do not shave body hair from the neck down 48 hours before surgery.  Contact lenses, hearing aids and dentures may not be worn into surgery.  Do not bring valuables to the hospital. [facility] is not responsible for any missing/lost belongings or valuables.    Notify your doctor if there is any change in your medical condition (cold, fever, infection).  Wear comfortable clothing (specific to your surgery type) to the hospital.  After surgery, you can help prevent lung complications by doing breathing exercises.  Take deep breaths and cough every 1-2 hours. Your doctor may order a device called an Incentive Spirometer to help you take deep breaths. When coughing or sneezing, hold a pillow firmly against your incision with both hands. This is called "splinting." Doing this helps protect your incision. It also decreases belly discomfort.  If you are being admitted to the hospital overnight, leave your suitcase in the car. After surgery it may be brought to your room.  In case of increased patient census, it may be necessary for you, the patient, to continue your postoperative care in the Same Day Surgery department.  If you are being discharged the day of surgery, you will not be allowed to drive home. You will need a responsible individual to drive you home and stay with you for 24 hours after surgery.   If you are taking public transportation, you will need to have a  responsible individual with you.  Please call the Pre-admissions Testing Dept. at [phone] if you have any questions about these instructions.  Surgery Visitation Policy:  Patients having surgery or a procedure may have two visitors.  Children under the age of 21 must have an adult with them who is not the patient.  Inpatient Visitation:    Visiting hours are 7 a.m. to 8 p.m. Up to four visitors are allowed at one time in a patient room. The visitors may rotate out with other people during the day.  One visitor age 80 or older may stay with the patient overnight and must be in the room by 8 p.m.    Pre-operative 4 CHG Bath Instructions   You can play a key role in reducing the risk of infection after surgery. Your skin needs to be as free of germs as possible. You can reduce the number of germs on your skin by washing with CHG (chlorhexidine  gluconate) soap before surgery. CHG is an antiseptic soap that kills germs and continues to kill germs even after washing.   DO NOT use if you have an allergy to chlorhexidine /CHG or antibacterial soaps. If your skin becomes reddened or irritated, stop using the CHG and notify one of our RNs at [phone].   Please shower with the CHG soap starting 4 days before surgery using the following schedule:     Please keep in mind the following:  DO NOT shave, including legs and underarms, starting the day of your first shower.   You may shave your face at any point before/day of surgery.  Place clean sheets on your bed the day you start using CHG soap. Use a clean washcloth (not used since being washed) for each shower. DO NOT sleep with pets once you start using the CHG.   CHG Shower Instructions:  If you choose to wash your hair and private area, wash first with your normal shampoo/soap.  After you use shampoo/soap, rinse your hair and body thoroughly to remove shampoo/soap residue.  Turn the water OFF and apply about 3 tablespoons (45 ml)  of CHG soap to a CLEAN washcloth.  Apply CHG soap ONLY FROM YOUR NECK DOWN TO YOUR TOES (washing for 3-5 minutes)  DO NOT use CHG soap on face, private areas, open wounds, or sores.  Pay special attention to the area where your surgery is being performed.  If you are having back surgery, having someone wash your back for you may be helpful. Wait 2 minutes after CHG soap is applied, then you may rinse off the CHG soap.  Pat dry with a clean towel  Put on clean clothes/pajamas   If you choose to wear lotion, please use ONLY the CHG-compatible lotions on the back of this paper.     Additional instructions for the day of surgery: DO NOT APPLY any lotions, deodorants, cologne, or perfumes.   Put on clean/comfortable clothes.  Brush your teeth.  Ask your nurse before applying any prescription medications to the skin.  CHG Compatible Lotions   Aveeno Moisturizing lotion  Cetaphil Moisturizing Cream  Cetaphil Moisturizing Lotion  Clairol Herbal Essence Moisturizing Lotion, Dry Skin  Clairol Herbal Essence Moisturizing Lotion, Extra Dry Skin  Clairol Herbal Essence Moisturizing Lotion, Normal Skin  Curel Age Defying Therapeutic Moisturizing Lotion with Alpha Hydroxy  Curel Extreme Care Body Lotion  Curel Soothing Hands Moisturizing Hand Lotion  Curel Therapeutic Moisturizing Cream, Fragrance-Free  Curel Therapeutic Moisturizing Lotion, Fragrance-Free  Curel Therapeutic Moisturizing Lotion, Original Formula  Eucerin Daily Replenishing Lotion  Eucerin Dry Skin Therapy Plus Alpha Hydroxy Crme  Eucerin Dry Skin Therapy Plus Alpha Hydroxy Lotion  Eucerin Original Crme  Eucerin Original Lotion  Eucerin Plus Crme Eucerin Plus Lotion  Eucerin TriLipid Replenishing Lotion  Keri Anti-Bacterial Hand Lotion  Keri Deep Conditioning Original Lotion Dry Skin Formula Softly Scented  Keri Deep Conditioning Original Lotion, Fragrance Free Sensitive Skin Formula  Keri Lotion Fast Absorbing  Fragrance Free Sensitive Skin Formula  Keri Lotion Fast Absorbing Softly Scented Dry Skin Formula  Keri Original Lotion  Keri Skin Renewal Lotion Keri Silky Smooth Lotion  Keri Silky Smooth Sensitive Skin Lotion  Nivea Body Creamy Conditioning Oil  Nivea Body Extra Enriched Lotion  Nivea Body Original Lotion  Nivea Body Sheer Moisturizing Lotion Nivea Crme  Nivea Skin Firming Lotion  NutraDerm 30 Skin Lotion  NutraDerm Skin Lotion  NutraDerm Therapeutic Skin Cream  NutraDerm Therapeutic Skin Lotion  ProShield Protective Hand Cream  Provon moisturizing lotion  How to Use an Incentive Spirometer An incentive spirometer is a tool that measures how well you are filling your lungs with each breath. Learning to take long, deep breaths using this tool can help you keep your lungs clear and active. This may help to reverse or lessen your chance of developing breathing (pulmonary) problems, especially infection. You may be asked to use a spirometer: After a surgery. If you have a lung problem or a history of smoking. After a long period of time when you have been unable to move or be active. If the spirometer includes an indicator to show the highest number that you have reached, your health care provider or respiratory therapist will help you set a goal. Keep a log of your progress as told by your health care provider. What are the risks? Breathing too quickly may cause dizziness or cause you to pass out. Take your time so you do not get dizzy or light-headed. If you are in pain, you may need to take pain medicine before doing incentive spirometry. It is harder to take a deep breath if you are having pain. How to use your incentive spirometer  Sit up on the edge of your bed or on a chair. Hold the incentive spirometer so that it is in an upright position. Before you use the spirometer, breathe out normally. Place the mouthpiece in your mouth. Make sure your lips are closed tightly around  it. Breathe in slowly and as deeply as you can through your mouth, causing the piston or the ball to rise toward the top of the chamber. Hold your breath for 3-5 seconds, or for as long as possible. If the spirometer includes a coach indicator, use this to guide you in breathing. Slow down your breathing if the indicator goes above the marked areas. Remove the mouthpiece from your mouth and breathe out normally. The piston or ball will return to the bottom of the chamber. Rest for a few seconds, then repeat the steps 10 or more times.  Take your time and take a few normal breaths between deep breaths so that you do not get dizzy or light-headed. Do this every 1-2 hours when you are awake. If the spirometer includes a goal marker to show the highest number you have reached (best effort), use this as a goal to work toward during each repetition. After each set of 10 deep breaths, cough a few times. This will help to make sure that your lungs are clear. If you have an incision on your chest or abdomen from surgery, place a pillow or a rolled-up towel firmly against the incision when you cough. This can help to reduce pain while taking deep breaths and coughing. General tips When you are able to get out of bed: Walk around often. Continue to take deep breaths and cough in order to clear your lungs. Keep using the incentive spirometer until your health care provider says it is okay to stop using it. If you have been in the hospital, you may be told to keep using the spirometer at home. Contact a health care provider if: You are having difficulty using the spirometer. You have trouble using the spirometer as often as instructed. Your pain medicine is not giving enough relief for you to use the spirometer as told. You have a fever. Get help right away if: You develop shortness of breath. You develop a cough with bloody mucus from the lungs. You have fluid or blood coming from an incision site after  you cough. Summary An incentive spirometer is a tool that can help you learn to take long, deep breaths to keep your lungs clear and active. You may be asked to use a spirometer after a surgery, if you have a lung problem or a history of smoking, or if you have been inactive for a long period of time. Use your incentive spirometer as instructed every 1-2 hours while you are awake. If you have an incision on your chest or abdomen, place a pillow or a rolled-up towel firmly against your incision when you cough. This will help to reduce pain. Get help right away if you have shortness of breath, you cough up bloody mucus, or blood comes from your incision when you cough. This information is not intended to replace advice given to you by your health care provider. Make sure you discuss any questions you have with your health care provider. Document Revised: 01/27/2023 Document Reviewed: 01/27/2023 Elsevier Patient Education  2024 [employer].   [employer] Directory to address health-related social needs:  [email or web address]

## 2024-03-08 ENCOUNTER — Telehealth: Payer: Self-pay

## 2024-03-08 ENCOUNTER — Inpatient Hospital Stay: Admission: RE | Admit: 2024-03-08 | Discharge: 2024-03-08 | Attending: Neurosurgery | Admitting: Neurosurgery

## 2024-03-08 ENCOUNTER — Other Ambulatory Visit: Payer: Self-pay

## 2024-03-08 VITALS — BP 133/74 | HR 77 | Resp 16 | Ht 66.0 in | Wt 162.7 lb

## 2024-03-08 DIAGNOSIS — M542 Cervicalgia: Secondary | ICD-10-CM

## 2024-03-08 DIAGNOSIS — E119 Type 2 diabetes mellitus without complications: Secondary | ICD-10-CM | POA: Diagnosis not present

## 2024-03-08 DIAGNOSIS — Z01812 Encounter for preprocedural laboratory examination: Secondary | ICD-10-CM

## 2024-03-08 DIAGNOSIS — R748 Abnormal levels of other serum enzymes: Secondary | ICD-10-CM | POA: Diagnosis not present

## 2024-03-08 DIAGNOSIS — I10 Essential (primary) hypertension: Secondary | ICD-10-CM | POA: Diagnosis not present

## 2024-03-08 DIAGNOSIS — M4312 Spondylolisthesis, cervical region: Secondary | ICD-10-CM

## 2024-03-08 DIAGNOSIS — M4802 Spinal stenosis, cervical region: Secondary | ICD-10-CM | POA: Diagnosis not present

## 2024-03-08 DIAGNOSIS — Z01818 Encounter for other preprocedural examination: Secondary | ICD-10-CM

## 2024-03-08 DIAGNOSIS — M5412 Radiculopathy, cervical region: Secondary | ICD-10-CM

## 2024-03-08 HISTORY — DX: Presence of external hearing-aid: Z97.4

## 2024-03-08 HISTORY — DX: Personal history of adenomatous and serrated colon polyps: Z86.0101

## 2024-03-08 HISTORY — DX: Fatty (change of) liver, not elsewhere classified: K76.0

## 2024-03-08 HISTORY — DX: Type 2 diabetes mellitus without complications: E11.9

## 2024-03-08 HISTORY — DX: Abnormal levels of other serum enzymes: R74.8

## 2024-03-08 HISTORY — DX: Spinal stenosis, cervical region: M48.02

## 2024-03-08 HISTORY — DX: Polyneuropathy, unspecified: G62.9

## 2024-03-08 LAB — URINALYSIS, COMPLETE (UACMP) WITH MICROSCOPIC
Bacteria, UA: NONE SEEN
Bilirubin Urine: NEGATIVE
Glucose, UA: NEGATIVE mg/dL
Hgb urine dipstick: NEGATIVE
Ketones, ur: NEGATIVE mg/dL
Leukocytes,Ua: NEGATIVE
Nitrite: NEGATIVE
Protein, ur: NEGATIVE mg/dL
RBC / HPF: 0 RBC/hpf (ref 0–5)
Specific Gravity, Urine: 1.02 (ref 1.005–1.030)
pH: 5 (ref 5.0–8.0)

## 2024-03-08 LAB — COMPREHENSIVE METABOLIC PANEL WITH GFR
ALT: 26 U/L (ref 0–44)
AST: 26 U/L (ref 15–41)
Albumin: 5.1 g/dL — ABNORMAL HIGH (ref 3.5–5.0)
Alkaline Phosphatase: 83 U/L (ref 38–126)
Anion gap: 12 (ref 5–15)
BUN: 11 mg/dL (ref 8–23)
CO2: 27 mmol/L (ref 22–32)
Calcium: 10.4 mg/dL — ABNORMAL HIGH (ref 8.9–10.3)
Chloride: 101 mmol/L (ref 98–111)
Creatinine, Ser: 0.54 mg/dL (ref 0.44–1.00)
GFR, Estimated: >60 mL/min (ref >60)
Glucose, Bld: 127 mg/dL — ABNORMAL HIGH (ref 70–99)
Potassium: 3.6 mmol/L (ref 3.5–5.1)
Sodium: 140 mmol/L (ref 135–145)
Total Bilirubin: 0.5 mg/dL (ref 0.0–1.2)
Total Protein: 8.2 g/dL — ABNORMAL HIGH (ref 6.5–8.1)

## 2024-03-08 LAB — CBC
HCT: 42.2 % (ref 36.0–46.0)
Hemoglobin: 14.5 g/dL (ref 12.0–15.0)
MCH: 29 pg (ref 26.0–34.0)
MCHC: 34.4 g/dL (ref 30.0–36.0)
MCV: 84.4 fL (ref 80.0–100.0)
Platelets: 251 K/uL (ref 150–400)
RBC: 5 MIL/uL (ref 3.87–5.11)
RDW: 12.9 % (ref 11.5–15.5)
WBC: 6.9 K/uL (ref 4.0–10.5)
nRBC: 0 % (ref 0.0–0.2)

## 2024-03-08 LAB — TYPE AND SCREEN
ABO/RH(D): A POS
Antibody Screen: NEGATIVE

## 2024-03-08 LAB — SURGICAL PCR SCREEN
MRSA, PCR: NEGATIVE
Staphylococcus aureus: NEGATIVE

## 2024-03-08 NOTE — Telephone Encounter (Signed)
 Per Dorise Pereyra, NP: he received a note from the PAT nurses stating: Infected tooth... (+) facial swelling... tooth extracted yesterday. On ABX now and surgery is not until the 17th. Just wanted to make sure you guys were aware.   Mychart message sent to patient to notify her that any dental infection would need to be completely resolved prior to surgery and to inquire if she has a follow up appointment with her dentist.

## 2024-03-11 ENCOUNTER — Telehealth: Payer: Self-pay | Admitting: Neurosurgery

## 2024-03-11 NOTE — Telephone Encounter (Signed)
 Patient also sent mychart message regarding [facility] appointment. See mychart message encounter

## 2024-03-11 NOTE — Telephone Encounter (Signed)
 Patient is calling because she had to cancel her appt with [facility] on 03/08/24.  There next available is on 12/12, but they told her it takes 12-14 days to do the measurements and she is concerned about her surgery date.  She can not take calls at work, so she said she will call back at 1:30 to talk to you.

## 2024-03-12 ENCOUNTER — Ambulatory Visit (HOSPITAL_COMMUNITY)
Admission: RE | Admit: 2024-03-12 | Discharge: 2024-03-12 | Disposition: A | Source: Ambulatory Visit | Attending: Cardiology | Admitting: Cardiology

## 2024-03-12 ENCOUNTER — Encounter (HOSPITAL_COMMUNITY)

## 2024-03-12 ENCOUNTER — Ambulatory Visit: Payer: Self-pay | Admitting: Family Medicine

## 2024-03-12 DIAGNOSIS — Z01818 Encounter for other preprocedural examination: Secondary | ICD-10-CM

## 2024-03-12 LAB — EXERCISE TOLERANCE TEST
Angina Index: 0
Base ST Depression (mm): 0 mm
Duke Treadmill Score: 6
Estimated workload: 7.4
Exercise duration (min): 6 min
Exercise duration (sec): 20 s
MPHR: 159 {beats}/min
Peak HR: 127 {beats}/min
Percent HR: 79 %
RPE: 18
Rest HR: 61 {beats}/min
ST Depression (mm): 0 mm

## 2024-03-13 NOTE — Telephone Encounter (Signed)
 Surgery clearance form faxed back to [facility] at [phone]. Form sent for scanning.

## 2024-03-15 ENCOUNTER — Encounter (HOSPITAL_COMMUNITY)

## 2024-03-18 ENCOUNTER — Telehealth: Payer: Self-pay

## 2024-03-18 NOTE — Telephone Encounter (Signed)
 Received a message stating that Claudia Gregory called saying [facility] called and said that they do not have the PRX card, and that if they do not get that then they will have to cancel her appointment for today.  Called [facility] in [facility], who said they do not have her prescription for a brace. Refaxed brace order to their clinic and attached it to a mychart message for the patient.

## 2024-03-18 NOTE — Telephone Encounter (Addendum)
 Addressed in a separate encounter. Order was sent to [facility] 02/27/24. Refaxed to [facility] in [facility] 03/18/24.

## 2024-03-19 DIAGNOSIS — M5412 Radiculopathy, cervical region: Secondary | ICD-10-CM | POA: Diagnosis not present

## 2024-03-19 MED ORDER — SODIUM CHLORIDE 0.9 % IV SOLN: INTRAVENOUS | Status: DC

## 2024-03-19 MED ORDER — ORAL CARE MOUTH RINSE: 15.0000 mL | Freq: Once | OROMUCOSAL | Status: AC

## 2024-03-19 MED ORDER — CHLORHEXIDINE GLUCONATE 0.12 % MT SOLN
15.0000 mL | Freq: Once | OROMUCOSAL | Status: AC
  Administered 2024-03-20: 07:00:00 15 mL via OROMUCOSAL

## 2024-03-20 ENCOUNTER — Inpatient Hospital Stay

## 2024-03-20 ENCOUNTER — Other Ambulatory Visit: Payer: Self-pay

## 2024-03-20 ENCOUNTER — Inpatient Hospital Stay
Admission: RE | Admit: 2024-03-20 | Discharge: 2024-03-22 | DRG: 473 | Disposition: A | Discharge status: Home / Self Care | Attending: Neurosurgery | Admitting: Neurosurgery

## 2024-03-20 ENCOUNTER — Encounter: Payer: Self-pay | Admitting: Urgent Care

## 2024-03-20 ENCOUNTER — Encounter: Payer: Self-pay | Admitting: Neurosurgery

## 2024-03-20 ENCOUNTER — Inpatient Hospital Stay: Payer: Self-pay | Admitting: Urgent Care

## 2024-03-20 ENCOUNTER — Encounter: Admission: RE | Disposition: A | Payer: Self-pay | Attending: Neurosurgery

## 2024-03-20 DIAGNOSIS — F319 Bipolar disorder, unspecified: Secondary | ICD-10-CM | POA: Diagnosis present

## 2024-03-20 DIAGNOSIS — E785 Hyperlipidemia, unspecified: Secondary | ICD-10-CM | POA: Diagnosis present

## 2024-03-20 DIAGNOSIS — Z7985 Long-term (current) use of injectable non-insulin antidiabetic drugs: Secondary | ICD-10-CM | POA: Diagnosis not present

## 2024-03-20 DIAGNOSIS — M4312 Spondylolisthesis, cervical region: Secondary | ICD-10-CM | POA: Diagnosis present

## 2024-03-20 DIAGNOSIS — Z79899 Other long term (current) drug therapy: Secondary | ICD-10-CM | POA: Diagnosis not present

## 2024-03-20 DIAGNOSIS — M4802 Spinal stenosis, cervical region: Secondary | ICD-10-CM | POA: Diagnosis not present

## 2024-03-20 DIAGNOSIS — Z8261 Family history of arthritis: Secondary | ICD-10-CM | POA: Diagnosis not present

## 2024-03-20 DIAGNOSIS — Z833 Family history of diabetes mellitus: Secondary | ICD-10-CM | POA: Diagnosis not present

## 2024-03-20 DIAGNOSIS — F419 Anxiety disorder, unspecified: Secondary | ICD-10-CM | POA: Diagnosis present

## 2024-03-20 DIAGNOSIS — Z01818 Encounter for other preprocedural examination: Secondary | ICD-10-CM

## 2024-03-20 DIAGNOSIS — M5412 Radiculopathy, cervical region: Principal | ICD-10-CM | POA: Diagnosis present

## 2024-03-20 DIAGNOSIS — E119 Type 2 diabetes mellitus without complications: Secondary | ICD-10-CM | POA: Diagnosis present

## 2024-03-20 DIAGNOSIS — I10 Essential (primary) hypertension: Secondary | ICD-10-CM | POA: Diagnosis not present

## 2024-03-20 DIAGNOSIS — Z7984 Long term (current) use of oral hypoglycemic drugs: Secondary | ICD-10-CM | POA: Diagnosis not present

## 2024-03-20 DIAGNOSIS — Z981 Arthrodesis status: Secondary | ICD-10-CM | POA: Diagnosis not present

## 2024-03-20 DIAGNOSIS — M542 Cervicalgia: Secondary | ICD-10-CM

## 2024-03-20 DIAGNOSIS — H9190 Unspecified hearing loss, unspecified ear: Secondary | ICD-10-CM | POA: Diagnosis present

## 2024-03-20 DIAGNOSIS — K219 Gastro-esophageal reflux disease without esophagitis: Secondary | ICD-10-CM | POA: Diagnosis present

## 2024-03-20 DIAGNOSIS — Z8249 Family history of ischemic heart disease and other diseases of the circulatory system: Secondary | ICD-10-CM

## 2024-03-20 HISTORY — PX: ANTERIOR CERVICAL DECOMPRESSION/DISCECTOMY FUSION 4 LEVELS: SHX5556

## 2024-03-20 LAB — GLUCOSE, CAPILLARY
Glucose-Capillary: 122 mg/dL — ABNORMAL HIGH (ref 70–99)
Glucose-Capillary: 158 mg/dL — ABNORMAL HIGH (ref 70–99)

## 2024-03-20 LAB — ABO/RH: ABO/RH(D): A POS

## 2024-03-20 SURGERY — ANTERIOR CERVICAL DECOMPRESSION/DISCECTOMY FUSION 4 LEVELS
Anesthesia: General | Site: Spine Cervical

## 2024-03-20 MED ORDER — SUCCINYLCHOLINE CHLORIDE 200 MG/10ML IV SOSY
PREFILLED_SYRINGE | INTRAVENOUS | Status: DC | PRN
  Administered 2024-03-20: 07:00:00 120 mg via INTRAVENOUS

## 2024-03-20 MED ORDER — SURGIFLO WITH THROMBIN (HEMOSTATIC MATRIX KIT) OPTIME
TOPICAL | Status: DC | PRN
  Administered 2024-03-20: 09:00:00 1

## 2024-03-20 MED ORDER — SODIUM CHLORIDE 0.9% FLUSH
3.0000 mL | Freq: Two times a day (BID) | INTRAVENOUS | Status: DC
  Administered 2024-03-20 – 2024-03-21 (×4): 3 mL via INTRAVENOUS

## 2024-03-20 MED ORDER — PROPOFOL 1000 MG/100ML IV EMUL
INTRAVENOUS | Status: AC
  Filled 2024-03-20: qty 100

## 2024-03-20 MED ORDER — MIDAZOLAM HCL (PF) 2 MG/2ML IJ SOLN
INTRAMUSCULAR | Status: DC | PRN
  Administered 2024-03-20: 07:00:00 2 mg via INTRAVENOUS

## 2024-03-20 MED ORDER — CHLORHEXIDINE GLUCONATE 0.12 % MT SOLN
OROMUCOSAL | Status: AC
  Filled 2024-03-20: qty 15

## 2024-03-20 MED ORDER — LACTATED RINGERS IV SOLN: INTRAVENOUS | Status: DC | PRN

## 2024-03-20 MED ORDER — FENTANYL CITRATE (PF) 100 MCG/2ML IJ SOLN
25.0000 ug | INTRAMUSCULAR | Status: DC | PRN
  Administered 2024-03-20 (×2): 50 ug via INTRAVENOUS

## 2024-03-20 MED ORDER — CEFAZOLIN SODIUM-DEXTROSE 2-4 GM/100ML-% IV SOLN
2.0000 g | Freq: Once | INTRAVENOUS | Status: AC
  Administered 2024-03-20: 07:00:00 2 g via INTRAVENOUS

## 2024-03-20 MED ORDER — SEVOFLURANE IN SOLN
RESPIRATORY_TRACT | Status: AC
  Filled 2024-03-20: qty 250

## 2024-03-20 MED ORDER — HYDROMORPHONE HCL 1 MG/ML IJ SOLN: 0.5000 mg | INTRAMUSCULAR | Status: DC | PRN

## 2024-03-20 MED ORDER — PHENYLEPHRINE HCL-NACL 20-0.9 MG/250ML-% IV SOLN
INTRAVENOUS | Status: AC
  Filled 2024-03-20: qty 250

## 2024-03-20 MED ORDER — SODIUM CHLORIDE 0.9 % IV SOLN: 250.0000 mL | INTRAVENOUS | Status: AC

## 2024-03-20 MED ORDER — GLYCOPYRROLATE 0.2 MG/ML IJ SOLN
INTRAMUSCULAR | Status: DC | PRN
  Administered 2024-03-20: 08:00:00 .2 mg via INTRAVENOUS

## 2024-03-20 MED ORDER — POLYETHYLENE GLYCOL 3350 17 G PO PACK
17.0000 g | PACK | Freq: Every day | ORAL | Status: DC | PRN
  Administered 2024-03-22: 17 g via ORAL
  Filled 2024-03-20: qty 1

## 2024-03-20 MED ORDER — HYDROMORPHONE HCL 1 MG/ML IJ SOLN
INTRAMUSCULAR | Status: DC | PRN
  Administered 2024-03-20 (×2): .5 mg via INTRAVENOUS

## 2024-03-20 MED ORDER — DEXAMETHASONE SODIUM PHOSPHATE 4 MG/ML IJ SOLN
4.0000 mg | Freq: Four times a day (QID) | INTRAMUSCULAR | Status: AC
  Filled 2024-03-20 (×4): qty 1

## 2024-03-20 MED ORDER — ACETAMINOPHEN 500 MG PO TABS
1000.0000 mg | ORAL_TABLET | Freq: Four times a day (QID) | ORAL | Status: AC
  Administered 2024-03-20 – 2024-03-21 (×4): 1000 mg via ORAL
  Filled 2024-03-20 (×4): qty 2

## 2024-03-20 MED ORDER — PHENYLEPHRINE HCL-NACL 20-0.9 MG/250ML-% IV SOLN
INTRAVENOUS | Status: DC | PRN
  Administered 2024-03-20: 08:00:00 40 ug/min via INTRAVENOUS

## 2024-03-20 MED ORDER — PHENOL 1.4 % MT LIQD
1.0000 | OROMUCOSAL | Status: DC | PRN
  Administered 2024-03-20: 15:00:00 1 via OROMUCOSAL
  Filled 2024-03-20 (×2): qty 177

## 2024-03-20 MED ORDER — BUPIVACAINE-EPINEPHRINE (PF) 0.5% -1:200000 IJ SOLN
INTRAMUSCULAR | Status: AC
  Filled 2024-03-20: qty 10

## 2024-03-20 MED ORDER — ONDANSETRON HCL 4 MG PO TABS: 4.0000 mg | ORAL_TABLET | Freq: Four times a day (QID) | ORAL | Status: DC | PRN

## 2024-03-20 MED ORDER — ACETAMINOPHEN 650 MG RE SUPP: 650.0000 mg | RECTAL | Status: DC | PRN

## 2024-03-20 MED ORDER — MELATONIN 5 MG PO TABS
5.0000 mg | ORAL_TABLET | Freq: Every day | ORAL | Status: DC
  Administered 2024-03-21: 22:00:00 5 mg via ORAL
  Filled 2024-03-20 (×2): qty 1

## 2024-03-20 MED ORDER — EPHEDRINE SULFATE-NACL 50-0.9 MG/10ML-% IV SOSY
PREFILLED_SYRINGE | INTRAVENOUS | Status: DC | PRN
  Administered 2024-03-20: 08:00:00 10 mg via INTRAVENOUS

## 2024-03-20 MED ORDER — ONDANSETRON HCL 4 MG/2ML IJ SOLN: 4.0000 mg | Freq: Four times a day (QID) | INTRAMUSCULAR | Status: DC | PRN

## 2024-03-20 MED ORDER — ONDANSETRON HCL 4 MG/2ML IJ SOLN
INTRAMUSCULAR | Status: AC
  Filled 2024-03-20: qty 2

## 2024-03-20 MED ORDER — POLYVINYL ALCOHOL 1.4 % OP SOLN: 1.0000 [drp] | Freq: Every day | OPHTHALMIC | Status: DC | PRN

## 2024-03-20 MED ORDER — PROPOFOL 10 MG/ML IV BOLUS
INTRAVENOUS | Status: DC | PRN
  Administered 2024-03-20: 07:00:00 150 mg via INTRAVENOUS

## 2024-03-20 MED ORDER — ROSUVASTATIN CALCIUM 20 MG PO TABS
20.0000 mg | ORAL_TABLET | Freq: Every day | ORAL | Status: DC
  Administered 2024-03-20 – 2024-03-21 (×2): 20 mg via ORAL
  Filled 2024-03-20 (×2): qty 1

## 2024-03-20 MED ORDER — ONDANSETRON HCL 4 MG/2ML IJ SOLN
INTRAMUSCULAR | Status: DC | PRN
  Administered 2024-03-20: 11:00:00 4 mg via INTRAVENOUS

## 2024-03-20 MED ORDER — FENTANYL CITRATE (PF) 100 MCG/2ML IJ SOLN
INTRAMUSCULAR | Status: AC
  Filled 2024-03-20: qty 2

## 2024-03-20 MED ORDER — METFORMIN HCL 500 MG PO TABS
500.0000 mg | ORAL_TABLET | Freq: Every day | ORAL | Status: DC
  Administered 2024-03-20 – 2024-03-21 (×2): 500 mg via ORAL
  Filled 2024-03-20 (×2): qty 1

## 2024-03-20 MED ORDER — OXYCODONE HCL 5 MG PO TABS
10.0000 mg | ORAL_TABLET | ORAL | Status: DC | PRN
  Administered 2024-03-20 (×2): 10 mg via ORAL
  Filled 2024-03-20 (×2): qty 2

## 2024-03-20 MED ORDER — DEXAMETHASONE SOD PHOSPHATE PF 10 MG/ML IJ SOLN
INTRAMUSCULAR | Status: DC | PRN
  Administered 2024-03-20: 08:00:00 10 mg via INTRAVENOUS

## 2024-03-20 MED ORDER — CELECOXIB 200 MG PO CAPS
200.0000 mg | ORAL_CAPSULE | Freq: Two times a day (BID) | ORAL | Status: DC
  Administered 2024-03-20 – 2024-03-21 (×4): 200 mg via ORAL
  Filled 2024-03-20 (×4): qty 1

## 2024-03-20 MED ORDER — LABETALOL HCL 5 MG/ML IV SOLN
INTRAVENOUS | Status: AC
  Filled 2024-03-20: qty 4

## 2024-03-20 MED ORDER — BISACODYL 5 MG PO TBEC: 5.0000 mg | DELAYED_RELEASE_TABLET | Freq: Every day | ORAL | Status: DC | PRN

## 2024-03-20 MED ORDER — HYDROMORPHONE HCL 1 MG/ML IJ SOLN
0.5000 mg | INTRAMUSCULAR | Status: DC | PRN
  Administered 2024-03-20: 19:00:00 0.5 mg via INTRAVENOUS
  Filled 2024-03-20: qty 0.5

## 2024-03-20 MED ORDER — 0.9 % SODIUM CHLORIDE (POUR BTL) OPTIME
TOPICAL | Status: DC | PRN
  Administered 2024-03-20: 09:00:00 500 mL

## 2024-03-20 MED ORDER — BUPIVACAINE-EPINEPHRINE (PF) 0.5% -1:200000 IJ SOLN
INTRAMUSCULAR | Status: DC | PRN
  Administered 2024-03-20: 08:00:00 10 mL

## 2024-03-20 MED ORDER — OXYCODONE HCL 5 MG PO TABS
ORAL_TABLET | ORAL | Status: AC
  Filled 2024-03-20: qty 1

## 2024-03-20 MED ORDER — OXYCODONE HCL 5 MG PO TABS
5.0000 mg | ORAL_TABLET | ORAL | Status: DC | PRN
  Administered 2024-03-21 – 2024-03-22 (×2): 5 mg via ORAL
  Filled 2024-03-20 (×3): qty 1

## 2024-03-20 MED ORDER — REMIFENTANIL HCL 1 MG IV SOLR
INTRAVENOUS | Status: AC
  Filled 2024-03-20: qty 1000

## 2024-03-20 MED ORDER — SODIUM CHLORIDE 0.9% FLUSH: 3.0000 mL | INTRAVENOUS | Status: DC | PRN

## 2024-03-20 MED ORDER — POTASSIUM CHLORIDE IN NACL 20-0.9 MEQ/L-% IV SOLN
INTRAVENOUS | Status: AC
  Filled 2024-03-20 (×3): qty 1000

## 2024-03-20 MED ORDER — LOSARTAN POTASSIUM-HCTZ 50-12.5 MG PO TABS: 1.0000 | ORAL_TABLET | Freq: Every day | ORAL | Status: DC

## 2024-03-20 MED ORDER — CEFAZOLIN SODIUM-DEXTROSE 2-4 GM/100ML-% IV SOLN
INTRAVENOUS | Status: AC
  Filled 2024-03-20: qty 100

## 2024-03-20 MED ORDER — SERTRALINE HCL 50 MG PO TABS
100.0000 mg | ORAL_TABLET | Freq: Every day | ORAL | Status: DC
  Administered 2024-03-20 – 2024-03-21 (×2): 100 mg via ORAL
  Filled 2024-03-20 (×2): qty 2

## 2024-03-20 MED ORDER — FENTANYL CITRATE (PF) 100 MCG/2ML IJ SOLN
INTRAMUSCULAR | Status: DC | PRN
  Administered 2024-03-20 (×2): 50 ug via INTRAVENOUS

## 2024-03-20 MED ORDER — ENOXAPARIN SODIUM 40 MG/0.4ML IJ SOSY
40.0000 mg | PREFILLED_SYRINGE | INTRAMUSCULAR | Status: DC
  Administered 2024-03-21 – 2024-03-22 (×2): 40 mg via SUBCUTANEOUS
  Filled 2024-03-20 (×2): qty 0.4

## 2024-03-20 MED ORDER — REMIFENTANIL HCL 1 MG IV SOLR
INTRAVENOUS | Status: DC | PRN
  Administered 2024-03-20: 08:00:00 .15 ug/kg/min via INTRAVENOUS

## 2024-03-20 MED ORDER — OXYCODONE HCL 5 MG/5ML PO SOLN: 5.0000 mg | Freq: Once | ORAL | Status: AC | PRN

## 2024-03-20 MED ORDER — HYDROCHLOROTHIAZIDE 12.5 MG PO TABS
12.5000 mg | ORAL_TABLET | Freq: Every day | ORAL | Status: DC
  Administered 2024-03-20 – 2024-03-21 (×2): 12.5 mg via ORAL
  Filled 2024-03-20 (×2): qty 1

## 2024-03-20 MED ORDER — MIDAZOLAM HCL 2 MG/2ML IJ SOLN
INTRAMUSCULAR | Status: AC
  Filled 2024-03-20: qty 2

## 2024-03-20 MED ORDER — DEXAMETHASONE 4 MG PO TABS
4.0000 mg | ORAL_TABLET | Freq: Four times a day (QID) | ORAL | Status: AC
  Administered 2024-03-20 – 2024-03-21 (×3): 4 mg via ORAL
  Filled 2024-03-20 (×4): qty 1

## 2024-03-20 MED ORDER — GLYCOPYRROLATE 0.2 MG/ML IJ SOLN
INTRAMUSCULAR | Status: AC
  Filled 2024-03-20: qty 1

## 2024-03-20 MED ORDER — LIDOCAINE HCL (CARDIAC) PF 100 MG/5ML IV SOSY
PREFILLED_SYRINGE | INTRAVENOUS | Status: DC | PRN
  Administered 2024-03-20: 07:00:00 60 mg via INTRAVENOUS

## 2024-03-20 MED ORDER — MENTHOL 3 MG MT LOZG
1.0000 | LOZENGE | OROMUCOSAL | Status: DC | PRN
  Filled 2024-03-20: qty 9

## 2024-03-20 MED ORDER — ACETAMINOPHEN 325 MG PO TABS
650.0000 mg | ORAL_TABLET | ORAL | Status: DC | PRN
  Administered 2024-03-21: 18:00:00 650 mg via ORAL
  Filled 2024-03-20: qty 2

## 2024-03-20 MED ORDER — METFORMIN HCL 500 MG PO TABS
1000.0000 mg | ORAL_TABLET | Freq: Every day | ORAL | Status: DC
  Administered 2024-03-21 – 2024-03-22 (×2): 1000 mg via ORAL
  Filled 2024-03-20 (×2): qty 2

## 2024-03-20 MED ORDER — LABETALOL HCL 5 MG/ML IV SOLN
10.0000 mg | Freq: Once | INTRAVENOUS | Status: AC
  Administered 2024-03-20: 11:00:00 10 mg via INTRAVENOUS

## 2024-03-20 MED ORDER — PROPOFOL 500 MG/50ML IV EMUL
INTRAVENOUS | Status: DC | PRN
  Administered 2024-03-20: 07:00:00 100 ug/kg/min via INTRAVENOUS

## 2024-03-20 MED ORDER — LOSARTAN POTASSIUM 50 MG PO TABS
50.0000 mg | ORAL_TABLET | Freq: Every day | ORAL | Status: DC
  Administered 2024-03-20 – 2024-03-21 (×2): 50 mg via ORAL
  Filled 2024-03-20 (×2): qty 1

## 2024-03-20 MED ORDER — OXYCODONE HCL 5 MG PO TABS
5.0000 mg | ORAL_TABLET | Freq: Once | ORAL | Status: AC | PRN
  Administered 2024-03-20: 12:00:00 5 mg via ORAL

## 2024-03-20 MED FILL — Fentanyl Citrate Preservative Free (PF) Inj 100 MCG/2ML: INTRAMUSCULAR | Qty: 2 | Status: AC

## 2024-03-20 MED FILL — Hydromorphone HCl Inj 1 MG/ML: INTRAMUSCULAR | Qty: 1 | Status: AC

## 2024-03-20 SURGICAL SUPPLY — 38 items
ALLOGRAFT BONE FIBER KORE 1CC (Bone Implant) IMPLANT
BASIN KIT SINGLE STR (MISCELLANEOUS) ×1 IMPLANT
BUR NEURO DRILL SOFT 3.0X3.8M (BURR) ×1 IMPLANT
DERMABOND ADVANCED .7 DNX12 (GAUZE/BANDAGES/DRESSINGS) ×1 IMPLANT
DRAIN CHANNEL JP 10F RND 20C F (MISCELLANEOUS) IMPLANT
DRAPE C ARM PK CFD 31 SPINE (DRAPES) ×1 IMPLANT
DRAPE LAPAROTOMY 77X122 PED (DRAPES) ×1 IMPLANT
DRAPE SPINE LEICA/WILD 54X150 (DRAPES) ×1 IMPLANT
DRSG TEGADERM 4X4.75 (GAUZE/BANDAGES/DRESSINGS) IMPLANT
ELECTRODE REM PT RTRN 9FT ADLT (ELECTROSURGICAL) ×1 IMPLANT
EVACUATOR SILICONE 100CC (DRAIN) IMPLANT
FEE INTRAOP CADWELL SUPPLY NCS (MISCELLANEOUS) IMPLANT
FEE INTRAOP MONITOR IMPULS NCS (MISCELLANEOUS) IMPLANT
GAUZE SPONGE 2X2 STRL 8-PLY (GAUZE/BANDAGES/DRESSINGS) IMPLANT
GLOVE BIOGEL PI IND STRL 6.5 (GLOVE) ×1 IMPLANT
GLOVE SURG SYN 6.5 PF PI (GLOVE) ×1 IMPLANT
GLOVE SURG SYN 8.5 PF PI (GLOVE) ×3 IMPLANT
GOWN SRG LRG LVL 4 IMPRV REINF (GOWNS) ×1 IMPLANT
GOWN SRG XL LVL 3 NONREINFORCE (GOWNS) ×1 IMPLANT
KIT TURNOVER KIT A (KITS) ×1 IMPLANT
MANIFOLD NEPTUNE II (INSTRUMENTS) ×1 IMPLANT
NS IRRIG 500ML POUR BTL (IV SOLUTION) ×1 IMPLANT
PACK LAMINECTOMY ARMC (PACKS) ×1 IMPLANT
PAD ARMBOARD POSITIONER FOAM (MISCELLANEOUS) ×2 IMPLANT
PIN CASPAR 14 (PIN) ×1 IMPLANT
PLATE ACP 2.1X74 4LVL (Plate) IMPLANT
SCREW ACP 3.5X17 S/D VARIA (Screw) IMPLANT
SPACER C HEDRON 12X14 6 7D (Spacer) IMPLANT
SPACER C HEDRON 12X14 7M 7D (Spacer) IMPLANT
SPONGE KITTNER 5P (MISCELLANEOUS) ×1 IMPLANT
STAPLER SKIN PROX 35W (STAPLE) IMPLANT
SURGIFLO W/THROMBIN 8M KIT (HEMOSTASIS) ×1 IMPLANT
SUT STRATA 3-0 15 PS-2 (SUTURE) ×1 IMPLANT
SUT VIC AB 3-0 SH 8-18 (SUTURE) ×1 IMPLANT
SUTURE EHLN 3-0 FS-10 30 BLK (SUTURE) IMPLANT
SYR 20ML LL LF (SYRINGE) ×1 IMPLANT
TAPE CLOTH 3X10 WHT NS LF (GAUZE/BANDAGES/DRESSINGS) ×2 IMPLANT
TRAP FLUID SMOKE EVACUATOR (MISCELLANEOUS) ×1 IMPLANT

## 2024-03-20 NOTE — Anesthesia Preprocedure Evaluation (Signed)
 Anesthesia Evaluation  Patient identified by MRN, date of birth, ID band Patient awake    Reviewed: Allergy & Precautions, NPO status , Patient's Chart, lab work & pertinent test results  Airway Mallampati: III  TM Distance: >3 FB Neck ROM: full    Dental  (+) Chipped   Pulmonary neg pulmonary ROS   Pulmonary exam normal        Cardiovascular hypertension, On Medications negative cardio ROS Normal cardiovascular exam     Neuro/Psych  PSYCHIATRIC DISORDERS Anxiety Depression Bipolar Disorder   negative neurological ROS     GI/Hepatic Neg liver ROS,GERD  Medicated and Controlled,,  Endo/Other  negative endocrine ROSdiabetes    Renal/GU      Musculoskeletal   Abdominal   Peds  Hematology negative hematology ROS (+)   Anesthesia Other Findings Past Medical History: No date: Anxiety No date: Arthritis No date: Bipolar 1 disorder (HCC) No date: Carpal tunnel syndrome of left wrist 10/31/2023: Cervical radiculopathy No date: Cervical stenosis of spine No date: Chest pain, atypical 12/21/2023: DDD (degenerative disc disease), cervical No date: Deaf No date: Depression No date: DM (diabetes mellitus), type 2 (HCC) No date: Does use hearing aid No date: Elevated liver enzymes No date: Fatty liver No date: Gallstones No date: GERD (gastroesophageal reflux disease) 08/08/2014: Hx of adenomatous polyp of colon No date: Hyperlipidemia No date: Hypertension 03/07/2024: Infected tooth     Comment:  Had tooth pulled and is now on Augmentin  daily No date: OBESITY No date: Peripheral neuropathy No date: Pneumonia  Past Surgical History: 2000: CHOLECYSTECTOMY 2016: COLONOSCOPY     Comment:  5 mm adenoma No date: POLYPECTOMY 1998: TUBAL LIGATION No date: UPPER GASTROINTESTINAL ENDOSCOPY  BMI    Body Mass Index: 26.95 kg/m      Reproductive/Obstetrics negative OB ROS                               Anesthesia Physical Anesthesia Plan  ASA: 2  Anesthesia Plan: General ETT   Post-op Pain Management:    Induction: Intravenous  PONV Risk Score and Plan: 3 and Ondansetron , Dexamethasone  and Midazolam   Airway Management Planned: Oral ETT  Additional Equipment:   Intra-op Plan:   Post-operative Plan: Extubation in OR  Informed Consent: I have reviewed the patients History and Physical, chart, labs and discussed the procedure including the risks, benefits and alternatives for the proposed anesthesia with the patient or authorized representative who has indicated his/her understanding and acceptance.     Dental Advisory Given and Interpreter used for interview  Plan Discussed with: Anesthesiologist, CRNA and Surgeon  Anesthesia Plan Comments: (Patient consented for risks of anesthesia including but not limited to:  - adverse reactions to medications - damage to eyes, teeth, lips or other oral mucosa - nerve damage due to positioning  - sore throat or hoarseness - Damage to heart, brain, nerves, lungs, other parts of body or loss of life  Patient voiced understanding and assent.)        Anesthesia Quick Evaluation

## 2024-03-20 NOTE — TOC CM/SW Note (Incomplete)
 Transition of Care (TOC) CM/SW Note

## 2024-03-20 NOTE — Op Note (Signed)
 Indications: [name] is a 63 y.o. female with M54.12 Cervical radiculopathy, M48.02 Cervical stenosis of spinal canal, M43.12 Spondylolisthesis of cervical region, M54.2 Cervicalgia   She failed conservative management  Findings: stenosis, successful decompression  Preoperative Diagnosis: M54.12 Cervical radiculopathy, M48.02 Cervical stenosis of spinal canal, M43.12 Spondylolisthesis of cervical region, M54.2 Cervicalgia  Postoperative Diagnosis: same   EBL: 100 ml IVF: see AR Drains: one Disposition: Extubated and Stable to PACU Complications: none  A foley catheter was placed.   Preoperative Note:     Preoperative Note:   Risks of surgery discussed include: infection, bleeding, stroke, coma, death, paralysis, CSF leak, nerve/spinal cord injury, numbness, tingling, weakness, complex regional pain syndrome, recurrent stenosis and/or disc herniation, vascular injury, development of instability, neck/back pain, need for further surgery, persistent symptoms, development of deformity, and the risks of anesthesia. The patient understood these risks and agreed to proceed.  Operative Note:   Procedure: 1. Anterior Cervical Discectomy C3-4 including bilateral foraminotomies and end plate preparation  2. Anterior Cervical Discectomy C4-5 including bilateral foraminotomies and end plate preparation  3. Anterior Cervical Discectomy C5-6 including bilateral foraminotomies and end plate preparation  4. Anterior Cervical Discectomy C6-7 including bilateral foraminotomies and end plate preparation 5. Anterior Spinal Instrumentation C3 to 7  6. Anterior arthrodesis from C3 to C7 with placement of biomechanical devices at C3-4, C4-5, C5-6, and C6-7 7. Use of the operative microscope 8. Use of intraoperative flouroscopy  PROCEDURE IN DETAIL: After obtaining informed consent, the patient taken to the operating room, placed in supine position, general anesthesia induced.  The patient  had a small shoulder roll placed behind their shoulders.  The patient received preop antibiotics and IV Decadron .  The patient had a neck incision outlined, was prepped and draped in usual sterile fashion. A timeout was performed. The incision was injected with local anesthetic.   An incision was opened, dissection taken down medial to the carotid artery and jugular vein, lateral to the trachea and esophagus.  The prevertebral fascia identified and a localizing x-ray demonstrated the correct level.  The longus colli were dissected laterally, and self-retaining retractors placed to open the operative field. The microscope was then brought into the field.    Caspar pins were placed into the vertebral bodies at C3 and C5. The distractor was then placed at C3-5, and the annuli at C3-4 and C4-5 were opened using a bovie.  Curettes and pituitary rongeurs used to remove the majority of disk, then the drill was used to remove the posterior osteophyte and begin the foraminotomies. The nerve hook was used to elevate the posterior longitudinal ligament, which was then removed with Kerrison rongeurs. The microblunt nerve hook could be passed out the foramen bilaterally at each level.   Meticulous hemostasis was obtained. A biomechanical device (Globus Hedron 7 mm height x 14 mm width by 12 mm depth) was placed at C3-4. A second biomechanical device (Globus Hedron 7 mm height x 14 mm width by 12 mm depth) was placed at C4-5. Each device had been filled with allograft for aid in arthrodesis.  The distractor was removed, and the [place]belt retractor repositioned. The pin at C3 was removed and bone wax was used for hemostasis. An additional pin was placed at C7.  The distractor was placed from C5-7, and the annuli at C5-6 and C6-7 were opened using a bovie.  Curettes and pituitary rongeurs used to remove the majority of disk, then the drill was used to remove the posterior osteophyte  and begin the foraminotomies. The nerve  hook was used to elevate the posterior longitudinal ligament, which was then removed with Kerrison rongeurs. The microblunt nerve hook could be passed out the foramen bilaterally at each level.   Meticulous hemostasis was obtained. A biomechanical device (Globus Hedron 6 mm height x 14 mm width by 12 mm depth) was placed at C5-6. A second biomechanical device (Globus Hedron 7 mm height x 14 mm width by 12 mm depth) was placed at C6-7. Each device had been filled with allograft for aid in arthrodesis.  The caspar distractor was removed and the pins at C5 and C7 were removed.  Bone wax was used for hemostasis.    A separate five segment, four level plate (74 mm Nuvasive ACP) was chosen.  Two screws placed in the vertebral bodies of all four segments, respectively making sure the screws were behind the locking mechanism.  Final AP and lateral radiographs were taken.   Please note that the plate is not inclusive to the biomechanical devices.  The anchoring mechanism of the plate is completely separate from the biomechanical devices.  With everything in good position, the wound was irrigated copiously with bacitracin-containing solution and meticulous hemostasis obtained.  Wound was closed in 2 layers using interrupted inverted 3-0 Vicryl sutures in the platysma and 3-0 monocryl in the skin.  The wound was dressed with dermabond, the head of bed at 30 degrees, taken to recovery room in stable condition.  No new postop neurological deficits were identified.  All counts were correct at the end of the case.  I performed the entire procedure with the assistance of Edsel Goods PA as an designer, television/film set. An assistant was required for this procedure due to the complexity.  The assistant provided assistance in tissue manipulation and suction, and was required for the successful and safe performance of the procedure. I performed the critical portions of the procedure.   Reeves Daisy MD

## 2024-03-20 NOTE — Interval H&P Note (Signed)
 History and Physical Interval Note:  03/20/2024 6:50 AM  Claudia Gregory  has presented today for surgery, with the diagnosis of M54.12 Cervical radiculopathy M48.02 Cervical stenosis of spinal canal M43.12 Spondylolisthesis of cervical region M54.2 Cervicalgia.  The various methods of treatment have been discussed with the patient and family. After consideration of risks, benefits and other options for treatment, the patient has consented to  Procedures with comments: ANTERIOR CERVICAL DECOMPRESSION/DISCECTOMY FUSION 4 LEVELS (N/A) - C3-7 Anterior Cervical Discectomy and Fusion as a surgical intervention.  The patient's history has been reviewed, patient examined, no change in status, stable for surgery.  I have reviewed the patient's chart and labs.  Questions were answered to the patient's satisfaction.    Heart sounds normal no MRG. Chest Clear to Auscultation Bilaterally.   Teja Costen

## 2024-03-20 NOTE — Transfer of Care (Signed)
 Immediate Anesthesia Transfer of Care Note  Patient: Claudia Gregory  Procedure(s) Performed: C3-7 Anterior Cervical Discectomy and Fusion (Spine Cervical)  Patient Location: PACU  Anesthesia Type:General  Level of Consciousness: awake and alert   Airway & Oxygen Therapy: Patient Spontanous Breathing and Patient connected to nasal cannula oxygen  Post-op Assessment: Report given to RN and Post -op Vital signs reviewed and stable  Post vital signs: Reviewed and stable  Last Vitals:  Vitals Value Taken Time  BP 190/92 03/20/24 10:56  Temp 37.1 C 03/20/24 10:50  Pulse 106 03/20/24 10:58  Resp 15 03/20/24 10:58  SpO2 100 % 03/20/24 10:58  Vitals shown include unfiled device data.  Last Pain:  Vitals:   03/20/24 0631  TempSrc: Temporal  PainSc: 6          Complications: No notable events documented.

## 2024-03-20 NOTE — Anesthesia Postprocedure Evaluation (Signed)
 Anesthesia Post Note  Patient: Claudia Gregory  Procedure(s) Performed: C3-7 Anterior Cervical Discectomy and Fusion (Spine Cervical)  Patient location during evaluation: PACU Anesthesia Type: General Level of consciousness: awake and alert Pain management: pain level controlled Vital Signs Assessment: post-procedure vital signs reviewed and stable Respiratory status: spontaneous breathing, nonlabored ventilation, respiratory function stable and patient connected to nasal cannula oxygen Cardiovascular status: blood pressure returned to baseline and stable Postop Assessment: no apparent nausea or vomiting Anesthetic complications: no   No notable events documented.   Last Vitals:  Vitals:   03/20/24 1140 03/20/24 1145  BP:  (!) 144/64  Pulse: 85 72  Resp: 11 (!) 9  Temp:  (!) 36.4 C  SpO2: 100% 99%    Last Pain:  Vitals:   03/20/24 1145  TempSrc:   PainSc: Asleep                 Debby Mines

## 2024-03-20 NOTE — Anesthesia Procedure Notes (Signed)
 Procedure Name: Intubation Date/Time: 03/20/2024 7:25 AM  Performed by: Niki Manus SAUNDERS, CRNAPre-anesthesia Checklist: Patient identified, Patient being monitored, Timeout performed, Emergency Drugs available and Suction available Patient Re-evaluated:Patient Re-evaluated prior to induction Oxygen Delivery Method: Circle system utilized Preoxygenation: Pre-oxygenation with 100% oxygen Induction Type: IV induction Ventilation: Mask ventilation without difficulty Laryngoscope Size: Mac and 3 Grade View: Grade I Tube type: Oral Tube size: 6.5 mm Number of attempts: 1 Airway Equipment and Method: Stylet Placement Confirmation: ETT inserted through vocal cords under direct vision, positive ETCO2 and breath sounds checked- equal and bilateral Secured at: 21 cm Tube secured with: Tape Dental Injury: Teeth and Oropharynx as per pre-operative assessment

## 2024-03-20 NOTE — Plan of Care (Signed)
   Problem: Education: Goal: Knowledge of General Education information will improve Description: Including pain rating scale, medication(s)/side effects and non-pharmacologic comfort measures Outcome: Progressing   Problem: Health Behavior/Discharge Planning: Goal: Ability to manage health-related needs will improve Outcome: Progressing   Problem: Clinical Measurements: Goal: Will remain free from infection Outcome: Progressing

## 2024-03-21 ENCOUNTER — Encounter: Payer: Self-pay | Admitting: Neurosurgery

## 2024-03-21 NOTE — Plan of Care (Signed)
  Problem: Education: Goal: Knowledge of General Education information will improve Description: Including pain rating scale, medication(s)/side effects and non-pharmacologic comfort measures Outcome: Progressing   Problem: Clinical Measurements: Goal: Ability to maintain clinical measurements within normal limits will improve Outcome: Progressing   Problem: Clinical Measurements: Goal: Will remain free from infection Outcome: Progressing   Problem: Nutrition: Goal: Adequate nutrition will be maintained Outcome: Progressing   

## 2024-03-21 NOTE — Plan of Care (Signed)

## 2024-03-21 NOTE — Evaluation (Signed)
 Physical Therapy Evaluation Patient Details Name: Claudia Gregory MRN: [id number] DOB: 1960-05-12 Today's Date: 03/21/2024  History of Present Illness  Ms. Gali is a pleasant 63 y.o. female with multilevel cervical stenosis with cervical radiculopathy and anterolisthesis, patient underwent  C3-7 ACDF with Dr. Clois on 03/20/24.  Clinical Impression  Co-tx performed with OT and PT services for safety.  Pt required interpreter services utilizing Diane [phone] on the iPad.  Pt upright in recliner upon arrival and is able to effectively communicate with therapist utilizing the interpreter.  Pt is noted 5/10 pain and reports that she is having some electrical pain at times, and unsure that it feels any better at this moment in time following the surgery.  Pt was able to ambulate and navigate around the obstacle within the room en route to the restroom to void.  Pt assisted by OT in the restroom and then returned to the recliner where all other needs and questions were answered.  Pt noted to be flushed during the walk and had redness of the cheeks and feeling hot, which pt was educated as a potential side effect of the anesthesia.  Pt given cool wet wash cloth and upon sitting, pt noted symptom response upon sitting.         If plan is discharge home, recommend the following: A little help with walking and/or transfers;A little help with bathing/dressing/bathroom;Help with stairs or ramp for entrance;Assist for transportation;Assistance with cooking/housework   Can travel by private vehicle        Equipment Recommendations Rolling walker (2 wheels);BSC/3in1  Recommendations for Other Services       Functional Status Assessment Patient has had a recent decline in their functional status and demonstrates the ability to make significant improvements in function in a reasonable and predictable amount of time.     Precautions / Restrictions Precautions Precautions: Cervical Precaution  Booklet Issued: Yes (comment) Recall of Precautions/Restrictions: Intact Restrictions Weight Bearing Restrictions Per Provider Order: No      Mobility  Bed Mobility               General bed mobility comments: NT, patient OOB pre/post tx    Transfers Overall transfer level: Needs assistance Equipment used: Rolling walker (2 wheels) Transfers: Sit to/from Stand Sit to Stand: Contact guard assist           General transfer comment: cues for hand placement    Ambulation/Gait Ambulation/Gait assistance: Modified independent (Device/Increase time) Gait Distance (Feet): 30 Feet Assistive device: Rolling walker (2 wheels) Gait Pattern/deviations: Step-through pattern Gait velocity: decreased     General Gait Details: pt able to safely navigate obstacles in the room and go to the restroom.  Stairs            Wheelchair Mobility     Tilt Bed    Modified Rankin (Stroke Patients Only)       Balance Overall balance assessment: Needs assistance Sitting-balance support: Feet supported, No upper extremity supported Sitting balance-Leahy Scale: Good     Standing balance support: Reliant on assistive device for balance, During functional activity Standing balance-Leahy Scale: Fair                               Pertinent Vitals/Pain Pain Assessment Pain Assessment: 0-10 Pain Score: 5  Pain Location: neck Pain Descriptors / Indicators: Aching Pain Intervention(s): Limited activity within patient's tolerance, Monitored during session, Premedicated before session, Repositioned  Home Living Family/patient expects to be discharged to:: Private residence Living Arrangements: Spouse/significant other Available Help at Discharge: Family;Available PRN/intermittently Type of Home: House         Home Layout: One level Home Equipment: None      Prior Function Prior Level of Function : Independent/Modified  Independent;Driving;Working/employed             Mobility Comments: independent ADLs Comments: independent     Extremity/Trunk Assessment   Upper Extremity Assessment Upper Extremity Assessment: Generalized weakness;Defer to OT evaluation    Lower Extremity Assessment Lower Extremity Assessment: Generalized weakness    Cervical / Trunk Assessment Cervical / Trunk Assessment: Neck Surgery  Communication   Communication Communication: No apparent difficulties Factors Affecting Communication: Hearing impaired    Cognition Arousal: Alert Behavior During Therapy: WFL for tasks assessed/performed                             Following commands: Intact       Cueing Cueing Techniques: Verbal cues     General Comments General comments (skin integrity, edema, etc.): educated on role of therapy, discharge planning on cervical spinal precautions    Exercises     Assessment/Plan    PT Assessment Patient needs continued PT services  PT Problem List Decreased strength;Decreased activity tolerance;Decreased balance;Decreased mobility;Decreased safety awareness;Decreased knowledge of use of DME       PT Treatment Interventions DME instruction;Gait training;Stair training;Functional mobility training;Therapeutic activities;Therapeutic exercise;Balance training;Neuromuscular re-education    PT Goals (Current goals can be found in the Care Plan section)  Acute Rehab PT Goals Patient Stated Goal: to return home. PT Goal Formulation: With patient Time For Goal Achievement: 04/04/24 Potential to Achieve Goals: Good    Frequency Min 2X/week     Co-evaluation   Reason for Co-Treatment: For patient/therapist safety;To address functional/ADL transfers PT goals addressed during session: Mobility/safety with mobility OT goals addressed during session: ADL's and self-care       AM-PAC PT 6 Clicks Mobility  Outcome Measure Help needed turning from your back to  your side while in a flat bed without using bedrails?: A Little Help needed moving from lying on your back to sitting on the side of a flat bed without using bedrails?: A Little Help needed moving to and from a bed to a chair (including a wheelchair)?: A Little Help needed standing up from a chair using your arms (e.g., wheelchair or bedside chair)?: A Little Help needed to walk in hospital room?: A Little Help needed climbing 3-5 steps with a railing? : A Little 6 Click Score: 18    End of Session Equipment Utilized During Treatment: Gait belt Activity Tolerance: Patient tolerated treatment well Patient left: in chair;with call bell/phone within reach;with family/visitor present Nurse Communication: Mobility status PT Visit Diagnosis: Other abnormalities of gait and mobility (R26.89);Muscle weakness (generalized) (M62.81);Difficulty in walking, not elsewhere classified (R26.2);Pain Pain - part of body:  (neck)    Time: 8654-8573 PT Time Calculation (min) (ACUTE ONLY): 41 min   Charges:   PT Evaluation $PT Eval Low Complexity: 1 Low PT Treatments $Therapeutic Activity: 8-22 mins PT General Charges $$ ACUTE PT VISIT: 1 Visit         Fonda Simpers, PT, DPT Physical Therapist - [facility]  03/21/2024, 4:25 PM

## 2024-03-21 NOTE — Progress Notes (Signed)
 Neurosurgery Progress Note  History: Claudia Gregory is here for cervical radiculopathy  POD1: Throat sore but able to swallow.  Intrascapular pain  Physical Exam: Vitals:   03/21/24 0451 03/21/24 0813  BP: (!) 145/67 139/73  Pulse: 69 64  Resp: 18 17  Temp: 97.9 F (36.6 C) 97.6 F (36.4 C)  SpO2: 95% 97%    AA Ox3 CNI  Strength:5/5 throughout BUE Neck soft  Data:  Other tests/results: Drain 75  Assessment/Plan:  Claudia Gregory is doing well after surgery  - mobilize - pain control - DVT prophylaxis - PTOT - Advance diet   Reeves Daisy MD, [facility] Department of Neurosurgery

## 2024-03-21 NOTE — Evaluation (Signed)
 Occupational Therapy Evaluation Patient Details Name: Claudia Gregory MRN: [id number] DOB: [birth year] Today's Date: 03/21/2024   History of Present Illness   Claudia Gregory is a pleasant 63 y.o. female with multilevel cervical stenosis with cervical radiculopathy and anterolisthesis, patient underwent  C3-7 ACDF with Dr. Clois on 03/20/24.     Clinical Impressions Patient was seen for OT evaluation this date. Prior to hospital admission, patient was active and independent. Patient lives in single story home with good support from spouse and adult son; they both work during the day but are able/willing to assist patient as able/needed. Patient has been hospitalized due to scheduled C3-C7 ACDF. OT/PT provided education on cervical spinal precautions with patient demonstrating understanding. Patient performing in room mobility with r/w with CGA, reports feeling flushed after toileting, BP 140/64 HR 67.  Patient presents with deficits in standing tolerance/balance, gross strength and flexibility, affecting safe and optimal ADL completion. Patient is currently requiring min A/CGA for LB self care tasks.  Paient would benefit from skilled OT services to address noted impairments and functional limitations (see below for any additional details) in order to maximize safety and independence while minimizing future risk of falls, injury, and readmission.  Anticipate the need for follow up OT services upon acute hospital DC.      If plan is discharge home, recommend the following:   A little help with walking and/or transfers;A little help with bathing/dressing/bathroom     Functional Status Assessment   Patient has had a recent decline in their functional status and demonstrates the ability to make significant improvements in function in a reasonable and predictable amount of time.     Equipment Recommendations   None recommended by OT     Recommendations for Other Services          Precautions/Restrictions   Precautions Precautions: Cervical Precaution Booklet Issued: Yes (comment) Recall of Precautions/Restrictions: Intact Restrictions Weight Bearing Restrictions Per Provider Order: No     Mobility Bed Mobility               General bed mobility comments: NT, patient OOB pre/post tx    Transfers Overall transfer level: Needs assistance Equipment used: Rolling walker (2 wheels) Transfers: Sit to/from Stand Sit to Stand: Contact guard assist           General transfer comment: cues for hand placement      Balance Overall balance assessment: Needs assistance Sitting-balance support: Feet supported, No upper extremity supported Sitting balance-Leahy Scale: Good     Standing balance support: Reliant on assistive device for balance, During functional activity Standing balance-Leahy Scale: Fair                             ADL either performed or assessed with clinical judgement   ADL Overall ADL's : Needs assistance/impaired     Grooming: Wash/dry hands;Contact guard assist;Standing               Lower Body Dressing: Minimal assistance;Sit to/from stand Lower Body Dressing Details (indicate cue type and reason): instructed on cervical precautions with LB self care tasks Toilet Transfer: Contact guard assist;Rolling walker (2 wheels);Regular Toilet   Toileting- Clothing Manipulation and Hygiene: Contact guard assist;Sit to/from stand       Functional mobility during ADLs: Contact guard assist General ADL Comments: instructed on how to manage ADLs while adhering to cervical spinal precautions with good return demo     Vision  Perception         Praxis         Pertinent Vitals/Pain Pain Assessment Pain Assessment: 0-10 Pain Score: 4  Pain Location: neck Pain Descriptors / Indicators: Aching Pain Intervention(s): Monitored during session     Extremity/Trunk Assessment Upper Extremity  Assessment Upper Extremity Assessment: Generalized weakness   Lower Extremity Assessment Lower Extremity Assessment: Defer to PT evaluation   Cervical / Trunk Assessment Cervical / Trunk Assessment: Neck Surgery   Communication Communication Communication: No apparent difficulties Factors Affecting Communication: Hearing impaired   Cognition Arousal: Alert Behavior During Therapy: WFL for tasks assessed/performed Cognition: No apparent impairments                               Following commands: Intact       Cueing  General Comments   Cueing Techniques: Verbal cues  educated on role of therapy, discharge planning on cervical spinal precautions   Exercises     Shoulder Instructions      Home Living Family/patient expects to be discharged to:: Private residence Living Arrangements: Spouse/significant other Available Help at Discharge: Family;Available PRN/intermittently Type of Home: House       Home Layout: One level     Bathroom Shower/Tub: Chief Strategy Officer: Standard Bathroom Accessibility: Yes How Accessible: Accessible via walker Home Equipment: None          Prior Functioning/Environment Prior Level of Function : Independent/Modified Independent;Driving;Working/employed             Mobility Comments: independent ADLs Comments: independent    OT Problem List: Decreased strength;Decreased activity tolerance   OT Treatment/Interventions: Self-care/ADL training;Therapeutic exercise;DME and/or AE instruction;Therapeutic activities      OT Goals(Current goals can be found in the care plan section)   Acute Rehab OT Goals Patient Stated Goal: to go home OT Goal Formulation: With patient Time For Goal Achievement: 04/04/24 Potential to Achieve Goals: Good ADL Goals Pt Will Perform Lower Body Bathing: with modified independence;sit to/from stand Pt Will Perform Lower Body Dressing: with modified independence;sit  to/from stand Pt Will Transfer to Toilet: with modified independence Pt Will Perform Tub/Shower Transfer: with supervision;tub bench   OT Frequency:  Min 2X/week    Co-evaluation PT/OT/SLP Co-Evaluation/Treatment: Yes Reason for Co-Treatment: For patient/therapist safety;To address functional/ADL transfers PT goals addressed during session: Mobility/safety with mobility OT goals addressed during session: ADL's and self-care      AM-PAC OT 6 Clicks Daily Activity     Outcome Measure Help from another person eating meals?: None Help from another person taking care of personal grooming?: None Help from another person toileting, which includes using toliet, bedpan, or urinal?: A Little Help from another person bathing (including washing, rinsing, drying)?: A Little Help from another person to put on and taking off regular upper body clothing?: A Little Help from another person to put on and taking off regular lower body clothing?: A Little 6 Click Score: 20   End of Session Equipment Utilized During Treatment: Rolling walker (2 wheels) Nurse Communication: Mobility status  Activity Tolerance: Patient tolerated treatment well Patient left: in chair;with call bell/phone within reach;with family/visitor present  OT Visit Diagnosis: Muscle weakness (generalized) (M62.81)                Time: 8654-8577 OT Time Calculation (min): 37 min Charges:  OT General Charges $OT Visit: 1 Visit OT Evaluation $OT Eval Low Complexity: 1 Low  OT Treatments $Self Care/Home Management : 8-22 mins  Rogers Clause, OT/L [facility], 03/21/2024

## 2024-03-22 ENCOUNTER — Other Ambulatory Visit: Payer: Self-pay

## 2024-03-22 MED ORDER — POLYETHYLENE GLYCOL 3350 17 GM/SCOOP PO POWD
17.0000 g | Freq: Every day | ORAL | 0 refills | Status: DC | PRN
  Filled 2024-03-22: qty 238, 14d supply, fill #0

## 2024-03-22 MED ORDER — METHYLPREDNISOLONE 4 MG PO TBPK
ORAL_TABLET | ORAL | 0 refills | Status: DC
  Filled 2024-03-22: qty 21, 6d supply, fill #0

## 2024-03-22 MED ORDER — CELECOXIB 200 MG PO CAPS
200.0000 mg | ORAL_CAPSULE | Freq: Two times a day (BID) | ORAL | 2 refills | Status: AC
  Filled 2024-03-22: qty 60, 30d supply, fill #0

## 2024-03-22 MED ORDER — OXYCODONE HCL 5 MG PO TABS
5.0000 mg | ORAL_TABLET | ORAL | 0 refills | Status: DC | PRN
  Filled 2024-03-22: qty 30, 4d supply, fill #0

## 2024-03-22 MED ORDER — METHOCARBAMOL 500 MG PO TABS
500.0000 mg | ORAL_TABLET | Freq: Four times a day (QID) | ORAL | 0 refills | Status: AC | PRN
  Filled 2024-03-22: qty 120, 30d supply, fill #0

## 2024-03-22 NOTE — Plan of Care (Signed)

## 2024-03-22 NOTE — Progress Notes (Signed)
 Neurosurgery Progress Note  History: Claudia Gregory is here for cervical radiculopathy  POD2: Sore throat as with yesterday.  Able to tolerate PO. POD1: Throat sore but able to swallow.  Intrascapular pain  Physical Exam: Vitals:   03/22/24 0438 03/22/24 0748  BP: (!) 159/81 (!) 157/83  Pulse: 60 62  Resp: 17 17  Temp: 97.7 F (36.5 C) 97.9 F (36.6 C)  SpO2: 98% 96%    AA Ox3 CNI  Strength:5/5 throughout BUE Neck soft  Data:  Other tests/results: Drain 30  Assessment/Plan:  Claudia Gregory is doing well after surgery  - mobilize - pain control - DVT prophylaxis - PTOT - continue steroids and transition to PO   Reeves Daisy MD, [facility] Department of Neurosurgery

## 2024-03-22 NOTE — Discharge Instructions (Addendum)
 " Your surgeon has performed an operation on your cervical spine (neck) to relieve pressure on the spinal cord and/or nerves. This involved making an incision in the front of your neck and removing one or more of the discs that support your spine. Next, a small piece of bone, a titanium plate, and screws were used to fuse two or more of the vertebrae (bones) together.  The following are instructions to help in your recovery once you have been discharged from the hospital. Even if you feel well, it is important that you follow these activity guidelines. If you do not let your neck heal properly from the surgery, you can increase the chance of return of your symptoms and other complications.  * Do not take anti-inflammatory medications for 3 months after surgery (naproxen  [Aleve ], ibuprofen [Advil, Motrin], etc.). These medications can prevent your bones from healing properly.  Celebrex , if prescribed, is ok to take.  *Regarding compression stockings-  Please wear day and night until you are walking a couple hundred feet three times a day.   Activity    No bending, lifting, or twisting (BLT). Avoid lifting objects heavier than 10 pounds (gallon milk jug).  Where possible, avoid household activities that involve lifting, bending, reaching, pushing, or pulling such as laundry, vacuuming, grocery shopping, and childcare. Try to arrange for help from friends and family for these activities while your back heals.  Increase physical activity slowly as tolerated.  Taking short walks is encouraged, but avoid strenuous exercise. Do not jog, run, bicycle, lift weights, or participate in any other exercises unless specifically allowed by your doctor.  Talk to your doctor before resuming sexual activity.  You should not drive until cleared by your doctor.  Until released by your doctor, you should not return to work or school.  You should rest at home and let your body heal.   You may shower three days  after your surgery.  After showering, lightly dab your incision dry. Do not take a tub bath or go swimming until approved by your doctor at your follow-up appointment.  If your doctor ordered a cervical collar (neck brace) for you, you should wear it whenever you are out of bed. You may remove it when lying down or sleeping, but you should wear it at all other times. Not all neck surgeries require a cervical collar.  If you smoke, we strongly recommend that you quit.  Smoking has been proven to interfere with normal bone healing and will dramatically reduce the success rate of your surgery. Please contact [facility] (800-QUIT-NOW) and use the resources at www.[facility].com for assistance in stopping smoking.  Surgical Incision   You have dermabond on your incision.  It is ok to remove after one week.  The dressing on your drain site can be removed tomorrow. Diet           You may return to your usual diet. However, you may experience discomfort when swallowing in the first month after your surgery. This is normal. You may find that softer foods are more comfortable for you to swallow. Be sure to stay hydrated.  When to Contact Us   You may experience pain in your neck and/or pain between your shoulder blades. This is normal and should improve in the next few weeks with the help of pain medication, muscle relaxers, and rest. Some patients report that a warm compress on the back of the neck or between the shoulder blades helps.  However, should you experience  any of the following, contact us  immediately: New numbness or weakness Pain that is progressively getting worse, and is not relieved by your pain medication, muscle relaxers, rest, and warm compresses Bleeding, redness, swelling, pain, or drainage from surgical incision Chills or flu-like symptoms Fever greater than 101.0 F (38.3 C) Inability to eat, drink fluids, or take medications Problems with bowel or bladder functions Difficulty  breathing or shortness of breath Warmth, tenderness, or swelling in your calf Contact Information How to contact us :  If you have any questions/concerns before or after surgery, you can reach us  at [phone], or you can send a mychart message. We can be reached by phone or mychart 8am-4pm, Monday-Friday.  *Please note: Calls after 4pm are forwarded to a third party answering service. Mychart messages are not routinely monitored during evenings, weekends, and holidays. Please call our office to contact the answering service for urgent concerns during non-business hours.    "

## 2024-03-22 NOTE — Discharge Summary (Addendum)
 Physician Discharge Summary  Patient ID: Claudia Gregory MRN: [id number] DOB/AGE: [birth year] 63 y.o.  Admit date: 03/20/2024 Discharge date: 03/22/2024  Admission Diagnoses: Principal Problem:   Cervical radiculopathy Active Problems:   Cervical stenosis of spinal canal   Cervicalgia   Spondylolisthesis of cervical region  Discharge Diagnoses:  Principal Problem:   Cervical radiculopathy Active Problems:   Cervical stenosis of spinal canal   Cervicalgia   Spondylolisthesis of cervical region   Discharged Condition: good  Hospital Course: Claudia Gregory was admitted for surgical intervention.  She tolerated the procedure well and was stable for discharge on POD2.  Consults: None  Significant Diagnostic Studies: radiology: X-Ray: Confirmation of location  Treatments: surgery: C3-7 ACDF  Discharge Exam: Blood pressure (!) 159/81, pulse 60, temperature 97.7 F (36.5 C), resp. rate 17, height 5' 6 (1.676 m), weight 75.8 kg, last menstrual period 10/06/2015, SpO2 98%. General appearance: alert and cooperative CNI MAEW  Disposition: Discharge disposition: 01-Home or Self Care       Discharge Instructions     Discharge patient   Complete by: As directed    Discharge disposition: 01-Home or Self Care   Discharge patient date: 03/22/2024   Incentive spirometry RT   Complete by: As directed       Allergies as of 03/22/2024   No Known Allergies      Medication List     STOP taking these medications    acetaminophen  500 MG tablet Commonly known as: TYLENOL    AMOXICILLIN  PO   naproxen  sodium 220 MG tablet Commonly known as: ALEVE    OVER THE COUNTER MEDICATION   OVER THE COUNTER MEDICATION       TAKE these medications    celecoxib  200 MG capsule Commonly known as: CELEBREX  Take 1 capsule (200 mg total) by mouth every 12 (twelve) hours.   DRY EYE RELIEF DROPS OP Place 1 drop into both eyes daily as needed (Dry eye). [employer] dry eye  advance   losartan -hydrochlorothiazide  50-12.5 MG tablet Commonly known as: HYZAAR Take 1 tablet by mouth daily.   Melatonin 10 MG Tabs Take 20-25 mg by mouth at bedtime. Gummies   metFORMIN  500 MG tablet Commonly known as: GLUCOPHAGE  Take 2 tablets (1,000 mg total) by mouth in the morning AND 1 tablet (500 mg total) every evening. What changed: See the new instructions.   methocarbamol  500 MG tablet Commonly known as: ROBAXIN  Take 1 tablet (500 mg total) by mouth every 6 (six) hours as needed for muscle spasms.   methylPREDNISolone  4 MG Tbpk tablet Commonly known as: MEDROL  DOSEPAK Take as directed on box   oxyCODONE  5 MG immediate release tablet Commonly known as: Oxy IR/ROXICODONE  Take 1 tablet (5 mg total) by mouth every 3 (three) hours as needed for moderate pain (pain score 4-6).   polyethylene glycol 17 g packet Commonly known as: MIRALAX  / GLYCOLAX  Take 17 g by mouth daily as needed for mild constipation.   rosuvastatin  20 MG tablet Commonly known as: CRESTOR  TAKE 1 TABLET BY MOUTH EVERY DAY   sertraline  100 MG tablet Commonly known as: ZOLOFT  Take 1 tablet (100 mg total) by mouth at bedtime.   sodium chloride  0.65 % Soln nasal spray Commonly known as: OCEAN Place 1 spray into both nostrils daily as needed for congestion.   tirzepatide  2.5 MG/0.5ML Pen Commonly known as: MOUNJARO  Inject 2.5 mg into the skin once a week. What changed: additional instructions         Signed: REEVES DAISY  03/22/2024, 7:28 AM

## 2024-03-25 ENCOUNTER — Telehealth: Payer: Self-pay

## 2024-03-25 NOTE — Transitions of Care (Post Inpatient/ED Visit) (Signed)
" ° °  03/25/2024  Name: Claudia Gregory MRN: [id number] DOB: 03-10-1961  Today's TOC FU Call Status: Today's TOC FU Call Status:: Unsuccessful Call (1st Attempt) Unsuccessful Call (1st Attempt) Date: 03/25/24  Attempted to reach the patient regarding the most recent Inpatient/ED visit.  Follow Up Plan: Additional outreach attempts will be made to reach the patient to complete the Transitions of Care (Post Inpatient/ED visit) call.   Medford Balboa, BSN, RN [place]  [facility] - Population Health RN Care Manager [phone]  "

## 2024-03-26 ENCOUNTER — Telehealth: Payer: Self-pay

## 2024-03-26 NOTE — Transitions of Care (Post Inpatient/ED Visit) (Signed)
 "  03/26/2024  Name: Claudia Gregory MRN: [id number] DOB: [birth year]  Today's TOC FU Call Status: Today's TOC FU Call Status:: Successful TOC FU Call Completed Unsuccessful Call (1st Attempt) Date: 03/25/24 [facility] FU Call Complete Date: 03/26/24  Patient's Name and Date of Birth confirmed. Name, DOB  Transition Care Management Follow-up Telephone Call Date of Discharge: 03/22/24 Discharge Facility: [facility]) Type of Discharge: Inpatient Admission Primary Inpatient Discharge Diagnosis:: Cervical Fusion How have you been since you were released from the hospital?: Better Any questions or concerns?: No  Items Reviewed: Did you receive and understand the discharge instructions provided?: Yes Medications obtained,verified, and reconciled?: Yes (Medications Reviewed) Any new allergies since your discharge?: No Dietary orders reviewed?: NA Do you have support at home?: Yes People in Home [RPT]: spouse Name of Support/Comfort Primary Source: Daril Slocumb  Medications Reviewed Today: Medications Reviewed Today     Reviewed by Moises Reusing, RN (Case Manager) on 03/26/24 at 1438  Med List Status: <None>   Medication Order Taking? Sig Documenting Provider Last Dose Status Informant  celecoxib  (CELEBREX ) 200 MG capsule [id number]  Take 1 capsule (200 mg total) by mouth every 12 (twelve) hours. Clois Fret, MD  Active   Glycerin-Hypromellose-PEG 400 (DRY EYE RELIEF DROPS OP) [id number]  Place 1 drop into both eyes daily as needed (Dry eye). [employer] dry eye advance [provider]  Active Self  losartan -hydrochlorothiazide  (HYZAAR) 50-12.5 MG tablet [id number]  Take 1 tablet by mouth daily. Copland, Harlene BROCKS, MD  Active Self  Melatonin 10 MG TABS [id number]  Take 20-25 mg by mouth at bedtime. Gummies [provider]  Active Self  metFORMIN  (GLUCOPHAGE ) 500 MG tablet [id number]  Take 2 tablets (1,000 mg total) by mouth in the morning  AND 1 tablet (500 mg total) every evening.  Patient taking differently: Take 2 tablets (1,000 mg total) by mouth in the morning with breakfast   Copland, Harlene BROCKS, MD  Active Self  methocarbamol  (ROBAXIN ) 500 MG tablet [id number]  Take 1 tablet (500 mg total) by mouth every 6 (six) hours as needed for muscle spasms. Clois Fret, MD  Active   methylPREDNISolone  (MEDROL  DOSEPAK) 4 MG TBPK tablet [id number] Yes Take as directed on box Clois Fret, MD  Active   oxyCODONE  (OXY IR/ROXICODONE ) 5 MG immediate release tablet [id number]  Take 1 tablet (5 mg total) by mouth every 3 (three) hours as needed for moderate pain (pain score 4-6). Clois Fret, MD  Active   polyethylene glycol powder (GLYCOLAX /MIRALAX ) 17 GM/SCOOP powder [id number]  Take 17 g by mouth daily as needed for mild constipation. Dissolve 1 capful (17g) in 4-8 ounces of liquid and take by mouth daily. Clois Fret, MD  Active   rosuvastatin  (CRESTOR ) 20 MG tablet [id number]  TAKE 1 TABLET BY MOUTH EVERY DAY Copland, Jessica C, MD  Active Self  sertraline  (ZOLOFT ) 100 MG tablet [id number]  Take 1 tablet (100 mg total) by mouth at bedtime. Copland, Harlene BROCKS, MD  Active Self  sodium chloride  (OCEAN) 0.65 % SOLN nasal spray [id number]  Place 1 spray into both nostrils daily as needed for congestion. [provider]  Active Self  tirzepatide  (MOUNJARO ) 2.5 MG/0.5ML Pen [id number]  Inject 2.5 mg into the skin once a week.  Patient taking differently: Inject 2.5 mg into the skin once a week. Sundays   Copland, Harlene BROCKS, MD  Active             Home Care and Equipment/Supplies:  Were Home Health Services Ordered?: No Any new equipment or medical supplies ordered?: No  Functional Questionnaire: Do you need assistance with bathing/showering or dressing?: No Do you need assistance with meal preparation?: No Do you need assistance with eating?: No Do you have difficulty maintaining continence: No Do you need  assistance with getting out of bed/getting out of a chair/moving?: No Do you have difficulty managing or taking your medications?: No  Follow up appointments reviewed: PCP Follow-up appointment confirmed?: Yes Date of PCP follow-up appointment?: 04/08/24 Follow-up Provider: [facility] Follow-up appointment confirmed?: Yes Date of Specialist follow-up appointment?: 04/03/24 Follow-Up Specialty Provider:: Harlene Boys Do you need transportation to your follow-up appointment?: No Do you understand care options if your condition(s) worsen?: Yes-patient verbalized understanding  SDOH Interventions Today    Flowsheet Row Most Recent Value  SDOH Interventions   Food Insecurity Interventions Intervention Not Indicated  Housing Interventions Intervention Not Indicated  Transportation Interventions Intervention Not Indicated  Utilities Interventions Intervention Not Indicated    Medford Balboa, BSN, RN [place]  [facility]BC[facility] - Population Health RN Care Manager [phone]  "

## 2024-03-26 NOTE — Transitions of Care (Post Inpatient/ED Visit) (Signed)
" ° °  03/26/2024  Name: Claudia Gregory MRN: [id number] DOB: 11-Jan-1961  Today's TOC FU Call Status: Today's TOC FU Call Status:: Unsuccessful Call (2nd Attempt) Unsuccessful Call (1st Attempt) Date: 03/25/24 Unsuccessful Call (2nd Attempt) Date: 03/26/24  Attempted to reach the patient regarding the most recent Inpatient/ED visit.  Follow Up Plan: Additional outreach attempts will be made to reach the patient to complete the Transitions of Care (Post Inpatient/ED visit) call.   Medford Balboa, BSN, RN [place]  [facility]BC[facility] - Population Health RN Care Manager [phone]  "

## 2024-03-31 NOTE — Progress Notes (Unsigned)
" ° °  REFERRING PHYSICIAN:  Watt Harlene BROCKS, Md [address] [place],  [place] [address]  ASL interpreter used during visit.***  DOS: 03/20/24  ACDF C3-C7   HISTORY OF PRESENT ILLNESS: Claudia Gregory is 2 weeks status post above surgery. Given celebrex , robaxin , oxycodone , and medrol  dose pack on discharge from the hospital.   Preop neck pain with electric sensation***   PHYSICAL EXAMINATION:  NEUROLOGICAL:  General: In no acute distress.   Awake, alert, oriented to person, place, and time.  Pupils equal round and reactive to light.  Facial tone is symmetric.    Strength: Side Biceps Triceps Deltoid Interossei Grip Wrist Ext. Wrist Flex.  R 5 5 5 5 5 5 5   L 5 5 5 5 5 5 5    Incision c/d/i  Imaging:  Nothing new to review.   Assessment / Plan: Claudia Gregory is doing well s/p above surgery. Treatment options reviewed with Gregory and following plan made:   - We discussed activity escalation and I have advised the Gregory to lift up to 10 pounds until 6 weeks after surgery (until follow up with Dr. Clois).   - Reviewed wound care.  - Continue current medications including *** - Follow up as scheduled in 4 weeks and prn.   Advised to contact the office if any questions or concerns arise.   Glade Boys PA-C Dept of Neurosurgery  "

## 2024-04-01 ENCOUNTER — Telehealth: Payer: Self-pay | Admitting: Neurosurgery

## 2024-04-01 NOTE — Telephone Encounter (Signed)
 Mark with [facility] is calling to request verbal PT orders of 1x2 and 2x3 starting 03/27/2024. Verbal given by me, as okay per Almarie Pouch.

## 2024-04-03 ENCOUNTER — Encounter: Payer: Self-pay | Admitting: Orthopedic Surgery

## 2024-04-03 ENCOUNTER — Ambulatory Visit

## 2024-04-03 ENCOUNTER — Ambulatory Visit: Admitting: Orthopedic Surgery

## 2024-04-03 ENCOUNTER — Other Ambulatory Visit

## 2024-04-03 VITALS — BP 136/80 | Temp 97.7°F | Ht 66.0 in | Wt 167.0 lb

## 2024-04-03 DIAGNOSIS — M4312 Spondylolisthesis, cervical region: Secondary | ICD-10-CM

## 2024-04-03 DIAGNOSIS — I6523 Occlusion and stenosis of bilateral carotid arteries: Secondary | ICD-10-CM | POA: Diagnosis not present

## 2024-04-03 DIAGNOSIS — M5412 Radiculopathy, cervical region: Secondary | ICD-10-CM

## 2024-04-03 DIAGNOSIS — K5900 Constipation, unspecified: Secondary | ICD-10-CM

## 2024-04-03 DIAGNOSIS — M47812 Spondylosis without myelopathy or radiculopathy, cervical region: Secondary | ICD-10-CM | POA: Diagnosis not present

## 2024-04-03 DIAGNOSIS — Z981 Arthrodesis status: Secondary | ICD-10-CM

## 2024-04-04 NOTE — Progress Notes (Signed)
 [facility] [address] [place], [place] [address] [phone]  Date:  04/08/2024   Name:  Claudia Gregory   DOB:  1960-07-14   MRN:  [id number]  PCP:  Watt Harlene BROCKS, MD    Chief Complaint: Hospitalization Follow-up (I've been having some headaches but I'm not sure why )   History of Present Illness:  Claudia Gregory is a 64 y.o. very pleasant female patient who presents with the following:  Patient seen today for follow-up after recent cervical fusion surgery I saw her for preoperative evaluation in December, she did an exercise tolerance test and did well. History of deafness, hypertension, GERD, hyperlipidemia, obesity, peripheral neuropathy, diabetes, fatty liver with elevation of LFT   She was admitted 12/17 through 12/19 and had a C3 through C7 ACDF She followed up in neurosurgery clinic on 12/31 and was found to be doing well.  She was not needing oxycodone : - We discussed activity escalation and I have advised the patient to lift up to 10 pounds until 6 weeks after surgery (until follow up with Dr. Clois).   - Reviewed wound care.  - She will continue to wear collar when up and walking.  - Postop constipation reviewed with Dr. Claudene. She will stop miralax  and get magnesium citrate. If she develops any nausea, vomiting, or abdominal pain, she will let us  know and go to ED.  - Will get STAT abdominal xrays today as well and  message her with results.  - Will message her on Friday to check on her progress.  - Follow up as scheduled in 4 weeks and prn.   Seen today with in person ASL interpreter  Discussed the use of AI scribe software for clinical note transcription with the patient, who gave verbal consent to proceed.  History of Present Illness Claudia Gregory is a 64 year old female who presents for post-operative follow-up after recent cervical spine surgery.  She is experiencing manageable  difficulty swallowing and eating due to wearing a cervical collar post-surgery. She wears the collar primarily during sleep and when sitting, but can remove it while eating. The collar is uncomfortable, and she can adjust the pneumatic strap to lessen the discomfort when eating.  She has soreness in her shoulders, which occurs occasionally, particularly when stressed. No numbness in her arms. She has stopped taking oxycodone  due to constipation, which lasted for almost two weeks. She was prescribed a medication and took milk of magnesium, which resolved the issue. She still has oxycodone  at home but is not taking it as she has not experienced significant pain recently.  She has started experiencing headaches in the front of her head, which began a few days ago. She is unsure if they are related to the soreness in her shoulders or the collar. The headaches are not severe and have occurred since her surgery. Tylenol  will relieve her sx  Noted mild hypercalcemia which she is willing to check on today    Patient Active Problem List   Diagnosis Date Noted   Cervical stenosis of spinal canal 03/20/2024   Cervicalgia 03/20/2024   Spondylolisthesis of cervical region 03/20/2024   Cervical radiculopathy 10/31/2023   Gross hematuria 04/28/2022   Dysuria 04/28/2022   Controlled type 2 diabetes mellitus without complication, without long-term current use of insulin (HCC) 07/11/2019   Generalized anxiety disorder 12/28/2015   Elevated liver enzymes 12/28/2015   Plantar fasciitis  of right foot 04/01/2015   Neuropathic pain of both feet 10/27/2014   Hx of adenomatous polyp of colon 08/08/2014   Deafness 08/04/2014   GERD (gastroesophageal reflux disease) 05/13/2014   Hyperlipidemia 02/05/2013   Hot flashes 11/24/2012   OBESITY 10/23/2008   Hypertension 10/23/2008   BIPOLAR DISORDER UNSPECIFIED 09/02/2008    Past Medical History:  Diagnosis Date   Anxiety    Arthritis    Bipolar 1 disorder (HCC)     Carpal tunnel syndrome of left wrist    Cervical radiculopathy 10/31/2023   Cervical stenosis of spine    Chest pain, atypical    DDD (degenerative disc disease), cervical 12/21/2023   Deaf    Depression    DM (diabetes mellitus), type 2 (HCC)    Does use hearing aid    Elevated liver enzymes    Fatty liver    Gallstones    GERD (gastroesophageal reflux disease)    Hx of adenomatous polyp of colon 08/08/2014   Hyperlipidemia    Hypertension    Infected tooth 03/07/2024   Had tooth pulled and is now on Augmentin  daily   OBESITY    Peripheral neuropathy    Pneumonia     Past Surgical History:  Procedure Laterality Date   ANTERIOR CERVICAL DECOMPRESSION/DISCECTOMY FUSION 4 LEVELS N/A 03/20/2024   Procedure: C3-7 Anterior Cervical Discectomy and Fusion;  Surgeon: Clois Fret, MD;  Location: [facility] ORS;  Service: Neurosurgery;  Laterality: N/A;  C3-7 Anterior Cervical Discectomy and Fusion   CHOLECYSTECTOMY  2000   COLONOSCOPY  2016   5 mm adenoma   POLYPECTOMY     TUBAL LIGATION  1998   UPPER GASTROINTESTINAL ENDOSCOPY      Social History[1]  Family History  Problem Relation Age of Onset   Brain cancer Mother    Hypertension Mother    Diabetes Mother    Anxiety disorder Mother    Arthritis Mother    Cancer Mother    Diabetes Father    Diabetes Maternal Aunt    Anxiety disorder Maternal Aunt    Heart attack Maternal Grandmother    Hypertension Maternal Grandmother    Colon cancer Neg Hx    Rectal cancer Neg Hx    Stomach cancer Neg Hx    Colon polyps Neg Hx    Esophageal cancer Neg Hx     Allergies[2]  Medication list has been reviewed and updated.  Medications Ordered Prior to Encounter[3]  Review of Systems:  As per HPI- otherwise negative.   Physical Examination: Vitals:   04/08/24 0908  BP: 136/84  Pulse: 63  SpO2: 99%   Vitals:   04/08/24 0908  Weight: 163 lb 6.4 oz (74.1 kg)  Height: 5' 6 (1.676 m)   Body mass index is 26.37  kg/m. Ideal Body Weight: Weight in (lb) to have BMI = 25: 154.6  GEN: no acute distress. Wearing a hard aspen collar today HEENT: Atraumatic, Normocephalic.  Ears and Nose: No external deformity. CV: RRR, No M/G/R. No JVD. No thrill. No extra heart sounds. PULM: CTA B, no wheezes, crackles, rhonchi. No retractions. No resp. distress. No accessory muscle use. ABD: S, NT, ND  EXTR: No c/c/e PSYCH: Normally interactive. Conversant.    Assessment and Plan: Hypercalcemia - Plan: PTH, intact (no Ca)  Assessment & Plan Postoperative care following cervical spine surgery Recovery progressing well with shoulder soreness, mild headaches possibly from collar use, and resolved constipation after discontinuing oxycodone . - Continue cervical collar use during sleep  and sitting. - Follow up with surgeon on January 27th.  Hypercalcemia Mildly elevated calcium  levels; hyperparathyroidism needs to be ruled out. - Ordered blood test for hyperparathyroidism evaluation.  Type 2 diabetes mellitus Slightly elevated blood sugar during hospitalization; recent A1c normal, indicating good control, currently in prediabetes range. - Continue current diabetes management plan.  See me in 4 months for routine recheck   General Health Maintenance Flu shot administered.  Signed Harlene Schroeder, MD     [1]  Social History Tobacco Use   Smoking status: Never   Smokeless tobacco: Never  Vaping Use   Vaping status: Never Used  Substance Use Topics   Alcohol  use: Not Currently    Alcohol /week: 2.0 standard drinks of alcohol     Types: 2 Glasses of wine per week    Comment: rare glass of wine    Drug use: No  [2] No Known Allergies [3]  Current Outpatient Medications on File Prior to Visit  Medication Sig Dispense Refill   celecoxib  (CELEBREX ) 200 MG capsule Take 1 capsule (200 mg total) by mouth every 12 (twelve) hours. 60 capsule 2   losartan -hydrochlorothiazide  (HYZAAR) 50-12.5 MG tablet Take 1  tablet by mouth daily. 90 tablet 3   Melatonin 10 MG TABS Take 20-25 mg by mouth at bedtime. Gummies     metFORMIN  (GLUCOPHAGE ) 500 MG tablet Take 2 tablets (1,000 mg total) by mouth in the morning AND 1 tablet (500 mg total) every evening. (Patient taking differently: Take 2 tablets (1,000 mg total) by mouth in the morning with breakfast) 270 tablet 3   methocarbamol  (ROBAXIN ) 500 MG tablet Take 1 tablet (500 mg total) by mouth every 6 (six) hours as needed for muscle spasms. 120 tablet 0   polyethylene glycol powder (GLYCOLAX /MIRALAX ) 17 GM/SCOOP powder Take 17 g by mouth daily as needed for mild constipation. Dissolve 1 capful (17g) in 4-8 ounces of liquid and take by mouth daily. 238 g 0   rosuvastatin  (CRESTOR ) 20 MG tablet TAKE 1 TABLET BY MOUTH EVERY DAY 90 tablet 3   sertraline  (ZOLOFT ) 100 MG tablet Take 1 tablet (100 mg total) by mouth at bedtime. 135 tablet 3   tirzepatide  (MOUNJARO ) 2.5 MG/0.5ML Pen Inject 2.5 mg into the skin once a week. (Patient taking differently: Inject 2.5 mg into the skin once a week. Sundays) 6 mL 2   No current facility-administered medications on file prior to visit.   "

## 2024-04-08 ENCOUNTER — Ambulatory Visit: Admitting: Family Medicine

## 2024-04-08 ENCOUNTER — Encounter: Payer: Self-pay | Admitting: Family Medicine

## 2024-04-10 ENCOUNTER — Ambulatory Visit: Payer: Self-pay | Admitting: Family Medicine

## 2024-04-11 LAB — TIQ-NTM

## 2024-04-11 LAB — PARATHYROID HORMONE, INTACT (NO CA): PTH: 38 pg/mL (ref 16–77)

## 2024-04-12 ENCOUNTER — Encounter: Payer: Self-pay | Admitting: Family Medicine

## 2024-04-17 NOTE — Telephone Encounter (Signed)
 DOS: 03/20/24  ACDF C3-C7

## 2024-04-23 ENCOUNTER — Other Ambulatory Visit: Payer: Self-pay | Admitting: Neurosurgery

## 2024-04-23 DIAGNOSIS — G959 Disease of spinal cord, unspecified: Secondary | ICD-10-CM

## 2024-04-30 ENCOUNTER — Other Ambulatory Visit

## 2024-04-30 ENCOUNTER — Ambulatory Visit

## 2024-04-30 ENCOUNTER — Encounter: Admitting: Neurosurgery

## 2024-04-30 ENCOUNTER — Encounter: Payer: Self-pay | Admitting: Neurosurgery

## 2024-04-30 ENCOUNTER — Ambulatory Visit: Admitting: Neurosurgery

## 2024-04-30 VITALS — BP 130/62 | Temp 97.8°F | Ht 66.0 in | Wt 156.5 lb

## 2024-04-30 DIAGNOSIS — G959 Disease of spinal cord, unspecified: Secondary | ICD-10-CM

## 2024-04-30 DIAGNOSIS — Z981 Arthrodesis status: Secondary | ICD-10-CM | POA: Diagnosis not present

## 2024-04-30 DIAGNOSIS — Z09 Encounter for follow-up examination after completed treatment for conditions other than malignant neoplasm: Secondary | ICD-10-CM | POA: Diagnosis not present

## 2024-04-30 DIAGNOSIS — M5412 Radiculopathy, cervical region: Secondary | ICD-10-CM

## 2024-04-30 NOTE — Progress Notes (Signed)
" ° °  REFERRING PHYSICIAN:  Watt Harlene BROCKS, Md [address] [place],  [place] [address]  ASL interpreter used during visit.  DOS: 03/20/24  ACDF C3-C7   HISTORY OF PRESENT ILLNESS: Claudia Gregory is status post above surgery.   She is doing reasonably well.  She does have some soreness in between her shoulder blades.   PHYSICAL EXAMINATION:  NEUROLOGICAL:  General: In no acute distress.   Awake, alert, oriented to person, place, and time.  Pupils equal round and reactive to light.  Facial tone is symmetric.    Strength: Side Biceps Triceps Deltoid Interossei Grip Wrist Ext. Wrist Flex.  R 5 5 5 5 5 5 5   L 5 5 5 5 5 5 5    Incision c/d/I  Imaging:  Cervical xrays dated 04/03/24:  No complications noted.   Today's x-rays are stable   Assessment / Plan: Claudia Gregory is doing well s/p above surgery.   We reviewed her activity limitations.  I will give her exercises for her neck.  We will see her back in clinic in 6 weeks.  She is okay to begin weaning off of her brace.   She works in [employer].  She is not ready to return to work  Claudia Daisy MD Dept of Neurosurgery  "

## 2024-05-09 ENCOUNTER — Encounter: Payer: Self-pay | Admitting: Family Medicine

## 2024-05-09 LAB — HM DIABETES EYE EXAM

## 2024-06-05 ENCOUNTER — Other Ambulatory Visit

## 2024-06-05 ENCOUNTER — Encounter: Admitting: Orthopedic Surgery
